# Patient Record
Sex: Female | Born: 1945
Health system: Southern US, Community
[De-identification: ages and names within clinical notes are randomized; demographics above are authoritative.]

## PROBLEM LIST (undated history)

## (undated) DIAGNOSIS — J449 Chronic obstructive pulmonary disease, unspecified: Secondary | ICD-10-CM

## (undated) DIAGNOSIS — M810 Age-related osteoporosis without current pathological fracture: Secondary | ICD-10-CM

## (undated) DIAGNOSIS — I10 Essential (primary) hypertension: Secondary | ICD-10-CM

## (undated) DIAGNOSIS — M26629 Arthralgia of temporomandibular joint, unspecified side: Secondary | ICD-10-CM

## (undated) HISTORY — DX: Age-related osteoporosis without current pathological fracture: M81.0

## (undated) HISTORY — DX: Essential (primary) hypertension: I10

## (undated) HISTORY — DX: Chronic obstructive pulmonary disease, unspecified: J44.9

## (undated) HISTORY — DX: Arthralgia of temporomandibular joint, unspecified side: M26.629

---

## 1998-08-21 ENCOUNTER — Other Ambulatory Visit: Admission: RE | Admit: 1998-08-21 | Discharge: 1998-08-21 | Payer: Self-pay

## 2005-02-18 ENCOUNTER — Other Ambulatory Visit: Admission: RE | Admit: 2005-02-18 | Discharge: 2005-02-18 | Payer: Self-pay | Admitting: Family Medicine

## 2006-03-30 ENCOUNTER — Ambulatory Visit: Payer: Self-pay | Admitting: Family Medicine

## 2006-04-01 ENCOUNTER — Ambulatory Visit: Payer: Self-pay | Admitting: Family Medicine

## 2006-04-21 ENCOUNTER — Ambulatory Visit: Payer: Self-pay | Admitting: Family Medicine

## 2006-04-23 ENCOUNTER — Ambulatory Visit: Payer: Self-pay | Admitting: Family Medicine

## 2006-05-18 ENCOUNTER — Ambulatory Visit: Payer: Self-pay | Admitting: Family Medicine

## 2006-06-25 ENCOUNTER — Ambulatory Visit: Payer: Self-pay | Admitting: Family Medicine

## 2006-07-23 ENCOUNTER — Ambulatory Visit: Payer: Self-pay | Admitting: Family Medicine

## 2006-08-31 ENCOUNTER — Ambulatory Visit: Payer: Self-pay | Admitting: Family Medicine

## 2006-10-01 ENCOUNTER — Ambulatory Visit: Payer: Self-pay | Admitting: Physician Assistant

## 2008-04-07 ENCOUNTER — Ambulatory Visit: Payer: Self-pay | Admitting: Cardiology

## 2008-04-18 ENCOUNTER — Ambulatory Visit: Payer: Self-pay | Admitting: Cardiology

## 2008-05-12 ENCOUNTER — Ambulatory Visit: Payer: Self-pay | Admitting: Cardiology

## 2011-04-08 NOTE — Assessment & Plan Note (Signed)
[facility]                          [facility]   NAME:[name]                     MRN:          [id number]  DATE:04/07/2008                            DOB:          [birth year]    REFERRING PHYSICIAN:  Delaney Meigs, M.D.   REQUESTING PHYSICIAN:  Dr. Delaney Meigs.   REASON FOR CONSULTATION:  Abnormal electrocardiogram and shortness of  breath.   HISTORY OF PRESENT ILLNESS:  Catherine Harrell is a pleasant 65 year old woman  with a reported history of chronic lung disease and longstanding history  of tobacco abuse.  She reports previous problems with allergies and also  recurrent pneumonia.  She apparently had lung function testing done a  few years ago, but nothing recently and reports being placed on  albuterol and Atrovent inhalers not that long ago, as well as receiving  a course of antibiotics.  She reports NYHA class II to, at times III,  dyspnea on exertion, but no chest tightness.  She has sporadic left  upper arm discomfort.  She does feel of a sense of relatively rapid  heart rate at rest, and this seems to be worse after she uses her  albuterol.  I do not have her actual electrocardiogram for review  prompting consultation, although a repeat tracing today shows sinus  tachycardia at 120 beats per minute with left atrial enlargement and  decreased R-wave progression, rule out anterior wall infarct.  Her heart  rate does actually settle down to around 100 after being seated for a  few minutes.  I see an old chest x-ray from 2007, revealing  hyperinflation, suggestive of obstructive pulmonary disease.  Non-  aneurysmal aortic calcification was also described on that study.  She  had some slight degree of apical pleural thickening as well.  She states  that she has not seen a pulmonologist.  She has had no dizziness, frank  orthopnea or PND.  She does have intermittent low extremity edema,  although states that  this is not particularly bothersome.  She has had  no other further cardiac evaluation.   ALLERGIES:  NO KNOWN DRUG ALLERGIES.   PRESENT MEDICATIONS:  Albuterol and Atrovent MDIs.   PAST MEDICAL HISTORY:  As outlined above.  The patient states that she  is disabled due to chronic lung disease.  She also has a history of  hearing loss and back problems.  She has been disabled since September  2006.   FAMILY HISTORY:  Was reviewed and is significant for cardiovascular  disease.  Also diabetes mellitus.   REVIEW OF SYSTEMS:  As outlined above.  She does have regular wheezing.  She is bothered by seasonal allergies.  She has occasional constipation  and arthric pain.  Otherwise systems are negative.   SOCIAL HISTORY:  The patient is married.  She has four children.  She  has a longstanding tobacco use history of 32 pack years, although quit  in September 2006.  Denies any alcohol use.  She states that she sleeps  on two pillows chronically.   PHYSICAL EXAMINATION:  VITAL SIGNS:  Blood  pressure 162/98, heart rate  is 112, down to 100.  Weight 131 pounds.  GENERAL:  This is a chronically ill appearing woman, looking older than  stated age.  HEENT:  Conjunctivae normal.  Oropharynx is clear.  NECK:  Supple.  No loud bruits or elevated jugulovenous pressure.  LUNGS:  Exhibit diminished breath sounds with end-expiratory wheezes and  prolonged expiratory phase.  Respiratory effort is not labored.  No use  of accessory muscles.  CARDIAC:  Reveals somewhat distant regular heart sounds.  Soft systolic  murmur.  There is a probable S4, although cannot exclude a summation  gallop with increased heart rate.  ABDOMEN:  Soft, no bruits.  No tenderness noted.  EXTREMITIES:  Exhibit trace edema.  Distal pulses are 1+.  SKIN:  Warm and dry.  MUSCULOSKELETAL:  No kyphosis is noted.  NEUROPSYCHIATRIC:  The patient is alert and oriented x3.  She has  significantly decreased hearing.  Affect is  appropriate.   IMPRESSION/RECOMMENDATIONS:  Longstanding dyspnea on exertion with  apparent chronic lung disease, presumably emphysema with a previous  longstanding tobacco use history.  This may well explain the patient's  poor R-wave progression on electrocardiogram, although the possibility  of coronary artery disease is also to be considered.  At this point, I  have recommended blood work, including a CBC and TSH, proceeding with  full pulmonary function tests to get a better sense of how significant  her lung disease is at baseline, obtain a PA and lateral chest x-ray,  and obtain a 2-D echocardiogram to assess left ventricular function and  exclude focal wall motion abnormalities.  We will then have her return  to the office to review these studies and discuss if any additional  evaluation is required.  Further plans to follow.     Jonelle Sidle, MD  Electronically Signed    SGM/MedQ  DD: 04/07/2008  DT: 04/07/2008  Job #: [id number]   cc:   Delaney Meigs, M.D.

## 2011-04-08 NOTE — Assessment & Plan Note (Signed)
[facility]                          [facility]   NAME:Catherine Harrell, Catherine Harrell                     MRN:          [id number]  DATE:05/12/2008                            DOB:          12-02-1945    PRIMARY CARE PHYSICIAN:  Delaney Meigs, M.D.   REASON FOR VISIT:  Followup testing.   HISTORY OF PRESENT ILLNESS:  I saw Ms. Noda back in May.  She has a  history of longstanding dyspnea on exertion of what appeared to be  fairly significant chronic lung disease based on her history and  previous substantial tobacco use.  She had an electrocardiogram showing  poor R-wave progression and we referred her for followup studies.  She  had an echocardiogram, which demonstrated normal left ventricular  systolic function with an ejection fraction of 60-65% and no wall motion  abnormalities.  I suspect that her decreased R-wave progression is  perhaps reflective of her underlying lung disease.  She does have  evidence of significant lung disease with pulmonary function test  demonstrating a severe obstructive lung defect.  She had a chest x-ray,  which was abnormal demonstrating an opacity in the lingula or superior  segment of left lower lobe and this was followed up with a CT scan of  the chest which revealed conglomerate scarring or bronchiectasis within  the lingula, which accounted for this opacity noted on the plain film.  There was bronchiectasis in the inferior right upper lobe and to a  lesser degree in the right middle lobe, minimally so in the left lower  lobe.  Findings were very consistent with chronic obstructive pulmonary  disease and emphysematous changes, but no acute process was noted.  Labs  were overall reassuring with normal BUN and creatinine of 8 and 0.8  respectively, normal TSH of 2.3, and a normal hemoglobin of 15.0.  I  discussed these with the patient today.  She continues to report  problems with congestion, although her  breathing has been better since I  last saw her.  We talked about referral for a formal followup with a  pulmonologist and she is in agreement.  I did not feel strongly about  pursuing any additional ischemic evaluation at this point.   ALLERGIES:  No known drug allergies.   PRESENT MEDICATIONS:  The patient uses ipratropium and albuterol p.r.n.  and also oxygen at night.   REVIEW OF SYSTEMS:  As per the history of present illness.  No dizziness  or syncope.   PHYSICAL EXAMINATION:  VITAL SIGNS:  Blood pressure is 146/98, heart  rate is 99, weight is 129 pounds, and oxygen saturation is 92% on room  air.  GENERAL:  The patient is in no acute distress.  Breathing is nonlabored.  NECK:  Reveals no elevated jugular venous pressure.  No loud bruits.  LUNGS:  With diminished breath sounds, slight expiratory wheezes, with  prolonged expiratory phase.  No use of accessory muscles.  CARDIAC:  Distant regular heart sounds, soft systolic murmur.  EXTREMITIES:  Exhibits trace edema with distal 1+ pulses.   IMPRESSION AND RECOMMENDATIONS:  Evidence  of chronic obstructive  pulmonary disease with a severe obstructive defect by pulmonary function  test.  She also has apparent scarring and bronchiectasis based on CT  scan findings as detailed above.  She has a longstanding history of  tobacco use and also previously worked in a [employer].  Fortunately,  her left ventricular ejection fraction is normal without wall motion  abnormalities and her blood work was largely reassuring.  I reviewed  these issues with the patient today and we will make a referral to Dr.  Orson Aloe to establish regular care of her chronic lung disease, which I  expect accounts for her dyspnea on exertion long-term.  At this  particular time, we did not plan any additional ischemic evaluation and  can see her back as needed.  It was mentioned in the CT scan report of  the chest that a follow up chest x-ray be considered in  3 months.  I  will leave this follow up to Dr. Donnamarie Poag discretion given his  expertise in this area.     Jonelle Sidle, MD  Electronically Signed    SGM/MedQ  DD: 05/12/2008  DT: 05/13/2008  Job #: [id number]   cc:   Delaney Meigs, M.D.

## 2014-01-09 ENCOUNTER — Institutional Professional Consult (permissible substitution): Payer: Self-pay | Admitting: Pulmonary Disease

## 2014-01-16 ENCOUNTER — Telehealth: Payer: Self-pay

## 2014-01-16 ENCOUNTER — Ambulatory Visit (INDEPENDENT_AMBULATORY_CARE_PROVIDER_SITE_OTHER): Payer: Commercial Managed Care - HMO | Admitting: Pulmonary Disease

## 2014-01-16 ENCOUNTER — Ambulatory Visit (INDEPENDENT_AMBULATORY_CARE_PROVIDER_SITE_OTHER)
Admission: RE | Admit: 2014-01-16 | Discharge: 2014-01-16 | Disposition: A | Discharge status: Home / Self Care | Payer: Commercial Managed Care - HMO | Source: Ambulatory Visit | Attending: Pulmonary Disease | Admitting: Pulmonary Disease

## 2014-01-16 ENCOUNTER — Encounter: Payer: Self-pay | Admitting: Pulmonary Disease

## 2014-01-16 VITALS — BP 142/84 | HR 112 | Ht 63.5 in | Wt 123.0 lb

## 2014-01-16 DIAGNOSIS — R0602 Shortness of breath: Secondary | ICD-10-CM

## 2014-01-16 DIAGNOSIS — R918 Other nonspecific abnormal finding of lung field: Secondary | ICD-10-CM

## 2014-01-16 DIAGNOSIS — J189 Pneumonia, unspecified organism: Secondary | ICD-10-CM

## 2014-01-16 DIAGNOSIS — J449 Chronic obstructive pulmonary disease, unspecified: Secondary | ICD-10-CM | POA: Diagnosis not present

## 2014-01-16 DIAGNOSIS — J4489 Other specified chronic obstructive pulmonary disease: Secondary | ICD-10-CM | POA: Diagnosis not present

## 2014-01-16 DIAGNOSIS — R9389 Abnormal findings on diagnostic imaging of other specified body structures: Secondary | ICD-10-CM

## 2014-01-16 NOTE — Telephone Encounter (Signed)
Spoke with patient-she is aware of results and aware of CT Chest ordered.

## 2014-01-16 NOTE — Telephone Encounter (Signed)
LMTCB X1, ct chest has been ordered.

## 2014-01-16 NOTE — Telephone Encounter (Signed)
Pt returned call.  Pt is hard of hearing.  You will need to speak up when talking w/ her.  Catherine FairyHolly D Pryor

## 2014-01-16 NOTE — Assessment & Plan Note (Signed)
I am concerned about this chest x-ray.  It's not clear to me if this finding is due to a recent pneumonia, pulmonary fibrosis, or a confluence of pulmonary nodules.  Given her smoking history, we need to better image these with a CT chest.  Plan: -CT Chest

## 2014-01-16 NOTE — Telephone Encounter (Signed)
Message copied by Velvet BatheAULFIELD, Lleyton Byers L on Mon Jan 16, 2014  4:45 PM ------      Message from: Max FickleMCQUAID, DOUGLAS B      Created: Mon Jan 16, 2014  4:40 PM       A,            Please let her know that the abnormalities on her chest X-ray have not improved and she now needs to have a CT chest.            Thanks      B ------

## 2014-01-16 NOTE — Progress Notes (Signed)
Subjective:    Patient ID: Catherine Harrell, female    DOB: [birth year], 68 y.o.   MRN: [id number]  HPI  01/16/2014> [name] was hospitalized the first week of January and was sent home with prednisone and the Levaquin.  Unfortunately she had swelling in her feet and ankles which she attributed to her medicine.  She continues to have some pain.    She was hospitalized due to anorexia, cough, and dyspnea. She received breathing treatments which didn't help too much.  She was hospitalized for about 4 days in [place] for a COPD flare.    She was diagnosed in 2006 which was the same year she quit smoking.  She has been on oxygen ever since leaving the hospital in January 2015.    For several years she has been having more trouble with dyspnea.  She occasionally chokes on food which makes her cough.    Since starting atravent she feels better.  She can walk about 200 yards before getting dyspnic.  She coughs fairly regularly, usually dark to yellow sputum.  She coughed up a little blood after her hospitalization in January.  None since.  No past medical history on file.   Family History  Problem Relation Age of Onset  . Heart disease Father   . Heart disease Sister   . Heart disease Brother   . Cancer Daughter     breast     History   Social History  . Marital Status: Unknown    Spouse Name: N/A    Number of Children: N/A  . Years of Education: N/A   Occupational History  . Not on file.   Social History Main Topics  . Smoking status: Former Smoker -- 1.00 packs/day for 30 years    Types: Cigarettes    Quit date: 01/16/2005  . Smokeless tobacco: Never Used  . Alcohol Use: No  . Drug Use: No  . Sexual Activity: Not on file   Other Topics Concern  . Not on file   Social History Narrative  . No narrative on file     Not on File   No outpatient prescriptions prior to visit.   No facility-administered medications prior to visit.      Review of Systems   Constitutional: Positive for unexpected weight change. Negative for fever.  HENT: Positive for sinus pressure. Negative for congestion, dental problem, ear pain, nosebleeds, postnasal drip, rhinorrhea, sneezing, sore throat and trouble swallowing.   Eyes: Negative for redness and itching.  Respiratory: Positive for cough and shortness of breath. Negative for chest tightness and wheezing.   Cardiovascular: Negative for palpitations and leg swelling.  Gastrointestinal: Negative for nausea and vomiting.  Genitourinary: Negative for dysuria.  Musculoskeletal: Positive for joint swelling.  Skin: Negative for rash.  Neurological: Negative for headaches.  Hematological: Does not bruise/bleed easily.  Psychiatric/Behavioral: Negative for dysphoric mood. The patient is not nervous/anxious.        Objective:   Physical Exam  Filed Vitals:   01/16/14 1419  BP: 142/84  Pulse: 112  Height: 5' 3.5" (1.613 m)  Weight: 123 lb (55.792 kg)  SpO2: 94%  2L [place]  Gen: chronically ill appearing, no acute distress HEENT: NCAT, PERRL, EOMi, OP clear, neck supple without masses PULM: few wheezes in bases CV: RRR, slight systolic murmur, no JVD AB: BS+, soft, nontender, no hsm Ext: warm, no edema, no clubbing, no cyanosis Derm: no rash or skin breakdown Neuro: A&Ox4, CN II-XII intact, strength 5/5 in  all 4 extremities  01/16/2014 CXR > multiple pulmonary nodules vs pulmonary fibrosis in upper lobes bilaterally      Assessment & Plan:   COPD (chronic obstructive pulmonary disease) COPD: GOLD Stage 3, GOLD Grade D Combined recommendations from the [employer], [employer] College of Chest Physicians, Designer, television/film setAmerican Thoracic Society, European Respiratory Society (Qaseem A et al, Ann Intern Med. 2011;155(3):179) recommends tobacco cessation, pulmonary rehab (for symptomatic patients with an FEV1 < 50% predicted), supplemental oxygen (for patients with SaO2 <88% or paO2 <55), and appropriate  bronchodilator therapy.  In regards to long acting bronchodilators, they recommend monotherapy (FEV1 60-80% with symptoms weak evidence, FEV1 with symptoms <60% strong evidence), or combination therapy (FEV1 <60% with symptoms, strong recommendation, moderate evidence).  One should also provide patients with annual immunizations and consider therapy for prevention of COPD exacerbations (ie. roflumilast or azithromycin) when appopriate.  -O2 therapy: Continue 2 L continuously -Immunizations: flu, pneumonia given 2014 -Tobacco use: quit 2006 -Exercise: encouraged regular exercise -Bronchodilator therapy: Add Breo, continue prn albuterol/atrovent -Exacerbation prevention: exercise   Abnormal CXR I am concerned about this chest x-ray.  It's not clear to me if this finding is due to a recent pneumonia, pulmonary fibrosis, or a confluence of pulmonary nodules.  Given her smoking history, we need to better image these with a CT chest.  Plan: -CT Chest    Updated Medication List Outpatient Encounter Prescriptions as of 01/16/2014  Medication Sig  . ipratropium (ATROVENT HFA) 17 MCG/ACT inhaler Inhale 2 puffs into the lungs every 6 (six) hours.  Marland Kitchen. ipratropium (ATROVENT) 0.02 % nebulizer solution Take 0.5 mg by nebulization 2 (two) times daily.  Docia Barrier. OXYGEN-HELIUM IN Inhale into the lungs. 2LPM 24/7

## 2014-01-16 NOTE — Assessment & Plan Note (Addendum)
COPD: GOLD Stage 3, GOLD Grade D Combined recommendations from the [employer] College of Physicians, [employer] College of Chest Physicians, Designer, television/film setAmerican Thoracic Society, European Respiratory Society (Qaseem A et al, Ann Intern Med. 2011;155(3):179) recommends tobacco cessation, pulmonary rehab (for symptomatic patients with an FEV1 < 50% predicted), supplemental oxygen (for patients with SaO2 <88% or paO2 <55), and appropriate bronchodilator therapy.  In regards to long acting bronchodilators, they recommend monotherapy (FEV1 60-80% with symptoms weak evidence, FEV1 with symptoms <60% strong evidence), or combination therapy (FEV1 <60% with symptoms, strong recommendation, moderate evidence).  One should also provide patients with annual immunizations and consider therapy for prevention of COPD exacerbations (ie. roflumilast or azithromycin) when appopriate.  -O2 therapy: Continue 2 L continuously -Immunizations: flu, pneumonia given 2014 -Tobacco use: quit 2006 -Exercise: encouraged regular exercise -Bronchodilator therapy: Add Breo, continue prn albuterol/atrovent -Exacerbation prevention: exercise

## 2014-01-16 NOTE — Patient Instructions (Addendum)
Take the Breo once a day no matter how you feel Keep using your other inhalers and oxygen as you are doing We will get a chest x-ray today to follow up the scarring in your lung We will see you back in 4 weeks or sooner if needed

## 2014-01-23 ENCOUNTER — Telehealth: Payer: Self-pay | Admitting: Pulmonary Disease

## 2014-01-23 MED ORDER — FLUTICASONE FUROATE-VILANTEROL 100-25 MCG/INH IN AEPB: 1.0000 | INHALATION_SPRAY | Freq: Every day | RESPIRATORY_TRACT | Status: DC

## 2014-01-23 NOTE — Telephone Encounter (Signed)
Patient needs her breo called in.  A sample was given at her last visit.  I have called this in.  Nothing further needed.

## 2014-01-26 ENCOUNTER — Ambulatory Visit (INDEPENDENT_AMBULATORY_CARE_PROVIDER_SITE_OTHER)
Admission: RE | Admit: 2014-01-26 | Discharge: 2014-01-26 | Disposition: A | Discharge status: Home / Self Care | Payer: Commercial Managed Care - HMO | Source: Ambulatory Visit | Attending: Pulmonary Disease | Admitting: Pulmonary Disease

## 2014-01-26 DIAGNOSIS — J189 Pneumonia, unspecified organism: Secondary | ICD-10-CM

## 2014-01-28 ENCOUNTER — Encounter: Payer: Self-pay | Admitting: Pulmonary Disease

## 2014-01-30 ENCOUNTER — Telehealth: Payer: Self-pay

## 2014-01-30 DIAGNOSIS — R9389 Abnormal findings on diagnostic imaging of other specified body structures: Secondary | ICD-10-CM

## 2014-01-30 NOTE — Telephone Encounter (Signed)
Message copied by Velvet BatheAULFIELD, Nachelle Negrette L on Mon Jan 30, 2014 12:46 PM ------      Message from: Lupita LeashMCQUAID, DOUGLAS B      Created: Sat Jan 28, 2014  9:19 AM       A,            Please let her know that I have reviewed her CT results which showed emphysema and possibly a chronic infection.  I would like for her to give us three sputum samples for AFB culture prior to the next visit.            Thanks      B ------

## 2014-01-30 NOTE — Telephone Encounter (Signed)
Spoke with pt, she is aware of results and recs.  She will be by sometime this week to pick up her sputum cups.  Orders for labs have been placed.  Nothing further needed.

## 2014-01-31 ENCOUNTER — Other Ambulatory Visit: Payer: Medicare HMO

## 2014-01-31 DIAGNOSIS — R9389 Abnormal findings on diagnostic imaging of other specified body structures: Secondary | ICD-10-CM

## 2014-02-16 ENCOUNTER — Ambulatory Visit (INDEPENDENT_AMBULATORY_CARE_PROVIDER_SITE_OTHER): Payer: Commercial Managed Care - HMO | Admitting: Pulmonary Disease

## 2014-02-16 ENCOUNTER — Encounter: Payer: Self-pay | Admitting: Pulmonary Disease

## 2014-02-16 VITALS — BP 132/76 | HR 99 | Ht 63.5 in | Wt 121.0 lb

## 2014-02-16 DIAGNOSIS — J449 Chronic obstructive pulmonary disease, unspecified: Secondary | ICD-10-CM

## 2014-02-16 NOTE — Assessment & Plan Note (Signed)
[facility]  Will continue this  Plan: -Breo daily -f/u 6 months

## 2014-02-16 NOTE — Assessment & Plan Note (Signed)
Her CT scan showed mild epmhysema but was suggestive of a chronic MAI infection.  This could explain her chronic bronchitis symptoms (in addition to COPD).  Plan: -AFB sputum culture x3

## 2014-02-16 NOTE — Addendum Note (Signed)
Addended by: Velvet BatheAULFIELD, Ralph Benavidez L on: 02/16/2014 02:08 PM   Modules accepted: Orders

## 2014-02-16 NOTE — Progress Notes (Signed)
   Subjective:    Patient ID: Catherine Harrell, female    DOB: 05/28/1946, 68 y.o.   MRN: [id number]  Synopsis: Very pleasant lady with COPD, severe (FEV1 35% pred), 2015 CT chest suggestive of MAI 01/16/2014 CXR > multiple pulmonary nodules vs pulmonary fibrosis in upper lobes bilaterally 01/2014 CT chest> mild emphysema, multiple pulmonary nodules worrisome for chronic MAI infection, 2 vessel coronary disease  HPI  02/16/2014 ROV >  Catherine Harrell says that she feel OK, and that the Catherine Harrell is helping.  She coughs up phlegm but this has improved on the Breo.  She coughs up mucus every day.  She thinks this is sinus related.  No fevers or chills.  Her weight has stayed the same. She feels like she has been eating a lot and is surprised that she hasn't gained weight.  She recently had gastroenteritis.  Past Medical History  Diagnosis Date  . COPD (chronic obstructive pulmonary disease)      Review of Systems     Objective:   Physical Exam Filed Vitals:   02/16/14 1328  BP: 132/76  Pulse: 99  Height: 5' 3.5" (1.613 m)  Weight: 121 lb (54.885 kg)  SpO2: 97%  2L [place]   Gen: chronically ill appearing, no acute distress HEENT: NCAT, OP clear, EOMi PULM: Wheezing bilaterally CV: RRR, no mgr, no JVD AB: BS+, soft, nontender, no hsm Ext: warm, no edema, no clubbing, no cyanosis      Assessment & Plan:   COPD (chronic obstructive pulmonary disease) She is doing well with the Breo inhaler daily.  Will continue this  Plan: -Breo daily -f/u 6 months  Abnormal CT scan, chest Her CT scan showed mild epmhysema but was suggestive of a chronic MAI infection.  This could explain her chronic bronchitis symptoms (in addition to COPD).  Plan: -AFB sputum culture x3    Updated Medication List Outpatient Encounter Prescriptions as of 02/16/2014  Medication Sig  . Fluticasone Furoate-Vilanterol (BREO ELLIPTA) 100-25 MCG/INH AEPB Inhale 1 puff into the lungs daily.  Marland Kitchen. ipratropium (ATROVENT)  0.02 % nebulizer solution Take 0.5 mg by nebulization 2 (two) times daily.  Docia Barrier. OXYGEN-HELIUM IN Inhale into the lungs. 2LPM 24/7  . ipratropium (ATROVENT HFA) 17 MCG/ACT inhaler Inhale 2 puffs into the lungs every 6 (six) hours.

## 2014-03-01 ENCOUNTER — Other Ambulatory Visit: Payer: Self-pay | Admitting: Pulmonary Disease

## 2014-03-01 ENCOUNTER — Other Ambulatory Visit: Payer: Medicare HMO

## 2014-03-01 DIAGNOSIS — J449 Chronic obstructive pulmonary disease, unspecified: Secondary | ICD-10-CM

## 2014-03-02 ENCOUNTER — Encounter: Payer: Self-pay | Admitting: Pulmonary Disease

## 2014-03-07 ENCOUNTER — Encounter: Payer: Self-pay | Admitting: Pulmonary Disease

## 2014-03-07 ENCOUNTER — Telehealth: Payer: Self-pay

## 2014-03-07 NOTE — Telephone Encounter (Signed)
Message copied by Velvet BatheAULFIELD, Nyasiah Moffet L on Tue Mar 07, 2014  5:22 PM ------      Message from: Max FickleMCQUAID, DOUGLAS B      Created: Tue Mar 07, 2014  2:45 PM       A,            Please let her know that her ONO was normal            Thanks      B ------

## 2014-03-07 NOTE — Telephone Encounter (Signed)
lmtcb X1 

## 2014-03-08 NOTE — Telephone Encounter (Signed)
Spoke to pt and husband to relay results.  Nothing further needed at this time.

## 2014-03-15 LAB — AFB CULTURE WITH SMEAR (NOT AT ARMC): Acid Fast Smear: NONE SEEN

## 2014-03-27 ENCOUNTER — Encounter: Payer: Self-pay | Admitting: Pulmonary Disease

## 2014-04-13 LAB — AFB CULTURE WITH SMEAR (NOT AT ARMC)
ACID FAST SMEAR: NONE SEEN
Acid Fast Smear: NONE SEEN

## 2014-04-14 ENCOUNTER — Telehealth: Payer: Self-pay | Admitting: Pulmonary Disease

## 2014-04-14 DIAGNOSIS — J449 Chronic obstructive pulmonary disease, unspecified: Secondary | ICD-10-CM

## 2014-04-14 LAB — AFB CULTURE WITH SMEAR (NOT AT ARMC): ACID FAST SMEAR: NONE SEEN

## 2014-04-14 NOTE — Telephone Encounter (Signed)
Notes Recorded by Lupita Leash, MD on 04/14/2014 at 3:18 PM A,  Please let her know that this was normal  Thanks B      I spoke with patient about results and she verbalized understanding and had no questions. Spouse aware as well. He also wants [place] to send an order to d/c O2 equipment since ONO showed she did not need it. Please advise if okay to do so or does pt need to hang on to it for now? Thanks

## 2014-04-14 NOTE — Progress Notes (Signed)
Quick Note:  lmtcb X1 to relay results. ______ 

## 2014-04-18 NOTE — Telephone Encounter (Signed)
Order placed to D/C O2 and to have all O2 picked up by [employer].  Nothing further needed.

## 2014-04-18 NOTE — Telephone Encounter (Signed)
LMOM x 1 

## 2014-04-18 NOTE — Telephone Encounter (Signed)
OK by me 

## 2014-04-21 NOTE — Progress Notes (Signed)
Quick Note:  lmtcb X1 ______ 

## 2014-04-24 NOTE — Progress Notes (Signed)
Quick Note:  lmtcb X2 ______ 

## 2014-07-25 ENCOUNTER — Other Ambulatory Visit: Payer: Self-pay

## 2014-07-25 MED ORDER — FLUTICASONE FUROATE-VILANTEROL 100-25 MCG/INH IN AEPB: 1.0000 | INHALATION_SPRAY | Freq: Every day | RESPIRATORY_TRACT | Status: DC

## 2014-08-07 DIAGNOSIS — I10 Essential (primary) hypertension: Secondary | ICD-10-CM | POA: Insufficient documentation

## 2014-08-21 ENCOUNTER — Ambulatory Visit (INDEPENDENT_AMBULATORY_CARE_PROVIDER_SITE_OTHER): Payer: Medicare HMO | Admitting: Pulmonary Disease

## 2014-08-21 ENCOUNTER — Encounter: Payer: Self-pay | Admitting: Pulmonary Disease

## 2014-08-21 VITALS — BP 120/60 | HR 89 | Ht 63.0 in | Wt 122.0 lb

## 2014-08-21 DIAGNOSIS — J438 Other emphysema: Secondary | ICD-10-CM

## 2014-08-21 DIAGNOSIS — R9389 Abnormal findings on diagnostic imaging of other specified body structures: Secondary | ICD-10-CM

## 2014-08-21 NOTE — Patient Instructions (Signed)
We will see you back in 6 months Get a flu shot next week Ask your PCP about Prevnar vaccine

## 2014-08-21 NOTE — Assessment & Plan Note (Signed)
She has changes on CT which appear to be consistent with MAI, but her sputum serology is negative and she does not have weight loss, night sweats, or chills to suggest active disease.  At this point there is no indication for treatment.

## 2014-08-21 NOTE — Progress Notes (Signed)
   Subjective:    Patient ID: Catherine Harrell, female    DOB: 1946-07-16, 68 y.o.   MRN: [id number]  Synopsis: Very pleasant lady with COPD, severe (FEV1 35% pred), 2015 CT chest suggestive of MAI 01/16/2014 CXR > multiple pulmonary nodules vs pulmonary fibrosis in upper lobes bilaterally 01/2014 CT chest> mild emphysema, multiple pulmonary nodules worrisome for chronic MAI infection, 2 vessel coronary disease  HPI  08/21/2014 ROV > Catherine Harrell has been doing well.  She says that her breathing is better.  She continues to take [facility] which helps a lot.  She continuus to cough up grey "smoky" mucus.  No hemoptysis, no fever or weight change.  She has not had a flu shot yet this year but she plans to have one next week with her PCP.   Past Medical History  Diagnosis Date  . COPD (chronic obstructive pulmonary disease)      Review of Systems      Objective:   Physical Exam  Filed Vitals:   08/21/14 1412  BP: 120/60  Pulse: 89  Height:  (1.6 m)  Weight: 122 lb (55.339 kg)  SpO2: 94%  2L [place]   Gen: chronically ill appearing, no acute distress HEENT: NCAT, OP clear, EOMi PULM: Wheezing bilaterally CV: RRR, no mgr, no JVD AB: BS+, soft, nontender, no hsm Ext: warm, no edema, no clubbing, no cyanosis      Assessment & Plan:   COPD (chronic obstructive pulmonary disease) This has been a stable interval for Catherine Harrell. She has some mild wheezing on exam but reports minimal symptoms.  Plan: -continue Breo daily -flu shot next week  Abnormal CT scan, chest She has changes on CT which appear to be consistent with MAI, but her sputum serology is negative and she does not have weight loss, night sweats, or chills to suggest active disease.  At this point there is no indication for treatment.    Updated Medication List Outpatient Encounter Prescriptions as of 08/21/2014  Medication Sig  . Fluticasone Furoate-Vilanterol (BREO ELLIPTA) 100-25 MCG/INH AEPB Inhale 1 puff into the  lungs daily.  Marland Kitchen gabapentin (NEURONTIN) 300 MG capsule Take 300 mg by mouth 3 (three) times daily.  Marland Kitchen ipratropium (ATROVENT) 0.02 % nebulizer solution Take 0.5 mg by nebulization 2 (two) times daily.  Marland Kitchen losartan (COZAAR) 25 MG tablet Take 25 mg by mouth daily.  Docia Barrier IN Inhale into the lungs. 2LPM 24/7  . [DISCONTINUED] ipratropium (ATROVENT HFA) 17 MCG/ACT inhaler Inhale 2 puffs into the lungs every 6 (six) hours.

## 2014-08-21 NOTE — Assessment & Plan Note (Signed)
This has been a stable interval for Catherine Harrell. She has some mild wheezing on exam but reports minimal symptoms.  Plan: -continue Breo daily -flu shot next week

## 2015-01-23 ENCOUNTER — Other Ambulatory Visit: Payer: Self-pay

## 2015-01-23 MED ORDER — FLUTICASONE FUROATE-VILANTEROL 100-25 MCG/INH IN AEPB: 1.0000 | INHALATION_SPRAY | Freq: Every day | RESPIRATORY_TRACT | Status: DC

## 2015-07-10 ENCOUNTER — Other Ambulatory Visit: Payer: Self-pay

## 2015-07-10 MED ORDER — FLUTICASONE FUROATE-VILANTEROL 100-25 MCG/INH IN AEPB: 1.0000 | INHALATION_SPRAY | Freq: Every day | RESPIRATORY_TRACT | Status: DC

## 2015-07-24 ENCOUNTER — Other Ambulatory Visit: Payer: Self-pay

## 2015-07-24 MED ORDER — FLUTICASONE FUROATE-VILANTEROL 100-25 MCG/INH IN AEPB: 1.0000 | INHALATION_SPRAY | Freq: Every day | RESPIRATORY_TRACT | Status: DC

## 2015-10-23 ENCOUNTER — Other Ambulatory Visit: Payer: Self-pay | Admitting: Pulmonary Disease

## 2015-12-19 ENCOUNTER — Ambulatory Visit (INDEPENDENT_AMBULATORY_CARE_PROVIDER_SITE_OTHER): Payer: Commercial Managed Care - HMO

## 2015-12-19 ENCOUNTER — Ambulatory Visit (INDEPENDENT_AMBULATORY_CARE_PROVIDER_SITE_OTHER): Payer: Commercial Managed Care - HMO | Admitting: Family Medicine

## 2015-12-19 ENCOUNTER — Encounter: Payer: Self-pay | Admitting: Family Medicine

## 2015-12-19 VITALS — BP 156/85 | HR 102 | Temp 97.0°F | Ht 62.5 in | Wt 131.2 lb

## 2015-12-19 DIAGNOSIS — J438 Other emphysema: Secondary | ICD-10-CM

## 2015-12-19 LAB — POCT URINALYSIS DIPSTICK
Bilirubin, UA: NEGATIVE
Blood, UA: NEGATIVE
Glucose, UA: NEGATIVE
Ketones, UA: NEGATIVE
LEUKOCYTES UA: NEGATIVE
Nitrite, UA: NEGATIVE
PROTEIN UA: NEGATIVE
Spec Grav, UA: 1.015
Urobilinogen, UA: NEGATIVE
pH, UA: 6

## 2015-12-19 MED ORDER — FLUTICASONE FUROATE-VILANTEROL 200-25 MCG/INH IN AEPB: 1.0000 | INHALATION_SPRAY | Freq: Every day | RESPIRATORY_TRACT | Status: DC

## 2015-12-19 MED ORDER — LOSARTAN POTASSIUM 25 MG PO TABS: 25.0000 mg | ORAL_TABLET | Freq: Every day | ORAL | Status: DC

## 2015-12-19 MED ORDER — IPRATROPIUM BROMIDE 0.02 % IN SOLN: 2.5000 mL | Freq: Two times a day (BID) | RESPIRATORY_TRACT | Status: DC

## 2015-12-19 MED ORDER — PREDNISONE 5 MG PO TABS: 5.0000 mg | ORAL_TABLET | Freq: Every day | ORAL | Status: DC

## 2015-12-19 MED ORDER — FLUTICASONE FUROATE-VILANTEROL 100-25 MCG/INH IN AEPB: 1.0000 | INHALATION_SPRAY | Freq: Every day | RESPIRATORY_TRACT | Status: DC

## 2015-12-19 NOTE — Progress Notes (Signed)
Subjective:  Patient ID: Catherine Harrell, female    DOB: Sep 20, 1946  Age: 70 y.o. MRN: [id number]  CC: Establish Care   HPI Catherine Harrell presents for COPD. Oxygen dependant. Ongoing for several years. Worsening Harrell by Harrell. Currently stable. Needs O2 supplier. Chronic cough with morning sputum. She denies cardiac or stroke history. She has chronic dyspnea controlled by meds, plus O2.   Recently, over last week cough, congestion and dyspnea have increased. No fever. Sputum  Now purulent. More copious.   History Catherine Harrell has a past medical history of COPD (chronic obstructive pulmonary disease) ([place]).   She has no past surgical history on file.   Her family history includes Cancer in her daughter; Heart disease in her brother, father, and sister.She reports that she quit smoking about 10 years ago. Her smoking use included Cigarettes. She has a 30 pack-year smoking history. She has never used smokeless tobacco. She reports that she does not drink alcohol or use illicit drugs.  Current Outpatient Prescriptions on File Prior to Visit  Medication Sig Dispense Refill  . gabapentin (NEURONTIN) 300 MG capsule Take 300 mg by mouth 3 (three) times daily.     No current facility-administered medications on file prior to visit.    ROS Review of Systems  Constitutional: Negative for fever, activity change and appetite change.  HENT: Negative for congestion, rhinorrhea and sore throat.   Eyes: Negative for visual disturbance.  Respiratory: Positive for cough, chest tightness, shortness of breath and wheezing.   Cardiovascular: Negative for chest pain and palpitations.  Gastrointestinal: Negative for nausea, abdominal pain and diarrhea.  Genitourinary: Negative for dysuria.  Musculoskeletal: Negative for myalgias and arthralgias.    Objective:  BP 156/85 mmHg  Pulse 102  Temp(Src) 97 F (36.1 C) (Oral)  Ht 5' 2.5" (1.588 m)  Wt 131 lb 3.2 oz (59.512 kg)  BMI 23.60 kg/m2   SpO2 95%  Physical Exam  Constitutional: She is oriented to person, place, and time. She appears well-developed and well-nourished. No distress.  HENT:  Head: Normocephalic and atraumatic.  Right Ear: External ear normal.  Left Ear: External ear normal.  Nose: Nose normal.  Mouth/Throat: Oropharynx is clear and moist.  Eyes: Conjunctivae and EOM are normal. Pupils are equal, round, and reactive to light.  Neck: Normal range of motion. Neck supple. No thyromegaly present.  Cardiovascular: Normal rate, regular rhythm and normal heart sounds.   No murmur heard. Pulmonary/Chest: She is in respiratory distress (mild). She has wheezes. She has rales.  Abdominal: Soft. Bowel sounds are normal. She exhibits no distension. There is no tenderness.  Lymphadenopathy:    She has no cervical adenopathy.  Neurological: She is alert and oriented to person, place, and time. She has normal reflexes.  Skin: Skin is warm and dry.  Psychiatric: She has a normal mood and affect. Her behavior is normal. Judgment and thought content normal.    Assessment & Plan:   Catherine Harrell was seen today for establish care.  Diagnoses and all orders for this visit:  Other emphysema ([place]) -     Spirometry: Peak -     CBC with Differential/Platelet -     CMP14+EGFR -     POCT urinalysis dipstick -     DG Chest 2 View; Future -     PR BREATHING CAPACITY TEST  Other orders -     Discontinue: Fluticasone Furoate-Vilanterol (BREO ELLIPTA) 100-25 MCG/INH AEPB; Inhale 1 puff into the lungs daily. -  losartan (COZAAR) 25 MG tablet; Take 1 tablet (25 mg total) by mouth daily. -     Discontinue: ipratropium (ATROVENT) 0.02 % nebulizer solution; Take 2.5 mLs (0.5 mg total) by nebulization 2 (two) times daily. -     ipratropium (ATROVENT) 0.02 % nebulizer solution; Take 2.5 mLs (0.5 mg total) by nebulization 2 (two) times daily. -     predniSONE (DELTASONE) 5 MG tablet; Take 1 tablet (5 mg total) by mouth daily with  breakfast. -     Fluticasone Furoate-Vilanterol (BREO ELLIPTA) 200-25 MCG/INH AEPB; Inhale 1 Dose into the lungs daily.  PFTR Confirms severe obstruction I have discontinued Catherine Harrell's OXYGEN-HELIUM IN, Fluticasone Furoate-Vilanterol, and Fluticasone Furoate-Vilanterol. I have also changed her losartan. Additionally, I am having her start on predniSONE and Fluticasone Furoate-Vilanterol. Lastly, I am having her maintain her gabapentin and ipratropium.  Meds ordered this encounter  Medications  . DISCONTD: Fluticasone Furoate-Vilanterol (BREO ELLIPTA) 100-25 MCG/INH AEPB    Sig: Inhale 1 puff into the lungs daily.    Dispense:  60 each    Refill:  5    Pt needs office visit for future refills.  Thanks!  Marland Kitchen losartan (COZAAR) 25 MG tablet    Sig: Take 1 tablet (25 mg total) by mouth daily.    Dispense:  90 tablet    Refill:  1  . DISCONTD: ipratropium (ATROVENT) 0.02 % nebulizer solution    Sig: Take 2.5 mLs (0.5 mg total) by nebulization 2 (two) times daily.    Dispense:  150 mL    Refill:  5  . ipratropium (ATROVENT) 0.02 % nebulizer solution    Sig: Take 2.5 mLs (0.5 mg total) by nebulization 2 (two) times daily.    Dispense:  150 mL    Refill:  5  . predniSONE (DELTASONE) 5 MG tablet    Sig: Take 1 tablet (5 mg total) by mouth daily with breakfast.    Dispense:  30 tablet    Refill:  1  . Fluticasone Furoate-Vilanterol (BREO ELLIPTA) 200-25 MCG/INH AEPB    Sig: Inhale 1 Dose into the lungs daily.    Dispense:  60 each    Refill:  5    Replaces previos scrip for lower strength. Thanks, WS     Follow-up: Return in about 6 weeks (around 01/30/2016) for COPD.  Claretta Fraise, M.D.

## 2015-12-20 LAB — CMP14+EGFR
A/G RATIO: 1.5 (ref 1.1–2.5)
ALBUMIN: 4.7 g/dL (ref 3.5–4.8)
ALT: 8 IU/L (ref 0–32)
AST: 13 IU/L (ref 0–40)
Alkaline Phosphatase: 100 IU/L (ref 39–117)
BILIRUBIN TOTAL: 0.3 mg/dL (ref 0.0–1.2)
BUN/Creatinine Ratio: 12 (ref 11–26)
BUN: 9 mg/dL (ref 8–27)
CALCIUM: 10.4 mg/dL — AB (ref 8.7–10.3)
CHLORIDE: 97 mmol/L (ref 96–106)
CO2: 25 mmol/L (ref 18–29)
Creatinine, Ser: 0.74 mg/dL (ref 0.57–1.00)
GFR calc non Af Amer: 82 mL/min/{1.73_m2} (ref >59)
GFR, EST AFRICAN AMERICAN: 94 mL/min/{1.73_m2} (ref >59)
Globulin, Total: 3.2 g/dL (ref 1.5–4.5)
Glucose: 100 mg/dL — ABNORMAL HIGH (ref 65–99)
Potassium: 4 mmol/L (ref 3.5–5.2)
SODIUM: 140 mmol/L (ref 134–144)
Total Protein: 7.9 g/dL (ref 6.0–8.5)

## 2015-12-20 LAB — CBC WITH DIFFERENTIAL/PLATELET
BASOS: 1 %
Basophils Absolute: 0.1 10*3/uL (ref 0.0–0.2)
EOS (ABSOLUTE): 0.5 10*3/uL — AB (ref 0.0–0.4)
EOS: 5 %
Hematocrit: 42.6 % (ref 34.0–46.6)
Hemoglobin: 14.1 g/dL (ref 11.1–15.9)
Immature Grans (Abs): 0 10*3/uL (ref 0.0–0.1)
Immature Granulocytes: 0 %
LYMPHS: 23 %
Lymphocytes Absolute: 2.2 10*3/uL (ref 0.7–3.1)
MCH: 29.1 pg (ref 26.6–33.0)
MCHC: 33.1 g/dL (ref 31.5–35.7)
MCV: 88 fL (ref 79–97)
Monocytes Absolute: 0.7 10*3/uL (ref 0.1–0.9)
Monocytes: 7 %
NEUTROS ABS: 6.1 10*3/uL (ref 1.4–7.0)
Neutrophils: 64 %
Platelets: 386 10*3/uL — ABNORMAL HIGH (ref 150–379)
RBC: 4.85 x10E6/uL (ref 3.77–5.28)
RDW: 13.2 % (ref 12.3–15.4)
WBC: 9.5 10*3/uL (ref 3.4–10.8)

## 2015-12-25 ENCOUNTER — Encounter: Payer: Self-pay | Admitting: *Deleted

## 2016-01-03 ENCOUNTER — Ambulatory Visit: Payer: Self-pay | Admitting: Pediatrics

## 2016-01-04 ENCOUNTER — Ambulatory Visit: Payer: Self-pay | Admitting: Family Medicine

## 2016-01-28 ENCOUNTER — Ambulatory Visit (INDEPENDENT_AMBULATORY_CARE_PROVIDER_SITE_OTHER): Payer: Commercial Managed Care - HMO | Admitting: Family Medicine

## 2016-01-28 ENCOUNTER — Encounter: Payer: Self-pay | Admitting: Family Medicine

## 2016-01-28 VITALS — BP 159/86 | HR 101 | Temp 96.8°F | Ht 62.5 in | Wt 132.6 lb

## 2016-01-28 DIAGNOSIS — J411 Mucopurulent chronic bronchitis: Secondary | ICD-10-CM

## 2016-01-28 MED ORDER — PREDNISONE 5 MG PO TABS: ORAL_TABLET | ORAL | Status: DC

## 2016-01-28 NOTE — Progress Notes (Signed)
Subjective:  Patient ID: Catherine Harrell, female    DOB: 03/31/1946  Age: 70 y.o. MRN: [id number]  CC: COPD   HPI Catherine Harrell presents for Using breo, neb TID & Prednisone. Still dyspneic with exertion. Gets palpitations when dyspneic.Daily productive cough. CT of 09/07/2014 shows chronic sinusitis. Not sure if productivity is from drainage or from the lungs. However, is dyspneic daily for any moderate or more strenuous activity.   Also left facial pain. Dx by CT as TMJ. Occurs daily.May happen once or several times lasting several minutes to  1/2 hour. Pain up to 8-9/10. Involves left face. Radiates from preauricular area to the central face, forehead and chin.  History Catherine Harrell has a past medical history of COPD (chronic obstructive pulmonary disease) ([facility]).   She has no past surgical history on file.   Her family history includes Cancer in her daughter; Heart disease in her brother, father, and sister.She reports that she quit smoking about 11 years ago. Her smoking use included Cigarettes. She has a 30 pack-year smoking history. She has never used smokeless tobacco. She reports that she does not drink alcohol or use illicit drugs.    ROS Review of Systems  Constitutional: Negative for fever, activity change and appetite change.  HENT: Negative for congestion, rhinorrhea and sore throat.   Eyes: Negative for visual disturbance.  Respiratory: Positive for cough and shortness of breath.   Cardiovascular: Negative for chest pain and palpitations.  Gastrointestinal: Negative for nausea, abdominal pain and diarrhea.  Genitourinary: Negative for dysuria.  Musculoskeletal: Positive for arthralgias. Negative for myalgias.    Objective:  BP 159/86 mmHg  Pulse 101  Temp(Src) 96.8 F (36 C) (Oral)  Ht 5' 2.5" (1.588 m)  Wt 132 lb 9.6 oz (60.147 kg)  BMI 23.85 kg/m2  SpO2 98%  BP Readings from Last 3 Encounters:  01/28/16 159/86  12/19/15 156/85  08/21/14 120/60    Wt  Readings from Last 3 Encounters:  01/28/16 132 lb 9.6 oz (60.147 kg)  12/19/15 131 lb 3.2 oz (59.512 kg)  08/21/14 122 lb (55.339 kg)     Physical Exam  Constitutional: She is oriented to person, place, and time. She appears well-developed and well-nourished. No distress.  HENT:  Head: Normocephalic and atraumatic.  Right Ear: External ear normal.  Left Ear: External ear normal.  Nose: Nose normal.  Mouth/Throat: Oropharynx is clear and moist.  Eyes: Conjunctivae and EOM are normal. Pupils are equal, round, and reactive to light.  Neck: Normal range of motion. Neck supple. No thyromegaly present.  Cardiovascular: Normal rate, regular rhythm and normal heart sounds.   No murmur heard. Pulmonary/Chest: Effort normal and breath sounds normal. No respiratory distress. She has no wheezes. She has no rales.  Abdominal: Soft. Bowel sounds are normal. She exhibits no distension. There is no tenderness.  Lymphadenopathy:    She has no cervical adenopathy.  Neurological: She is alert and oriented to person, place, and time. She has normal reflexes.  Skin: Skin is warm and dry.  Psychiatric: She has a normal mood and affect. Her behavior is normal. Judgment and thought content normal.     Lab Results  Component Value Date   WBC 9.5 12/19/2015   HCT 42.6 12/19/2015   PLT 386* 12/19/2015   GLUCOSE 100* 12/19/2015   ALT 8 12/19/2015   AST 13 12/19/2015   NA 140 12/19/2015   K 4.0 12/19/2015   CL 97 12/19/2015   CREATININE 0.74 12/19/2015   BUN 9  12/19/2015   CO2 25 12/19/2015    Ct Chest Wo Contrast  01/26/2014  CLINICAL DATA:  Followup evaluation for pneumonia. EXAM: CT CHEST WITHOUT CONTRAST TECHNIQUE: Multidetector CT imaging of the chest was performed following the standard protocol without IV contrast. COMPARISON:  Chest x-ray 01/16/2014. FINDINGS: Mediastinum: Heart size is normal. There is no significant pericardial fluid, thickening or pericardial calcification. There is  atherosclerosis of the thoracic aorta, the great vessels of the mediastinum and the coronary arteries, including calcified atherosclerotic plaque in the left main, left anterior descending and right coronary arteries. Numerous borderline enlarged and mildly enlarged mediastinal and hilar lymph nodes, largest of which measures 1.3 cm in short axis in the lower right paratracheal station. Esophagus is unremarkable in appearance. Lungs/Pleura: Diffuse bronchial wall thickening. Scattered areas of mild cylindrical bronchiectasis. Extensive peribronchovascular micro and macronodularity noted throughout all aspects of the lungs bilaterally, with a slight upper lung predominance, as well as extensive scarring, architectural distortion and chronic volume loss in both the inferior segment of the lingula and in the right middle lobe (the right middle lobe there is essentially complete chronic atelectasis). No pleural effusions. Mild centrilobular emphysema. Upper Abdomen: Numerous calcified granulomas throughout the spleen. Calcified granuloma in the left lobe of the liver. Musculoskeletal: There are no aggressive appearing lytic or blastic lesions noted in the visualized portions of the skeleton. IMPRESSION: 1. The appearance of the lungs is most compatible with a chronic indolent atypical infectious process such as MAI (mycobacterium avium intracellulare), as detailed above. Mediastinal and hilar lymph nodes are presumably reactive. 2. Atherosclerosis, including left main and 2 vessel coronary artery disease. Assessment for potential risk factor modification, dietary therapy or pharmacologic therapy may be warranted, if clinically indicated. 3. Mild centrilobular emphysema. 4. Sequela of old granulomatous disease in the liver and spleen. Electronically Signed   By: Trudie Reedaniel  Entrikin M.D.   On: 01/26/2014 16:00    Assessment & Plan:   Catherine Harrell was seen today for copd.  Diagnoses and all orders for this  visit:  Mucopurulent chronic bronchitis (HCC)  Other orders -     predniSONE (DELTASONE) 5 MG tablet; Take 2 daily for two weeks then resume 1 daily    I have discontinued Ms. Mukherjee's gabapentin and gabapentin. I have also changed her predniSONE. Additionally, I am having her maintain her losartan, ipratropium, and fluticasone furoate-vilanterol.  Meds ordered this encounter  Medications  . DISCONTD: gabapentin (NEURONTIN) 600 MG tablet    Sig: Take 600 mg by mouth 3 (three) times daily. But is taking at bedtime to help sleep d/t TMJ  . predniSONE (DELTASONE) 5 MG tablet    Sig: Take 2 daily for two weeks then resume 1 daily    Dispense:  45 tablet    Refill:  2     Follow-up: Return in about 3 months (around 04/29/2016), or if symptoms worsen or fail to improve.  Mechele ClaudeWarren Cordai Rodrigue, M.D.

## 2016-01-28 NOTE — Patient Instructions (Signed)
Thank you for allowing us to care for you today. We strive to provide exceptional quality and compassionate care. Please let us know how we are doing and how we can help serve you better by filling out the survey that you receive from [employer].     

## 2016-01-30 ENCOUNTER — Ambulatory Visit: Payer: Commercial Managed Care - HMO | Admitting: Family Medicine

## 2016-02-27 DIAGNOSIS — R0602 Shortness of breath: Secondary | ICD-10-CM | POA: Diagnosis not present

## 2016-02-27 DIAGNOSIS — J440 Chronic obstructive pulmonary disease with acute lower respiratory infection: Secondary | ICD-10-CM | POA: Diagnosis not present

## 2016-02-27 DIAGNOSIS — Z87891 Personal history of nicotine dependence: Secondary | ICD-10-CM | POA: Diagnosis not present

## 2016-02-27 DIAGNOSIS — J441 Chronic obstructive pulmonary disease with (acute) exacerbation: Secondary | ICD-10-CM | POA: Diagnosis not present

## 2016-02-27 DIAGNOSIS — Z7952 Long term (current) use of systemic steroids: Secondary | ICD-10-CM | POA: Diagnosis not present

## 2016-02-27 DIAGNOSIS — J189 Pneumonia, unspecified organism: Secondary | ICD-10-CM | POA: Diagnosis not present

## 2016-02-27 DIAGNOSIS — R05 Cough: Secondary | ICD-10-CM | POA: Diagnosis not present

## 2016-02-27 DIAGNOSIS — J439 Emphysema, unspecified: Secondary | ICD-10-CM | POA: Diagnosis not present

## 2016-02-27 DIAGNOSIS — R0902 Hypoxemia: Secondary | ICD-10-CM | POA: Diagnosis not present

## 2016-02-27 DIAGNOSIS — R0682 Tachypnea, not elsewhere classified: Secondary | ICD-10-CM | POA: Diagnosis not present

## 2016-02-27 DIAGNOSIS — R000 Tachycardia, unspecified: Secondary | ICD-10-CM | POA: Diagnosis not present

## 2016-02-27 DIAGNOSIS — E871 Hypo-osmolality and hyponatremia: Secondary | ICD-10-CM | POA: Diagnosis not present

## 2016-02-27 DIAGNOSIS — Z79899 Other long term (current) drug therapy: Secondary | ICD-10-CM | POA: Diagnosis not present

## 2016-02-27 DIAGNOSIS — E876 Hypokalemia: Secondary | ICD-10-CM | POA: Diagnosis not present

## 2016-03-04 ENCOUNTER — Ambulatory Visit (INDEPENDENT_AMBULATORY_CARE_PROVIDER_SITE_OTHER): Payer: Commercial Managed Care - HMO | Admitting: Family Medicine

## 2016-03-04 ENCOUNTER — Ambulatory Visit: Payer: Commercial Managed Care - HMO | Admitting: Family Medicine

## 2016-03-04 ENCOUNTER — Encounter: Payer: Self-pay | Admitting: Family Medicine

## 2016-03-04 VITALS — BP 146/73 | HR 95 | Temp 97.0°F | Ht 62.5 in | Wt 130.0 lb

## 2016-03-04 DIAGNOSIS — J441 Chronic obstructive pulmonary disease with (acute) exacerbation: Secondary | ICD-10-CM

## 2016-03-04 DIAGNOSIS — R300 Dysuria: Secondary | ICD-10-CM | POA: Diagnosis not present

## 2016-03-04 DIAGNOSIS — R197 Diarrhea, unspecified: Secondary | ICD-10-CM | POA: Diagnosis not present

## 2016-03-04 DIAGNOSIS — J9601 Acute respiratory failure with hypoxia: Secondary | ICD-10-CM

## 2016-03-04 DIAGNOSIS — J189 Pneumonia, unspecified organism: Secondary | ICD-10-CM | POA: Diagnosis not present

## 2016-03-04 LAB — URINALYSIS, COMPLETE
Bilirubin, UA: NEGATIVE
Glucose, UA: NEGATIVE
Ketones, UA: NEGATIVE
LEUKOCYTES UA: NEGATIVE
Nitrite, UA: NEGATIVE
PH UA: 7 (ref 5.0–7.5)
Protein, UA: NEGATIVE
RBC UA: NEGATIVE
Specific Gravity, UA: 1.01 (ref 1.005–1.030)
Urobilinogen, Ur: 0.2 mg/dL (ref 0.2–1.0)

## 2016-03-04 LAB — MICROSCOPIC EXAMINATION
BACTERIA UA: NONE SEEN
Epithelial Cells (non renal): >10 /hpf — AB (ref 0–10)

## 2016-03-04 NOTE — Progress Notes (Signed)
Subjective:    Patient ID: Catherine Harrell, female    DOB: [birth year], 70 y.o.   MRN: [id number]  HPI Patient here today for medication problem. She was discharged from [facility] on 02/29/16 with primary diagnosis of influenza. Today she comes in with dysuria and diarrhea which she thinks is caused from the 2 antibiotics that was given at discharge. She has a Hospital follow up scheduled with her primary provider, Dr Livia Snellen on Monday 03/10/16. The patient says she is actually feeling better today than she was when she went into the hospital. She was having loose bowel movements at that time also. She developed more trouble breathing and was found to have an acute exacerbation of her chronic obstructive pulmonary disease. She also had pneumonia hyponatremia as well as hypoxia. In the hospital she received a CT scan and there was some concern about hilar adenopathy. She also had a chest x-ray which showed pneumonia. She is almost completed her round of antibiotics which include azithromycin and Augmentin and also a course of prednisone. She's been taking Tessalon Perles. She has been drinking a lot of caffeine during the day including coffee in the morning. The patient's hospital discharge summary chest x-rays and CT scans were reviewed during this visit. The patient has had about 2 loose bowel movements a day. This is in the morning and at night. She says the congestion is clearing up that used to be colored.     Patient Active Problem List   Diagnosis Date Noted  . COPD (chronic obstructive pulmonary disease) ([place]) 01/16/2014  . Abnormal CT scan, chest 01/16/2014   Outpatient Encounter Prescriptions as of 03/04/2016  Medication Sig  . amoxicillin (AMOXIL) 875 MG tablet   . azithromycin (ZITHROMAX) 250 MG tablet TAKE 2 TABLETS BY MOUTH TODAY, THEN TAKE 1 TABLET DAILY FOR 4 DAYS  . benzonatate (TESSALON) 100 MG capsule TAKE ONE CAPSULE BY MOUTH 3 TIMES A DAY AS NEEDED  . Fluticasone  Furoate-Vilanterol (BREO ELLIPTA) 200-25 MCG/INH AEPB Inhale 1 Dose into the lungs daily.  Marland Kitchen ipratropium (ATROVENT) 0.02 % nebulizer solution Take 2.5 mLs (0.5 mg total) by nebulization 2 (two) times daily.  Marland Kitchen losartan (COZAAR) 25 MG tablet Take 1 tablet (25 mg total) by mouth daily.  . methylPREDNISolone (MEDROL DOSEPAK) 4 MG TBPK tablet See admin instructions.  . predniSONE (DELTASONE) 5 MG tablet Take 2 daily for two weeks then resume 1 daily (Patient not taking: Reported on 03/04/2016)   No facility-administered encounter medications on file as of 03/04/2016.     Review of Systems  Constitutional: Negative.   HENT: Negative.   Eyes: Negative.   Respiratory: Negative.   Cardiovascular: Negative.   Gastrointestinal: Positive for diarrhea.  Endocrine: Negative.   Genitourinary: Positive for dysuria.  Musculoskeletal: Negative.   Skin: Negative.   Allergic/Immunologic: Negative.   Neurological: Negative.   Hematological: Negative.   Psychiatric/Behavioral: Negative.        Objective:   Physical Exam  Constitutional: She is oriented to person, place, and time.  The patient is alert but very hard of hearing and using portable oxygen. Her pulse ox is 95% when she came to the visit using the oxygen. She appears somewhat dyspneic.  HENT:  Head: Normocephalic and atraumatic.  Right Ear: External ear normal.  Left Ear: External ear normal.  Nose: Nose normal.  Mouth/Throat: Oropharynx is clear and moist.  Eyes: Conjunctivae and EOM are normal. Pupils are equal, round, and reactive to light. Right eye exhibits no  discharge. Left eye exhibits no discharge. No scleral icterus.  Neck: Normal range of motion. Neck supple. No thyromegaly present.  Cardiovascular: Normal rate, regular rhythm and normal heart sounds.   No murmur heard. The heart rate is about 96/m but regular.  Pulmonary/Chest: She is in respiratory distress. She has wheezes. She has rales. She exhibits no tenderness.  The  patient has expiratory wheezes and rhonchi.  Abdominal: Soft. Bowel sounds are normal. There is no tenderness. There is no rebound and no guarding.  Musculoskeletal: Normal range of motion. She exhibits edema. She exhibits no tenderness.  There is some slight pedal edema.  Lymphadenopathy:    She has no cervical adenopathy.  Neurological: She is alert and oriented to person, place, and time.  She has a lot of trouble hearing  Skin: Skin is warm and dry. No rash noted.  Psychiatric: She has a normal mood and affect. Her behavior is normal. Judgment and thought content normal.  Nursing note and vitals reviewed.   BP 146/73 mmHg  Pulse 95  Temp(Src) 97 F (36.1 C) (Oral)  Ht 5' 2.5" (1.588 m)  Wt 130 lb (58.968 kg)  BMI 23.38 kg/m2  SpO2 95%       Assessment & Plan:  1. Dysuria -The urinalysis result is pending - Urinalysis, Complete - Urine culture  2. Diarrhea, unspecified type -The patient was encouraged to continue and complete the antibiotics that she has been started on and she has about 3 more days worth of Augmentin and one more day worth of azithromycin. She asked he says the loose stools are improved from what they were before she went into the hospital. -Decrease caffeine intake, but continue to drink plenty of liquids - CBC with Differential/Platelet - BMP8+EGFR  3. Acute exacerbation of chronic obstructive pulmonary disease (COPD) ([place]) -She remains fairly congested and is using a Brio inhaler at home along with nebulizer treatment twice daily.  4. CAP (community acquired pneumonia) -A chest x-ray will need to be repeated at some point in the future -She may also need a follow-up CT scan because of the hilar adenopathy that showed up from the CT scan in the hospital.  5. Acute respiratory failure with hypoxia ([place]) -The patient is improving though still very congested. -Continue with oxygen -Continue and complete antibiotics -Decrease caffeine intake  Meds  ordered this encounter  Medications  . amoxicillin (AMOXIL) 875 MG tablet    Sig:   . azithromycin (ZITHROMAX) 250 MG tablet    Sig: TAKE 2 TABLETS BY MOUTH TODAY, THEN TAKE 1 TABLET DAILY FOR 4 DAYS    Refill:  0  . benzonatate (TESSALON) 100 MG capsule    Sig: TAKE ONE CAPSULE BY MOUTH 3 TIMES A DAY AS NEEDED    Refill:  0  . methylPREDNISolone (MEDROL DOSEPAK) 4 MG TBPK tablet    Sig: See admin instructions.    Refill:  0   Patient Instructions  Drink Clear fluids - water, sprite, 7 up Decrease caffeine Finish all prescribed medications and follow up on 03/10/16 with Dr Livia Snellen Continue with cough medicine as doing Take Tylenol as needed for aches pains and fever Continue with inhalers and with home breathing and nebulizer treatments Be sure and take your medication with food as it may reduce the nausea   Arrie Senate MD

## 2016-03-04 NOTE — Patient Instructions (Addendum)
Drink Clear fluids - water, sprite, 7 up Decrease caffeine Finish all prescribed medications and follow up on 03/10/16 with Dr Darlyn ReadStacks Continue with cough medicine as doing Take Tylenol as needed for aches pains and fever Continue with inhalers and with home breathing and nebulizer treatments Be sure and take your medication with food as it may reduce the nausea

## 2016-03-05 LAB — CBC WITH DIFFERENTIAL/PLATELET
BASOS ABS: 0 10*3/uL (ref 0.0–0.2)
Basos: 0 %
EOS (ABSOLUTE): 0 10*3/uL (ref 0.0–0.4)
Eos: 0 %
HEMOGLOBIN: 12.7 g/dL (ref 11.1–15.9)
Hematocrit: 39.3 % (ref 34.0–46.6)
Immature Grans (Abs): 0.3 10*3/uL — ABNORMAL HIGH (ref 0.0–0.1)
Immature Granulocytes: 2 %
LYMPHS ABS: 1 10*3/uL (ref 0.7–3.1)
LYMPHS: 7 %
MCH: 29.1 pg (ref 26.6–33.0)
MCHC: 32.3 g/dL (ref 31.5–35.7)
MCV: 90 fL (ref 79–97)
MONOCYTES: 6 %
Monocytes Absolute: 0.9 10*3/uL (ref 0.1–0.9)
Neutrophils Absolute: 12.2 10*3/uL — ABNORMAL HIGH (ref 1.4–7.0)
Neutrophils: 85 %
Platelets: 737 10*3/uL — ABNORMAL HIGH (ref 150–379)
RBC: 4.36 x10E6/uL (ref 3.77–5.28)
RDW: 13.9 % (ref 12.3–15.4)
WBC: 14.4 10*3/uL — AB (ref 3.4–10.8)

## 2016-03-05 LAB — URINE CULTURE

## 2016-03-05 LAB — BMP8+EGFR
BUN / CREAT RATIO: 14 (ref 12–28)
BUN: 8 mg/dL (ref 8–27)
CHLORIDE: 94 mmol/L — AB (ref 96–106)
CO2: 31 mmol/L — AB (ref 18–29)
CREATININE: 0.57 mg/dL (ref 0.57–1.00)
Calcium: 9.4 mg/dL (ref 8.7–10.3)
GFR calc Af Amer: 108 mL/min/{1.73_m2} (ref >59)
GFR calc non Af Amer: 94 mL/min/{1.73_m2} (ref >59)
GLUCOSE: 106 mg/dL — AB (ref 65–99)
POTASSIUM: 4.2 mmol/L (ref 3.5–5.2)
Sodium: 140 mmol/L (ref 134–144)

## 2016-03-10 ENCOUNTER — Ambulatory Visit (INDEPENDENT_AMBULATORY_CARE_PROVIDER_SITE_OTHER): Payer: Commercial Managed Care - HMO | Admitting: Family Medicine

## 2016-03-10 ENCOUNTER — Encounter: Payer: Self-pay | Admitting: Family Medicine

## 2016-03-10 ENCOUNTER — Ambulatory Visit (INDEPENDENT_AMBULATORY_CARE_PROVIDER_SITE_OTHER): Payer: Commercial Managed Care - HMO

## 2016-03-10 VITALS — BP 133/75 | HR 130 | Temp 97.1°F | Ht 63.0 in | Wt 126.4 lb

## 2016-03-10 DIAGNOSIS — J189 Pneumonia, unspecified organism: Secondary | ICD-10-CM

## 2016-03-10 DIAGNOSIS — J441 Chronic obstructive pulmonary disease with (acute) exacerbation: Secondary | ICD-10-CM | POA: Diagnosis not present

## 2016-03-10 MED ORDER — AMOXICILLIN-POT CLAVULANATE 875-125 MG PO TABS: 1.0000 | ORAL_TABLET | Freq: Two times a day (BID) | ORAL | Status: DC

## 2016-03-10 NOTE — Progress Notes (Signed)
Subjective:  Patient ID: Catherine Harrell, female    DOB: 08-Aug-1946  Age: 70 y.o. MRN: [id number]  CC: Hospitalization Follow-up   HPI Catherine Harrell presents for follow upOf recent hospitalization for pneumonia. She also had a CT scan showing extensive patchy infiltrates bilaterally with consolidation atelectasis of the entire right middle lobe and a portion of the lingula. Extensive bronchiectasis mediastinal adenopathy emphysema and findings suggestive of atypical infection such as mycobacterial infection. Comparison to CT of 2 years ago, noted below shows there was evidence for MAI She is using oxygen and would like to discontinue that. She has seen pulmonology in the past but has been 3 years. Of note is that she had a pulse climb to 130 today and walked in the hall on room air. Her sat was 94.    History Catherine Harrell has a past medical history of COPD (chronic obstructive pulmonary disease) ([place]).   She has no past surgical history on file.   Her family history includes Cancer in her daughter; Heart disease in her brother, father, and sister.She reports that she quit smoking about 11 years ago. Her smoking use included Cigarettes. She has a 30 pack-year smoking history. She has never used smokeless tobacco. She reports that she does not drink alcohol or use illicit drugs.    ROS Review of Systems  Constitutional: Negative for fever, activity change and appetite change.  HENT: Positive for ear pain and hearing loss. Negative for congestion, rhinorrhea and sore throat.   Eyes: Negative for visual disturbance.  Respiratory: Positive for cough and shortness of breath.   Cardiovascular: Negative for chest pain and palpitations.  Gastrointestinal: Negative for nausea, abdominal pain and diarrhea.  Genitourinary: Negative for dysuria.  Musculoskeletal: Negative for myalgias and arthralgias.    Objective:  BP 133/75 mmHg  Pulse 130  Temp(Src) 97.1 F (36.2 C) (Oral)  Ht 5' 3"  (1.6  m)  Wt 126 lb 6.4 oz (57.335 kg)  BMI 22.40 kg/m2  SpO2 94%  BP Readings from Last 3 Encounters:  03/10/16 133/75  03/04/16 146/73  01/28/16 159/86    Wt Readings from Last 3 Encounters:  03/10/16 126 lb 6.4 oz (57.335 kg)  03/04/16 130 lb (58.968 kg)  01/28/16 132 lb 9.6 oz (60.147 kg)     Physical Exam  Constitutional: She is oriented to person, place, and time. She appears well-developed and well-nourished. No distress.  HENT:  Head: Normocephalic and atraumatic.  Right Ear: External ear normal.  Left Ear: External ear normal.  Nose: Nose normal.  Mouth/Throat: Oropharynx is clear and moist.  Eyes: Conjunctivae and EOM are normal. Pupils are equal, round, and reactive to light.  Neck: Normal range of motion. Neck supple. No thyromegaly present.  Cardiovascular: Normal rate, regular rhythm and normal heart sounds.   No murmur heard. Pulmonary/Chest: No respiratory distress. She has wheezes. She exhibits no tenderness.  Sounds are distant with decreased expiratory phase  Abdominal: Soft. Bowel sounds are normal. She exhibits no distension. There is no tenderness.  Lymphadenopathy:    She has no cervical adenopathy.  Neurological: She is alert and oriented to person, place, and time. She has normal reflexes.  Skin: Skin is warm and dry.  Psychiatric: She has a normal mood and affect. Her behavior is normal. Judgment and thought content normal.     Lab Results  Component Value Date   WBC 14.4* 03/04/2016   HCT 39.3 03/04/2016   PLT 737* 03/04/2016   GLUCOSE 106* 03/04/2016   ALT  8 12/19/2015   AST 13 12/19/2015   NA 140 03/04/2016   K 4.2 03/04/2016   CL 94* 03/04/2016   CREATININE 0.57 03/04/2016   BUN 8 03/04/2016   CO2 31* 03/04/2016    Ct Chest Wo Contrast  01/26/2014  CLINICAL DATA:  Followup evaluation for pneumonia. EXAM: CT CHEST WITHOUT CONTRAST TECHNIQUE: Multidetector CT imaging of the chest was performed following the standard protocol without IV  contrast. COMPARISON:  Chest x-ray 01/16/2014. FINDINGS: Mediastinum: Heart size is normal. There is no significant pericardial fluid, thickening or pericardial calcification. There is atherosclerosis of the thoracic aorta, the great vessels of the mediastinum and the coronary arteries, including calcified atherosclerotic plaque in the left main, left anterior descending and right coronary arteries. Numerous borderline enlarged and mildly enlarged mediastinal and hilar lymph nodes, largest of which measures 1.3 cm in short axis in the lower right paratracheal station. Esophagus is unremarkable in appearance. Lungs/Pleura: Diffuse bronchial wall thickening. Scattered areas of mild cylindrical bronchiectasis. Extensive peribronchovascular micro and macronodularity noted throughout all aspects of the lungs bilaterally, with a slight upper lung predominance, as well as extensive scarring, architectural distortion and chronic volume loss in both the inferior segment of the lingula and in the right middle lobe (the right middle lobe there is essentially complete chronic atelectasis). No pleural effusions. Mild centrilobular emphysema. Upper Abdomen: Numerous calcified granulomas throughout the spleen. Calcified granuloma in the left lobe of the liver. Musculoskeletal: There are no aggressive appearing lytic or blastic lesions noted in the visualized portions of the skeleton. IMPRESSION: 1. The appearance of the lungs is most compatible with a chronic indolent atypical infectious process such as MAI (mycobacterium avium intracellulare), as detailed above. Mediastinal and hilar lymph nodes are presumably reactive. 2. Atherosclerosis, including left main and 2 vessel coronary artery disease. Assessment for potential risk factor modification, dietary therapy or pharmacologic therapy may be warranted, if clinically indicated. 3. Mild centrilobular emphysema. 4. Sequela of old granulomatous disease in the liver and spleen.  Electronically Signed   By: Vinnie Langton M.D.   On: 01/26/2014 16:00    Assessment & Plan:   Catherine Harrell was seen today for hospitalization follow-up.  Diagnoses and all orders for this visit:  CAP (community acquired pneumonia) -     DG Chest 2 View; Future -     CBC with Differential/Platelet -     CMP14+EGFR -     Urinalysis, Complete  Acute exacerbation of chronic obstructive pulmonary disease (COPD) ([place]) -     DG Chest 2 View; Future -     CBC with Differential/Platelet -     CMP14+EGFR -     Urinalysis, Complete  Other orders -     amoxicillin-clavulanate (AUGMENTIN) 875-125 MG tablet; Take 1 tablet by mouth 2 (two) times daily. Take all of this medication    X-ray today shows multiple abnormalities, that report is available and reviewed.  I have discontinued Ms. Mikita's amoxicillin, azithromycin, benzonatate, and methylPREDNISolone. I am also having her start on amoxicillin-clavulanate. Additionally, I am having her maintain her losartan, ipratropium, fluticasone furoate-vilanterol, and predniSONE.  Meds ordered this encounter  Medications  . predniSONE (DELTASONE) 5 MG tablet    Sig: Reported on 03/10/2016    Refill:  2  . amoxicillin-clavulanate (AUGMENTIN) 875-125 MG tablet    Sig: Take 1 tablet by mouth 2 (two) times daily. Take all of this medication    Dispense:  20 tablet    Refill:  0  Follow-up: Return in about 1 week (around 03/17/2016).  Claretta Fraise, M.D.

## 2016-03-24 ENCOUNTER — Encounter: Payer: Self-pay | Admitting: Family Medicine

## 2016-03-30 DIAGNOSIS — J441 Chronic obstructive pulmonary disease with (acute) exacerbation: Secondary | ICD-10-CM | POA: Diagnosis not present

## 2016-04-30 ENCOUNTER — Ambulatory Visit (INDEPENDENT_AMBULATORY_CARE_PROVIDER_SITE_OTHER): Payer: Commercial Managed Care - HMO

## 2016-04-30 ENCOUNTER — Ambulatory Visit (INDEPENDENT_AMBULATORY_CARE_PROVIDER_SITE_OTHER): Payer: Commercial Managed Care - HMO | Admitting: Family Medicine

## 2016-04-30 VITALS — BP 155/84 | HR 121 | Temp 97.2°F | Ht 63.0 in | Wt 131.8 lb

## 2016-04-30 DIAGNOSIS — M79671 Pain in right foot: Secondary | ICD-10-CM

## 2016-04-30 DIAGNOSIS — J411 Mucopurulent chronic bronchitis: Secondary | ICD-10-CM

## 2016-04-30 DIAGNOSIS — M545 Low back pain, unspecified: Secondary | ICD-10-CM

## 2016-04-30 DIAGNOSIS — J441 Chronic obstructive pulmonary disease with (acute) exacerbation: Secondary | ICD-10-CM | POA: Diagnosis not present

## 2016-04-30 NOTE — Progress Notes (Signed)
Subjective:  Patient ID: Catherine Harrell, female    DOB: [birth year]  Age: 70 y.o. MRN: [id number]  CC: CAP   HPI [name] presents for Recheck of pneumonia. She says that her symptoms have cleared completely. Her oxygen has been checked at home regularly she has home pulse ox. She says her readings are in the 92-93 range on room air. She says that actually go up higher when she is active. She says that she has been pulling some weeds and may have hurt her back somewhat. She's having some low back pain without radiation. That is moderate in intensity it does not require any medication at this time. However it is slowing her down and causing her to stoop. She also dropped something on her right foot at couple days ago and is having pain at the right second toe. She is having some swelling and discoloration as well. She would like to have that checked out. History Catherine Harrell has a past medical history of COPD (chronic obstructive pulmonary disease) ([facility]).   She has no past surgical history on file.   Her family history includes Cancer in her daughter; Heart disease in her brother, father, and sister.She reports that she quit smoking about 11 years ago. Her smoking use included Cigarettes. She has a 30 pack-year smoking history. She has never used smokeless tobacco. She reports that she does not drink alcohol or use illicit drugs.    ROS Review of Systems  Constitutional: Negative for fever, activity change and appetite change.  HENT: Negative for congestion, rhinorrhea and sore throat.   Eyes: Negative for visual disturbance.  Respiratory: Negative for cough and shortness of breath.   Cardiovascular: Negative for chest pain and palpitations.  Gastrointestinal: Negative for nausea, abdominal pain and diarrhea.  Genitourinary: Negative for dysuria.  Musculoskeletal: Negative for myalgias and arthralgias.    Objective:  BP 155/84 mmHg  Pulse 121  Temp(Src) 97.2 F (36.2 C) (Oral)   Ht  (1.6 m)  Wt 131 lb 12.8 oz (59.784 kg)  BMI 23.35 kg/m2  SpO2 93%  BP Readings from Last 3 Encounters:  04/30/16 155/84  03/10/16 133/75  03/04/16 146/73    Wt Readings from Last 3 Encounters:  04/30/16 131 lb 12.8 oz (59.784 kg)  03/10/16 126 lb 6.4 oz (57.335 kg)  03/04/16 130 lb (58.968 kg)     Physical Exam   Lab Results  Component Value Date   WBC 14.4* 03/04/2016   HCT 39.3 03/04/2016   PLT 737* 03/04/2016   GLUCOSE 106* 03/04/2016   ALT 8 12/19/2015   AST 13 12/19/2015   NA 140 03/04/2016   K 4.2 03/04/2016   CL 94* 03/04/2016   CREATININE 0.57 03/04/2016   BUN 8 03/04/2016   CO2 31* 03/04/2016    Ct Chest Wo Contrast  01/26/2014  CLINICAL DATA:  Followup evaluation for pneumonia. EXAM: CT CHEST WITHOUT CONTRAST TECHNIQUE: Multidetector CT imaging of the chest was performed following the standard protocol without IV contrast. COMPARISON:  Chest x-ray 01/16/2014. FINDINGS: Mediastinum: Heart size is normal. There is no significant pericardial fluid, thickening or pericardial calcification. There is atherosclerosis of the thoracic aorta, the great vessels of the mediastinum and the coronary arteries, including calcified atherosclerotic plaque in the left main, left anterior descending and right coronary arteries. Numerous borderline enlarged and mildly enlarged mediastinal and hilar lymph nodes, largest of which measures 1.3 cm in short axis in the lower right paratracheal station. Esophagus is unremarkable in appearance. Lungs/Pleura:  Diffuse bronchial wall thickening. Scattered areas of mild cylindrical bronchiectasis. Extensive peribronchovascular micro and macronodularity noted throughout all aspects of the lungs bilaterally, with a slight upper lung predominance, as well as extensive scarring, architectural distortion and chronic volume loss in both the inferior segment of the lingula and in the right middle lobe (the right middle lobe there is essentially  complete chronic atelectasis). No pleural effusions. Mild centrilobular emphysema. Upper Abdomen: Numerous calcified granulomas throughout the spleen. Calcified granuloma in the left lobe of the liver. Musculoskeletal: There are no aggressive appearing lytic or blastic lesions noted in the visualized portions of the skeleton. IMPRESSION: 1. The appearance of the lungs is most compatible with a chronic indolent atypical infectious process such as MAI (mycobacterium avium intracellulare), as detailed above. Mediastinal and hilar lymph nodes are presumably reactive. 2. Atherosclerosis, including left main and 2 vessel coronary artery disease. Assessment for potential risk factor modification, dietary therapy or pharmacologic therapy may be warranted, if clinically indicated. 3. Mild centrilobular emphysema. 4. Sequela of old granulomatous disease in the liver and spleen. Electronically Signed   By: Trudie Reedaniel  Entrikin M.D.   On: 01/26/2014 16:00    Assessment & Plan:   Catherine CanesChristine was seen today for cap.  Diagnoses and all orders for this visit:  Mucopurulent chronic bronchitis ([facility]) -     DG Chest 2 View; Future  Midline low back pain without sciatica -     DG Lumbar Spine 2-3 Views; Future  Right foot pain -     DG Foot Complete Right; Future -     DME Other see comment -     Post-op shoe  X-ray shows some evidence for fracture of the middle phalanx of the right second toe. There is apparent compression at L5. The chest x-ray shows no acute infiltrate. Final readings are pending.   I have discontinued Ms. Pellicano's amoxicillin-clavulanate. I am also having her maintain her losartan, ipratropium, fluticasone furoate-vilanterol, and predniSONE.  No orders of the defined types were placed in this encounter.     Follow-up: No Follow-up on file.  Mechele ClaudeWarren Aerilyn Slee, M.D.

## 2016-05-01 NOTE — Progress Notes (Signed)
Quick Note:  Please contact the patient regarding:The area on the foot that I thought might represent a fracture was cleared by radiology. There is no fracture. WS ______

## 2016-05-01 NOTE — Progress Notes (Signed)
Quick Note:  Please contact the patient regarding: The spine has arthritis changes and a curvature called scoliosis. These are the likely causes of her back pain ______

## 2016-05-15 ENCOUNTER — Telehealth: Payer: Self-pay | Admitting: Family Medicine

## 2016-05-15 NOTE — Telephone Encounter (Signed)
addressed

## 2016-05-21 ENCOUNTER — Other Ambulatory Visit: Payer: Self-pay | Admitting: Family Medicine

## 2016-05-29 ENCOUNTER — Encounter: Payer: Self-pay | Admitting: *Deleted

## 2016-05-30 ENCOUNTER — Ambulatory Visit: Payer: Commercial Managed Care - HMO | Admitting: Family Medicine

## 2016-06-05 ENCOUNTER — Telehealth: Payer: Self-pay | Admitting: Family Medicine

## 2016-06-05 NOTE — Telephone Encounter (Signed)
Pt notified of CXR results Verbalizes understanding 

## 2016-06-16 ENCOUNTER — Telehealth: Payer: Self-pay | Admitting: Family Medicine

## 2016-06-19 ENCOUNTER — Encounter: Payer: Self-pay | Admitting: Family Medicine

## 2016-06-19 NOTE — Telephone Encounter (Signed)
Scheduled

## 2016-06-21 ENCOUNTER — Other Ambulatory Visit: Payer: Self-pay | Admitting: Family Medicine

## 2016-08-18 ENCOUNTER — Ambulatory Visit (INDEPENDENT_AMBULATORY_CARE_PROVIDER_SITE_OTHER): Payer: Commercial Managed Care - HMO

## 2016-08-18 ENCOUNTER — Encounter: Payer: Commercial Managed Care - HMO | Admitting: *Deleted

## 2016-08-18 DIAGNOSIS — Z1231 Encounter for screening mammogram for malignant neoplasm of breast: Secondary | ICD-10-CM | POA: Diagnosis not present

## 2016-08-18 DIAGNOSIS — Z23 Encounter for immunization: Secondary | ICD-10-CM | POA: Diagnosis not present

## 2016-09-18 ENCOUNTER — Other Ambulatory Visit: Payer: Self-pay | Admitting: Family Medicine

## 2016-10-28 ENCOUNTER — Encounter: Payer: Self-pay | Admitting: Family Medicine

## 2016-10-28 ENCOUNTER — Ambulatory Visit (INDEPENDENT_AMBULATORY_CARE_PROVIDER_SITE_OTHER): Payer: Commercial Managed Care - HMO | Admitting: Family Medicine

## 2016-10-28 VITALS — BP 147/80 | HR 90 | Temp 98.0°F | Resp 22 | Ht 63.0 in | Wt 134.0 lb

## 2016-10-28 DIAGNOSIS — I10 Essential (primary) hypertension: Secondary | ICD-10-CM | POA: Diagnosis not present

## 2016-10-28 DIAGNOSIS — J441 Chronic obstructive pulmonary disease with (acute) exacerbation: Secondary | ICD-10-CM

## 2016-10-28 DIAGNOSIS — J411 Mucopurulent chronic bronchitis: Secondary | ICD-10-CM | POA: Diagnosis not present

## 2016-10-28 MED ORDER — LOSARTAN POTASSIUM 25 MG PO TABS: 25.0000 mg | ORAL_TABLET | Freq: Every day | ORAL | 1 refills | Status: DC

## 2016-10-28 MED ORDER — IPRATROPIUM BROMIDE 0.02 % IN SOLN: 2.5000 mL | Freq: Two times a day (BID) | RESPIRATORY_TRACT | 5 refills | Status: DC

## 2016-10-28 MED ORDER — AZITHROMYCIN 250 MG PO TABS: ORAL_TABLET | ORAL | 0 refills | Status: DC

## 2016-10-28 NOTE — Progress Notes (Signed)
Subjective:  Patient ID: Catherine Harrell Harrell, female    DOB: 04/02/1946  Age: 70 y.o. MRN: [id number]  CC: Medication Refill and Chest congestion   HPI Catherine Harrell Arcilla presents for Patient presents with upper respiratory congestion. Rhinorrhea that is frequently purulent. There is moderate sore throat. Patient reports coughing frequently as well.-colored/purulent sputum noted. There is no fever no chills no sweats. The patient denies being short of breath. Onset was 3-5 days ago. Gradually worsening in spite of home remedies.  . follow-up of hypertension. Patient has no history of headache chest pain or shortness of breath or recent cough. Patient also denies symptoms of TIA such as numbness weakness lateralizing.  Patient denies side effects from his medication. States taking it regularly. However ran out a few days ago.    History Catherine Harrell has a past medical history of COPD (chronic obstructive pulmonary disease) ([facility]).   She has no past surgical history on file.   Her family history includes Cancer in her daughter; Heart disease in her brother, father, and sister.She reports that she quit smoking about 11 years ago. Her smoking use included Cigarettes. She has a 30.00 pack-year smoking history. She has never used smokeless tobacco. She reports that she does not drink alcohol or use drugs.    ROS Review of Systems  Constitutional: Negative for activity change, appetite change and fever.  HENT: Positive for congestion. Negative for rhinorrhea and sore throat.   Eyes: Negative for visual disturbance.  Respiratory: Positive for cough, shortness of breath and wheezing.   Cardiovascular: Negative for chest pain and palpitations.  Gastrointestinal: Negative for abdominal pain, diarrhea and nausea.  Genitourinary: Negative for dysuria.  Musculoskeletal: Negative for arthralgias and myalgias.    Objective:  BP (!) 149/83   Pulse 95   Temp 98 F (36.7 C) (Oral)   Resp (!) 22   Ht 5'  3" (1.6 m)   Wt 134 lb (60.8 kg)   SpO2 94%   BMI 23.74 kg/m   BP Readings from Last 3 Encounters:  10/28/16 (!) 149/83  04/30/16 (!) 155/84  03/10/16 133/75    Wt Readings from Last 3 Encounters:  10/28/16 134 lb (60.8 kg)  04/30/16 131 lb 12.8 oz (59.8 kg)  03/10/16 126 lb 6.4 oz (57.3 kg)     Physical Exam  Constitutional: She is oriented to person, place, and time. She appears well-developed and well-nourished. No distress.  HENT:  Head: Normocephalic and atraumatic.  Right Ear: External ear normal.  Left Ear: External ear normal.  Nose: Nose normal.  Mouth/Throat: Oropharynx is clear and moist.  Eyes: Conjunctivae and EOM are normal. Pupils are equal, round, and reactive to light.  Neck: Normal range of motion. Neck supple. No thyromegaly present.  Cardiovascular: Normal rate, regular rhythm and normal heart sounds.   No murmur heard. Pulmonary/Chest: Effort normal and breath sounds normal. No respiratory distress. She has no wheezes. She has no rales.  Abdominal: Soft. Bowel sounds are normal. She exhibits no distension. There is no tenderness.  Lymphadenopathy:    She has no cervical adenopathy.  Neurological: She is alert and oriented to person, place, and time. She has normal reflexes.  Skin: Skin is warm and dry.  Psychiatric: She has a normal mood and affect. Her behavior is normal. Judgment and thought content normal.     Lab Results  Component Value Date   WBC 14.4 (H) 03/04/2016   HCT 39.3 03/04/2016   PLT 737 (H) 03/04/2016   GLUCOSE 106 (  H) 03/04/2016   ALT 8 12/19/2015   AST 13 12/19/2015   NA 140 03/04/2016   K 4.2 03/04/2016   CL 94 (L) 03/04/2016   CREATININE 0.57 03/04/2016   BUN 8 03/04/2016   CO2 31 (H) 03/04/2016     Assessment & Plan:   Catherine Harrell was seen today for medication refill and chest congestion.  Diagnoses and all orders for this visit:  Mucopurulent chronic bronchitis ([facility])  Acute exacerbation of chronic obstructive  pulmonary disease (COPD) ([facility])  Benign essential HTN  Other orders -     azithromycin (ZITHROMAX Z-PAK) 250 MG tablet; Take two right away Then one a day for the next 4 days.     I have discontinued Ms. Munch's BREO ELLIPTA. I am also having her start on azithromycin. Additionally, I am having her maintain her losartan, ipratropium, predniSONE, and predniSONE.  Meds ordered this encounter  Medications  . azithromycin (ZITHROMAX Z-PAK) 250 MG tablet    Sig: Take two right away Then one a day for the next 4 days.    Dispense:  6 each    Refill:  0     Follow-up: No Follow-up on file.  Mechele ClaudeWarren Angus Amini, M.D.

## 2016-10-28 NOTE — Addendum Note (Signed)
Addended by: Caryl BisBOWMAN, Immanuel Fedak M on: 10/28/2016 02:50 PM   Modules accepted: Orders

## 2017-01-13 ENCOUNTER — Other Ambulatory Visit: Payer: Self-pay | Admitting: Family Medicine

## 2017-01-13 NOTE — Telephone Encounter (Signed)
Explained to patient she would need to be seen for insomnia and discuss medication options with Dr. Darlyn ReadStacks.  Patient agrees to appointment and will see Dr. Darlyn ReadStacks on 01/14/17 at 3:10 pm.

## 2017-01-13 NOTE — Telephone Encounter (Signed)
Patient has never been on this medication before, would need to be seen.  LMTCB jkp 2/20

## 2017-01-14 ENCOUNTER — Encounter: Payer: Self-pay | Admitting: Family Medicine

## 2017-01-14 ENCOUNTER — Ambulatory Visit (INDEPENDENT_AMBULATORY_CARE_PROVIDER_SITE_OTHER): Payer: Medicare HMO | Admitting: Family Medicine

## 2017-01-14 VITALS — BP 136/79 | HR 96 | Temp 97.4°F | Ht 63.0 in | Wt 131.0 lb

## 2017-01-14 DIAGNOSIS — G4701 Insomnia due to medical condition: Secondary | ICD-10-CM

## 2017-01-14 DIAGNOSIS — M26629 Arthralgia of temporomandibular joint, unspecified side: Secondary | ICD-10-CM

## 2017-01-14 MED ORDER — GABAPENTIN 300 MG PO CAPS: ORAL_CAPSULE | ORAL | 1 refills | Status: DC

## 2017-01-14 NOTE — Progress Notes (Signed)
Subjective:  Patient ID: Catherine Harrell, female    DOB: 1946/06/06  Age: 71 y.o. MRN: [id number]  CC: Insomnia (pt here today c/o tossing and turning all night and wants to discuss going back on Gabapentin)   HPI Catherine Harrell presents for Several weeks of increasing restlessness at night. She tosses and turns and relates her inability to sleep back to her TMJ syndrome. The pain just shoots up her left jaw line. She has al cough from her COPD. That seems to jolt her TMJ and increase the pain. Symptoms occur every night. She is getting quite exhausted and weak from lack of sleep. The sedative effect of the gabapentin helps with sleep. Also the pain relief affect helps her to sleep better as well. She last took the med almost a year ago. Did well at first, but sx have recurred over the last few weeks, gradually increasing.   History Catherine Harrell has a past medical history of COPD (chronic obstructive pulmonary disease) ([facility]).   She has no past surgical history on file.   Her family history includes Cancer in her daughter; Heart disease in her brother, father, and sister.She reports that she quit smoking about 12 years ago. Her smoking use included Cigarettes. She has a 30.00 pack-year smoking history. She has never used smokeless tobacco. She reports that she does not drink alcohol or use drugs.    ROS Review of Systems  Constitutional: Negative for activity change, appetite change and fever.  HENT: Negative for congestion, rhinorrhea and sore throat.   Eyes: Negative for visual disturbance.  Respiratory: Negative for cough and shortness of breath.   Cardiovascular: Negative for chest pain and palpitations.  Gastrointestinal: Negative for abdominal pain, diarrhea and nausea.  Genitourinary: Negative for dysuria.  Musculoskeletal: Negative for arthralgias and myalgias.    Objective:  BP 136/79   Pulse 96   Temp 97.4 F (36.3 C) (Oral)   Ht 5\' 3"  (1.6 m)   Wt 131 lb (59.4 kg)    BMI 23.21 kg/m   BP Readings from Last 3 Encounters:  01/14/17 136/79  10/28/16 (!) 147/80  04/30/16 (!) 155/84    Wt Readings from Last 3 Encounters:  01/14/17 131 lb (59.4 kg)  10/28/16 134 lb (60.8 kg)  04/30/16 131 lb 12.8 oz (59.8 kg)     Physical Exam  Constitutional: She is oriented to person, place, and time. She appears well-developed and well-nourished. No distress.  HENT:  Head: Normocephalic and atraumatic.  Eyes: Conjunctivae are normal. Pupils are equal, round, and reactive to light.  Neck: Normal range of motion. Neck supple. No thyromegaly present.  Cardiovascular: Normal rate, regular rhythm and normal heart sounds.   No murmur heard. Pulmonary/Chest: Effort normal and breath sounds normal. No respiratory distress. She has no wheezes. She has no rales.  Abdominal: Soft. Bowel sounds are normal. She exhibits no distension. There is no tenderness.  Musculoskeletal: Normal range of motion.  Lymphadenopathy:    She has no cervical adenopathy.  Neurological: She is alert and oriented to person, place, and time.  Skin: Skin is warm and dry.  Psychiatric: She has a normal mood and affect. Her behavior is normal. Judgment and thought content normal.      Assessment & Plan:   Catherine Harrell was seen today for insomnia.  Diagnoses and all orders for this visit:  Insomnia due to medical condition  TMJ syndrome  Other orders -     gabapentin (NEURONTIN) 300 MG capsule; 1 qhs for 1  wk. Then 2 qhs for 1 wk. Then 3 for 1 week. Then 4 daily.   I have discontinued Ms. Mentor's azithromycin. I am also having her start on gabapentin. Additionally, I am having her maintain her predniSONE, losartan, and ipratropium.  Allergies as of 01/14/2017   No Known Allergies     Medication List       Accurate as of 01/14/17  3:45 PM. Always use your most recent med list.          gabapentin 300 MG capsule Commonly known as:  NEURONTIN 1 qhs for 1 wk. Then 2 qhs for 1 wk.  Then 3 for 1 week. Then 4 daily.   ipratropium 0.02 % nebulizer solution Commonly known as:  ATROVENT Take 2.5 mLs (0.5 mg total) by nebulization 2 (two) times daily.   losartan 25 MG tablet Commonly known as:  COZAAR Take 1 tablet (25 mg total) by mouth daily.   predniSONE 5 MG tablet Commonly known as:  DELTASONE TAKE 2 DAILY FOR TWO WEEKS THEN RESUME 1 DAILY        Follow-up: Return in about 1 month (around 02/11/2017), or if symptoms worsen or fail to improve.  Mechele ClaudeWarren Kaisley Stiverson, M.D.

## 2017-02-25 ENCOUNTER — Other Ambulatory Visit: Payer: Self-pay | Admitting: Family Medicine

## 2017-03-11 ENCOUNTER — Other Ambulatory Visit: Payer: Self-pay | Admitting: Family Medicine

## 2017-04-12 ENCOUNTER — Other Ambulatory Visit: Payer: Self-pay | Admitting: Family Medicine

## 2017-04-24 ENCOUNTER — Other Ambulatory Visit: Payer: Self-pay | Admitting: Family Medicine

## 2017-04-29 ENCOUNTER — Telehealth: Payer: Self-pay | Admitting: Family Medicine

## 2017-05-13 ENCOUNTER — Ambulatory Visit (INDEPENDENT_AMBULATORY_CARE_PROVIDER_SITE_OTHER): Payer: Medicare HMO | Admitting: *Deleted

## 2017-05-13 VITALS — BP 135/82 | HR 85 | Ht 62.75 in | Wt 133.0 lb

## 2017-05-13 DIAGNOSIS — Z0000 Encounter for general adult medical examination without abnormal findings: Secondary | ICD-10-CM

## 2017-05-13 DIAGNOSIS — Z1211 Encounter for screening for malignant neoplasm of colon: Secondary | ICD-10-CM

## 2017-05-13 DIAGNOSIS — Z0100 Encounter for examination of eyes and vision without abnormal findings: Secondary | ICD-10-CM

## 2017-05-13 DIAGNOSIS — Z7952 Long term (current) use of systemic steroids: Secondary | ICD-10-CM

## 2017-05-13 DIAGNOSIS — Z780 Asymptomatic menopausal state: Secondary | ICD-10-CM

## 2017-05-13 MED ORDER — PREDNISONE 5 MG PO TABS: 5.0000 mg | ORAL_TABLET | Freq: Every day | ORAL | 0 refills | Status: DC

## 2017-05-13 NOTE — Patient Instructions (Addendum)
  Ms. Catherine Harrell , Thank you for taking time to come for your Medicare Wellness Visit. I appreciate your ongoing commitment to your health goals. Please review the following plan we discussed and let me know if I can assist you in the future.   Keep appointment with Dr Darlyn ReadStacks. Fill out Advance Directives and bring a signed/notarized copy to our office. Return stool card to check for blood. Have a bone density scan at your next appointment.  Your mammogram is due 07/2017.  These are the goals we discussed: Goals    . Exercise 3x per week (30 min per time)          Perform chair exercises daily walk for 5 to 10 minute 3 times a week as tolerated.        This is a list of the screening recommended for you and due dates:  Health Maintenance  Topic Date Due  .  Hepatitis C: One time screening is recommended by Center for Disease Control  (CDC) for  adults born from [birth year] through 1966.   12-06-1945  . Colon Cancer Screening  12/01/1994  . DEXA scan (bone density measurement)  12/01/2009  . Flu Shot  06/24/2017  . Mammogram  08/18/2018  . Tetanus Vaccine  10/07/2020  . Pneumonia vaccines  Completed

## 2017-05-13 NOTE — Progress Notes (Signed)
Subjective:   Catherine Harrell is a 71 y.o. female who presents for an Initial Medicare Annual Wellness Visit. Catherine Harrell lives at home with her husband. They do not have any pets She is retired from the [employer]. She enjoys gardening and working in her flowerbeds.  Review of Systems    HEENT- Hearing loss  Cardiac Risk Factors include: advanced age (>5men, >16 women);hypertension;sedentary lifestyle   Respiratory: COPD has a significant neg impact. Uses Atrovent nebulizer BID and prednisone 5mg  daily. She questions if a rescue inhaler would be helpful.   Reports that health is about the same as last year.  Other systems negative     Objective:    Today's Vitals   05/13/17 1151  BP: 135/82  Pulse: 85  Weight: 133 lb (60.3 kg)  Height: 5' 2.75" (1.594 m)   Body mass index is 23.75 kg/m.   Current Medications (verified) Outpatient Encounter Prescriptions as of 05/13/2017  Medication Sig  . gabapentin (NEURONTIN) 300 MG capsule TAKE 1 CAP AT BEDTIME FOR 1 WK, 2 AT BEDTIME X 1 WK, 3 AT BEDTIME FOR 1 WK, THEN 4 CAPSULES DAILY (Patient taking differently: Taking 1 tablet in the morning and 1 tablet at bedtime)  . ipratropium (ATROVENT) 0.02 % nebulizer solution Take 2.5 mLs (0.5 mg total) by nebulization 2 (two) times daily.  Marland Kitchen losartan (COZAAR) 25 MG tablet TAKE 1 TABLET (25 MG TOTAL) BY MOUTH DAILY.  Marland Kitchen predniSONE (DELTASONE) 5 MG tablet Take 1 tablet (5 mg total) by mouth daily with breakfast.  . [DISCONTINUED] predniSONE (DELTASONE) 5 MG tablet TAKE 2 DAILY FOR TWO WEEKS THEN RESUME 1 DAILY   No facility-administered encounter medications on file as of 05/13/2017.     Allergies (verified) Patient has no known allergies.   History: Past Medical History:  Diagnosis Date  . COPD (chronic obstructive pulmonary disease) (HCC)   . Hypertension   . TMJ arthralgia    left   History reviewed. No pertinent surgical history. Family History  Problem Relation Age of  Onset  . Heart disease Father   . COPD Father   . Heart disease Sister   . Heart attack Sister   . Heart disease Brother   . Cancer Daughter        breast  . Hypertension Mother   . Diabetes Mother   . Healthy Son   . Kidney disease Daughter   . Healthy Son    Social History   Occupational History  . Not on file.   Social History Main Topics  . Smoking status: Former Smoker    Packs/day: 1.00    Years: 30.00    Types: Cigarettes    Quit date: 01/16/2005  . Smokeless tobacco: Never Used  . Alcohol use No  . Drug use: No  . Sexual activity: No    Tobacco Counseling No tobacco use  Activities of Daily Living In your present state of health, do you have any difficulty performing the following activities: 05/13/2017  Hearing? Y  Vision? Y  Difficulty concentrating or making decisions? N  Walking or climbing stairs? N  Dressing or bathing? N  Doing errands, shopping? N  Preparing Food and eating ? N  Using the Toilet? N  In the past six months, have you accidently leaked urine? N  Do you have problems with loss of bowel control? N  Managing your Medications? N  Managing your Finances? N  Housekeeping or managing your Housekeeping? N  Some recent data  might be hidden  has been evaluated for hearing loss but isn't interested in hearing aids at this time. 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 Wears reading glasses but needs eval for bifocals  Immunizations and Health Maintenance Immunization History  Administered Date(s) Administered  . Influenza Split 09/15/2013  . Influenza, High Dose Seasonal PF 08/18/2016  . Pneumococcal Conjugate-13 08/30/2014  . Pneumococcal-Unspecified 09/15/2013   Health Maintenance Due  Topic Date Due  . Hepatitis C Screening  May 05, 1946  . COLON CANCER SCREENING ANNUAL FOBT  12/01/1994  . DEXA SCAN  12/01/2009    Patient Care Team: [name], MD as PCP - General (Family  Medicine)  No hospitalizations, ER visits, or surgeries this past year    Assessment:   This is a routine wellness examination for Catherine Harrell.   Hearing/Vision screen Significant difficulty with hearing.  No obvious problems with vision. No current exam.  Dietary issues and exercise activities discussed: Current Exercise Habits: The patient does not participate in regular exercise at present, Exercise limited by: respiratory conditions(s)   Diet: Eats 3 meals a day   Goals    . Exercise 3x per week (30 min per time)          Perform chair exercises daily walk for 5 to 10 minute 3 times a week as tolerated.       Depression Screen PHQ 2/9 Scores 05/13/2017 01/14/2017 10/28/2016 04/30/2016 03/10/2016 03/04/2016 12/19/2015  PHQ - 2 Score 0 0 0 0 0 0 0    Fall Risk Fall Risk  05/13/2017 01/14/2017 10/28/2016 04/30/2016 03/10/2016  Falls in the past year? No No No No No  Number falls in past yr: - - - - -    Cognitive Function: MMSE - Mini Mental State Exam 05/13/2017  Orientation to time 5  Orientation to Place 5  Registration 3  Attention/ Calculation 3  Recall 2  Language- name 2 objects 2  Language- repeat 1  Language- follow 3 step command 3  Language- read & follow direction 1  Write a sentence 1  Copy design 1  Total score 27  normal exam impacted by hearing loss      Screening Tests Health Maintenance  Topic Date Due  . Hepatitis C Screening  10/31/1946  . COLON CANCER SCREENING ANNUAL FOBT  12/01/1994  . DEXA SCAN  12/01/2009  . INFLUENZA VACCINE  06/24/2017  . MAMMOGRAM  08/18/2018  . TETANUS/TDAP  10/07/2020  . PNA vac Low Risk Adult  Completed  colonoscopy declined    Plan:  Dexa due. Order placed. Patient wanted to wait until visit with PCP Appt with Dr Darlyn Read scheduled for 05/22/17 Bring a signed/notarized copy of advance directives to our office Referral for exam made Chair exercise handout given. Encouraged patient to do them daily.  Mammogram due in  Sept. Patient declined to schedule today. Return FOBT Discuss request for rescue inhaler with PCP at next visit   I have personally reviewed and noted the following in the patient's chart:   . Medical and social history . Use of alcohol, tobacco or illicit drugs  . Current medications and supplements . Functional ability and status . Nutritional status . Physical activity . Advanced directives . List of other physicians . Hospitalizations, surgeries, and ER visits in previous 12 months . Vitals . Screenings to include cognitive, depression, and falls . Referrals and appointments  In addition, I have reviewed and discussed with patient  certain preventive protocols, quality metrics, and best practice recommendations. A written personalized care plan for preventive services as well as general preventive health recommendations were provided to patient.     Demetrios LollKristen Rehan Holness, RN  05/13/2017    I have reviewed and agree with the above AWV documentation.  Mechele ClaudeWarren Stacks, M.D.

## 2017-05-15 ENCOUNTER — Other Ambulatory Visit: Payer: Self-pay | Admitting: Family Medicine

## 2017-05-18 NOTE — Addendum Note (Signed)
Addended by: Gwenith DailyHUDY, KRISTEN N on: 05/18/2017 03:29 PM   Modules accepted: Level of Service

## 2017-05-22 ENCOUNTER — Ambulatory Visit: Payer: Medicare HMO | Admitting: Family Medicine

## 2017-05-22 ENCOUNTER — Ambulatory Visit (INDEPENDENT_AMBULATORY_CARE_PROVIDER_SITE_OTHER): Payer: Medicare HMO | Admitting: Family Medicine

## 2017-05-22 ENCOUNTER — Ambulatory Visit (INDEPENDENT_AMBULATORY_CARE_PROVIDER_SITE_OTHER): Payer: Medicare HMO

## 2017-05-22 ENCOUNTER — Encounter: Payer: Self-pay | Admitting: Family Medicine

## 2017-05-22 VITALS — BP 131/70 | HR 84 | Temp 97.4°F | Ht 62.75 in | Wt 134.0 lb

## 2017-05-22 DIAGNOSIS — Z780 Asymptomatic menopausal state: Secondary | ICD-10-CM | POA: Diagnosis not present

## 2017-05-22 DIAGNOSIS — I10 Essential (primary) hypertension: Secondary | ICD-10-CM

## 2017-05-22 DIAGNOSIS — J411 Mucopurulent chronic bronchitis: Secondary | ICD-10-CM | POA: Diagnosis not present

## 2017-05-22 DIAGNOSIS — Z7952 Long term (current) use of systemic steroids: Secondary | ICD-10-CM

## 2017-05-22 MED ORDER — PREDNISONE 10 MG PO TABS: 10.0000 mg | ORAL_TABLET | Freq: Every day | ORAL | 2 refills | Status: DC

## 2017-05-22 MED ORDER — IPRATROPIUM-ALBUTEROL 0.5-2.5 (3) MG/3ML IN SOLN: 3.0000 mL | RESPIRATORY_TRACT | 5 refills | Status: DC | PRN

## 2017-05-22 NOTE — Progress Notes (Signed)
Subjective:  Patient ID: Catherine KirschnerChristine Harrell, female    DOB: 02/28/1946  Age: 71 y.o. MRN: [id number]  CC: Follow-up (pt here today for routine follow up of her chronic medical conditions and also needs dexa scan )   HPI Catherine KirschnerChristine Harrell presents for Getting short of breath a lot. Would like to have a rapid inhaler(rescue inhaler). Couldn't afford it a couple of years ago when she priced it. Has a lot of mucoid cough in the mornings. That gets better through the morning and she feels much less short of breath through the day but she always has some shortness of breath. This limits her activities significantly through the day.   follow-up of hypertension. Patient has no history of headache chest pain or shortness of breath or recent cough. Patient also denies symptoms of TIA such as numbness weakness lateralizing.  Patient denies side effects from his medication. States taking it regularly.   Depression screen [facility] 2/9 05/22/2017 05/13/2017 01/14/2017  Decreased Interest 0 0 0  Down, Depressed, Hopeless 0 0 0  PHQ - 2 Score 0 0 0    History Catherine CanesChristine has a past medical history of COPD (chronic obstructive pulmonary disease) (HCC); Hypertension; and TMJ arthralgia.   She has no past surgical history on file.   Her family history includes COPD in her father; Cancer in her daughter; Diabetes in her mother; Healthy in her son and son; Heart attack in her sister; Heart disease in her brother, father, and sister; Hypertension in her mother; Kidney disease in her daughter.She reports that she quit smoking about 12 years ago. Her smoking use included Cigarettes. She has a 30.00 pack-year smoking history. She has never used smokeless tobacco. She reports that she does not drink alcohol or use drugs.    ROS Review of Systems  Constitutional: Negative for activity change, appetite change and fever.  HENT: Negative for congestion, rhinorrhea and sore throat.   Eyes: Negative for visual disturbance.    Respiratory: Negative for cough and shortness of breath.   Cardiovascular: Negative for chest pain and palpitations.  Gastrointestinal: Negative for abdominal pain, diarrhea and nausea.  Genitourinary: Negative for dysuria.  Musculoskeletal: Negative for arthralgias and myalgias.    Objective:  BP 131/70   Pulse 84   Temp 97.4 F (36.3 C) (Oral)   Ht 5' 2.75" (1.594 m)   Wt 134 lb (60.8 kg)   BMI 23.93 kg/m   BP Readings from Last 3 Encounters:  05/22/17 131/70  05/13/17 135/82  01/14/17 136/79    Wt Readings from Last 3 Encounters:  05/22/17 134 lb (60.8 kg)  05/13/17 133 lb (60.3 kg)  01/14/17 131 lb (59.4 kg)     Physical Exam  Constitutional: She is oriented to person, place, and time. She appears well-developed and well-nourished. No distress.  HENT:  Head: Normocephalic and atraumatic.  Right Ear: External ear normal.  Left Ear: External ear normal.  Nose: Nose normal.  Mouth/Throat: Oropharynx is clear and moist.  Eyes: Conjunctivae and EOM are normal. Pupils are equal, round, and reactive to light.  Neck: Normal range of motion. Neck supple. No thyromegaly present.  Cardiovascular: Normal rate, regular rhythm and normal heart sounds.   No murmur heard. Pulmonary/Chest: Effort normal and breath sounds normal. No respiratory distress. She has no wheezes. She has no rales.  Abdominal: Soft. Bowel sounds are normal. She exhibits no distension. There is no tenderness.  Lymphadenopathy:    She has no cervical adenopathy.  Neurological: She is  alert and oriented to person, place, and time. She has normal reflexes.  Skin: Skin is warm and dry.  Psychiatric: She has a normal mood and affect. Her behavior is normal. Judgment and thought content normal.      Assessment & Plan:   Catherine Harrell was seen today for follow-up.  Diagnoses and all orders for this visit:  Mucopurulent chronic bronchitis (HCC) -     PR BREATHING CAPACITY TEST  Benign essential  HTN  Other orders -     ipratropium-albuterol (DUONEB) 0.5-2.5 (3) MG/3ML SOLN; Take 3 mLs by nebulization every 4 (four) hours as needed. -     predniSONE (DELTASONE) 10 MG tablet; Take 1 tablet (10 mg total) by mouth daily with breakfast.       I have changed Catherine Harrell's predniSONE. I am also having her start on ipratropium-albuterol. Additionally, I am having her maintain her ipratropium, losartan, and gabapentin.  Allergies as of 05/22/2017   No Known Allergies     Medication List       Accurate as of 05/22/17  2:00 PM. Always use your most recent med list.          gabapentin 300 MG capsule Commonly known as:  NEURONTIN TAKE 1 CAP AT BEDTIME FOR 1 WK, 2 AT BEDTIME X 1 WK, 3 AT BEDTIME FOR 1 WK, THEN 4 CAPSULES DAILY   ipratropium 0.02 % nebulizer solution Commonly known as:  ATROVENT Take 2.5 mLs (0.5 mg total) by nebulization 2 (two) times daily.   ipratropium-albuterol 0.5-2.5 (3) MG/3ML Soln Commonly known as:  DUONEB Take 3 mLs by nebulization every 4 (four) hours as needed.   losartan 25 MG tablet Commonly known as:  COZAAR TAKE 1 TABLET (25 MG TOTAL) BY MOUTH DAILY.   predniSONE 10 MG tablet Commonly known as:  DELTASONE Take 1 tablet (10 mg total) by mouth daily with breakfast.        Follow-up: Return in about 3 months (around 08/22/2017).  [name], M.D.

## 2017-05-25 ENCOUNTER — Other Ambulatory Visit: Payer: Self-pay | Admitting: Family Medicine

## 2017-05-25 MED ORDER — RISEDRONATE SODIUM 150 MG PO TABS: 150.0000 mg | ORAL_TABLET | ORAL | 3 refills | Status: DC

## 2017-05-25 NOTE — Telephone Encounter (Signed)
Patient aware of dexa results 

## 2017-06-04 ENCOUNTER — Other Ambulatory Visit: Payer: Self-pay | Admitting: Family Medicine

## 2017-06-09 ENCOUNTER — Encounter: Payer: Self-pay | Admitting: Pharmacist

## 2017-06-09 ENCOUNTER — Ambulatory Visit (INDEPENDENT_AMBULATORY_CARE_PROVIDER_SITE_OTHER): Payer: Medicare HMO | Admitting: Pharmacist

## 2017-06-09 DIAGNOSIS — M810 Age-related osteoporosis without current pathological fracture: Secondary | ICD-10-CM | POA: Diagnosis not present

## 2017-06-09 MED ORDER — RISEDRONATE SODIUM 150 MG PO TABS: ORAL_TABLET | ORAL | 11 refills | Status: DC

## 2017-06-09 NOTE — Progress Notes (Signed)
Patient ID: Catherine KirschnerChristine Harrell, female   DOB: 10/21/1946, 71 y.o.   MRN: [id number]     HPI: [name] is referred by her PCP Dr Darlyn ReadStacks to discuss DEXA results and treatment.  She is taking chronic prednisone 10mg  qd (recent dose increase) to treat COPD.  Back Pain?  Yes       Kyphosis?  No Prior fracture?  No Med(s) for Osteoporosis/Osteopenia:  Has been prescribed rsenodronate 150mg  weekly but has not started yet.  Med(s) previously tried for Osteoporosis/Osteopenia:  Took Miacalcin nasal spray for 18 months several years ago.                                                               PMH: Age at menopause:  [age] or 71 yo Hysterectomy?  No Oophorectomy?  No HRT? No Steroid Use?  Yes - Current.  Type/duration:  prednisone 10mg  qd Thyroid med?  No History of cancer?  No History of digestive disorders (ie Crohn's)?  No Current or previous eating disorders?  No Last Vitamin D Result:  Not checked with in last 3 years Last GFR Result:  94 (04/11/217)   FH/SH: Family history of osteoporosis?  No Parent with history of hip fracture?  No Family history of breast cancer?  Yes - daughter Exercise?  No - except just started some chair exercises Smoking?  No Alcohol?  Yes - 1 drink per day    Calcium Assessment Calcium Intake  # of servings/day  Calcium mg  Milk (8 oz) 1  x  300  = 300mg   Yogurt (4 oz) 0.5 x  200 = 100mg   Cheese (1 oz) 1 x  200 = 200mg   Other Calcium sources   250mg   Ca supplement 0 = 0   Estimated calcium intake per day 850mg     DEXA Results Date of Test T-Score for AP Spine L1-L2 T-Score for Total Left Hip  05/22/2017 -3.3 -3.4                Assessment: Osteoporosis   Recommendations: 1.  Start  risedronate (ACTONEL) 150mg  monthly.  Discussed administration and side effects to monitor for.  2.  recommend calcium 1200mg  daily through supplementation or diet.  3.  recommend weight bearing exercise as able 4.  Counseled and educated about fall risk and  prevention.  Recheck DEXA:  2 years  Time spent counseling patient:  30 minutes

## 2017-06-10 ENCOUNTER — Telehealth: Payer: Self-pay | Admitting: Family Medicine

## 2017-06-11 MED ORDER — IBANDRONATE SODIUM 150 MG PO TABS: 150.0000 mg | ORAL_TABLET | ORAL | 3 refills | Status: DC

## 2017-06-11 NOTE — Telephone Encounter (Signed)
Researched patient's formulary.  It appears that cost of ibandronate would be $5 as apposed to risendronate is $97.  Left message for patient that I would be switching to cheaper alternative.

## 2017-07-23 ENCOUNTER — Other Ambulatory Visit: Payer: Self-pay | Admitting: Family Medicine

## 2017-08-23 ENCOUNTER — Other Ambulatory Visit: Payer: Self-pay | Admitting: Family Medicine

## 2017-08-24 NOTE — Telephone Encounter (Signed)
Last seen 05/22/17  Dr Darlyn Read

## 2017-08-27 ENCOUNTER — Ambulatory Visit (INDEPENDENT_AMBULATORY_CARE_PROVIDER_SITE_OTHER): Payer: Medicare HMO | Admitting: Family Medicine

## 2017-08-27 ENCOUNTER — Ambulatory Visit (INDEPENDENT_AMBULATORY_CARE_PROVIDER_SITE_OTHER): Payer: Medicare HMO

## 2017-08-27 ENCOUNTER — Encounter (INDEPENDENT_AMBULATORY_CARE_PROVIDER_SITE_OTHER): Payer: Self-pay

## 2017-08-27 ENCOUNTER — Encounter: Payer: Self-pay | Admitting: Family Medicine

## 2017-08-27 VITALS — BP 152/79 | HR 108 | Temp 97.0°F | Ht 62.75 in | Wt 136.0 lb

## 2017-08-27 DIAGNOSIS — J441 Chronic obstructive pulmonary disease with (acute) exacerbation: Secondary | ICD-10-CM

## 2017-08-27 MED ORDER — AMOXICILLIN-POT CLAVULANATE 875-125 MG PO TABS: 1.0000 | ORAL_TABLET | Freq: Two times a day (BID) | ORAL | 0 refills | Status: DC

## 2017-08-27 MED ORDER — BETAMETHASONE SOD PHOS & ACET 6 (3-3) MG/ML IJ SUSP
6.0000 mg | Freq: Once | INTRAMUSCULAR | Status: AC
  Administered 2017-08-27: 6 mg via INTRAMUSCULAR

## 2017-08-27 MED ORDER — AZITHROMYCIN 250 MG PO TABS: ORAL_TABLET | ORAL | 0 refills | Status: DC

## 2017-08-27 MED ORDER — CEFTRIAXONE SODIUM 1 G IJ SOLR
1.0000 g | Freq: Once | INTRAMUSCULAR | Status: AC
  Administered 2017-08-27: 1 g via INTRAMUSCULAR

## 2017-08-27 NOTE — Addendum Note (Signed)
Addended by: Margurite Auerbach on: 08/27/2017 01:51 PM   Modules accepted: Orders

## 2017-08-27 NOTE — Progress Notes (Signed)
Chief Complaint  Patient presents with  . Cough    pt here today c/o cough and having trouble with her breathing    HPI  Patient presents today for 3 days of increasing wheezing and cough. It started 3 days ago with some upper respiratory congestion through her nasal passages. She's had some drainage and over the last couple nights she is awakening from sleep feeling short of breath.  PMH: Smoking status noted. Stopped 12 years ago after 30 years of smoking ROS: Review of Systems  Constitutional: Positive for activity change and appetite change. Negative for fever.  HENT: Positive for congestion, rhinorrhea and sore throat.   Eyes: Negative for visual disturbance.  Respiratory: Positive for cough and shortness of breath.   Cardiovascular: Negative for chest pain and palpitations.  Gastrointestinal: Negative for abdominal pain, diarrhea and nausea.  Musculoskeletal: Positive for myalgias.    Objective: BP (!) 152/79   Pulse (!) 108   Temp (!) 97 F (36.1 C) (Oral)   Ht 5' 2.75" (1.594 m)   Wt 136 lb (61.7 kg)   SpO2 96%   BMI 24.28 kg/m  Gen: NAD, alert, cooperative with exam HEENT: NCAT, EOMI, PERRL CV: RRR, good S1/S2, no murmur Resp: Coarse rhonchi and wheezes all fields no overt rales with fair exchange Ext: No edema, warm Neuro: Alert and oriented, No gross deficits Left lower lobe anterior segment infiltrate suspected on x-ray Assessment and plan:  1. Acute exacerbation of chronic obstructive pulmonary disease (COPD) ([place])     Meds ordered this encounter  Medications  . amoxicillin-clavulanate (AUGMENTIN) 875-125 MG tablet    Sig: Take 1 tablet by mouth 2 (two) times daily. Take all of this medication    Dispense:  20 tablet    Refill:  0  . azithromycin (ZITHROMAX Z-PAK) 250 MG tablet    Sig: Take two right away Then one a day for the next 4 days.    Dispense:  6 each    Refill:  0  . betamethasone acetate-betamethasone sodium phosphate (CELESTONE) injection  6 mg  . cefTRIAXone (ROCEPHIN) injection 1 g    Orders Placed This Encounter  Procedures  . DG Chest 2 View    Standing Status:   Future    Number of Occurrences:   1    Standing Expiration Date:   10/27/2018    Order Specific Question:   Reason for Exam (SYMPTOM  OR DIAGNOSIS REQUIRED)    Answer:   Wheezing all fields    Order Specific Question:   Preferred imaging location?    Answer:   Internal  . CBC with Differential/Platelet  . CMP14+EGFR    Order Specific Question:   Has the patient fasted?    Answer:   Yes    Follow up 4 days and as needed for increasing shortness of breath. Claretta Fraise, MD

## 2017-08-31 ENCOUNTER — Ambulatory Visit (INDEPENDENT_AMBULATORY_CARE_PROVIDER_SITE_OTHER): Payer: Medicare HMO | Admitting: Family Medicine

## 2017-08-31 ENCOUNTER — Encounter: Payer: Self-pay | Admitting: Family Medicine

## 2017-08-31 VITALS — BP 134/81 | HR 91 | Temp 97.0°F | Ht 62.75 in | Wt 136.0 lb

## 2017-08-31 DIAGNOSIS — J441 Chronic obstructive pulmonary disease with (acute) exacerbation: Secondary | ICD-10-CM

## 2017-08-31 NOTE — Progress Notes (Signed)
Subjective:  Patient ID: Catherine Harrell, female    DOB: [birth year]  Age: 71 y.o. MRN: [id number]  CC: Follow-up (pt here today for 4 day recheck which she says she is getting better)   HPI Catherine Harrell presents for Still has some cough and wheezing but is getting a lot better. She has some nausea with the antibiotics but that is mild and there is no diarrhea. She has started taking them with food and that has helped as well. The shortness of breath has lessened. She's had no fever chills or sweats.  Depression screen [facility] 2/9 08/31/2017 08/27/2017 05/22/2017  Decreased Interest 0 0 0  Down, Depressed, Hopeless 0 0 0  PHQ - 2 Score 0 0 0    History Catherine Harrell has a past medical history of COPD (chronic obstructive pulmonary disease) (HCC); Hypertension; Osteoporosis; and TMJ arthralgia.   She has no past surgical history on file.   Her family history includes COPD in her father and sister; Cancer in her daughter; Diabetes in her mother and sister; Healthy in her son and son; Heart attack in her sister; Heart disease in her brother, father, and sister; Hypertension in her mother; Kidney disease in her daughter.She reports that she quit smoking about 12 years ago. Her smoking use included Cigarettes. She has a 30.00 pack-year smoking history. She has never used smokeless tobacco. She reports that she does not drink alcohol or use drugs.    ROS Review of Systems  Constitutional: Negative for activity change, appetite change, chills and fever.  HENT: Negative for congestion, ear discharge, ear pain, hearing loss, nosebleeds, postnasal drip, rhinorrhea, sinus pressure, sneezing and trouble swallowing.   Respiratory: Positive for cough, shortness of breath and wheezing. Negative for chest tightness.   Cardiovascular: Negative for chest pain and palpitations.  Skin: Negative for rash.    Objective:  BP 134/81   Pulse 91   Temp (!) 97 F (36.1 C) (Oral)   Ht 5' 2.75" (1.594 m)   Wt  136 lb (61.7 kg)   SpO2 95%   BMI 24.28 kg/m   BP Readings from Last 3 Encounters:  08/31/17 134/81  08/27/17 (!) 152/79  05/22/17 131/70    Wt Readings from Last 3 Encounters:  08/31/17 136 lb (61.7 kg)  08/27/17 136 lb (61.7 kg)  05/22/17 134 lb (60.8 kg)     Physical Exam  Constitutional: She appears well-developed and well-nourished. No distress.  HENT:  Head: Normocephalic and atraumatic.  Right Ear: Tympanic membrane normal. No decreased hearing is noted.  Left Ear: Tympanic membrane normal. No decreased hearing is noted.  Nose: Mucosal edema present. Right sinus exhibits no frontal sinus tenderness. Left sinus exhibits no frontal sinus tenderness.  Mouth/Throat: No posterior oropharyngeal erythema.  Eyes: Conjunctivae and EOM are normal.  Neck: Normal range of motion. Neck supple. No Brudzinski's sign noted.  Cardiovascular: Normal rate, regular rhythm and normal heart sounds.   No murmur heard. Pulmonary/Chest: No respiratory distress. She has wheezes (There are a few scattered rhonchi. Exchange has improved significantly).  Lymphadenopathy:       Head (right side): No preauricular adenopathy present.       Head (left side): No preauricular adenopathy present.       Right cervical: No superficial cervical adenopathy present.      Left cervical: No superficial cervical adenopathy present.  Skin: Skin is warm and dry. No rash noted.  Psychiatric: She has a normal mood and affect. Her behavior is normal.  Assessment & Plan:   Catherine Harrell was seen today for follow-up.  Diagnoses and all orders for this visit:  Acute exacerbation of chronic obstructive pulmonary disease (COPD) (HCC)   Much improved. Continue antibiotics as is. Follow up for increasing shortness of breath or other symptoms and particularly if diarrhea occurs.    I am having Catherine Harrell maintain her gabapentin, ipratropium-albuterol, ibandronate, losartan, predniSONE, amoxicillin-clavulanate,  and azithromycin.  Allergies as of 08/31/2017   No Known Allergies     Medication List       Accurate as of 08/31/17  4:08 PM. Always use your most recent med list.          amoxicillin-clavulanate 875-125 MG tablet Commonly known as:  AUGMENTIN Take 1 tablet by mouth 2 (two) times daily. Take all of this medication   azithromycin 250 MG tablet Commonly known as:  ZITHROMAX Z-PAK Take two right away Then one a day for the next 4 days.   gabapentin 300 MG capsule Commonly known as:  NEURONTIN TAKE 1 CAP AT BEDTIME FOR 1 WK, 2 AT BEDTIME X 1 WK, 3 AT BEDTIME FOR 1 WK, THEN 4 CAPSULES DAILY   ibandronate 150 MG tablet Commonly known as:  BONIVA Take 1 tablet (150 mg total) by mouth every 30 (thirty) days. Take w/ a full glass of water, on an empty stomach.  Do not take anything else po or lie down for the next 30 min.   ipratropium-albuterol 0.5-2.5 (3) MG/3ML Soln Commonly known as:  DUONEB Take 3 mLs by nebulization every 4 (four) hours as needed.   losartan 25 MG tablet Commonly known as:  COZAAR TAKE 1 TABLET (25 MG TOTAL) BY MOUTH DAILY.   predniSONE 10 MG tablet Commonly known as:  DELTASONE TAKE 1 TABLET (10 MG TOTAL) BY MOUTH DAILY WITH BREAKFAST.        Follow-up: Return if symptoms worsen or fail to improve.  [name], M.D.

## 2017-09-16 ENCOUNTER — Ambulatory Visit (INDEPENDENT_AMBULATORY_CARE_PROVIDER_SITE_OTHER): Payer: Medicare HMO

## 2017-09-16 DIAGNOSIS — Z23 Encounter for immunization: Secondary | ICD-10-CM

## 2017-09-22 ENCOUNTER — Other Ambulatory Visit: Payer: Self-pay | Admitting: Family Medicine

## 2017-09-22 NOTE — Telephone Encounter (Signed)
Last seen 08/31/17  Dr Darlyn ReadStacks

## 2017-10-16 ENCOUNTER — Other Ambulatory Visit: Payer: Self-pay | Admitting: Family Medicine

## 2017-12-01 ENCOUNTER — Ambulatory Visit (INDEPENDENT_AMBULATORY_CARE_PROVIDER_SITE_OTHER): Payer: Medicare HMO | Admitting: Family Medicine

## 2017-12-01 ENCOUNTER — Ambulatory Visit (INDEPENDENT_AMBULATORY_CARE_PROVIDER_SITE_OTHER): Payer: Medicare HMO

## 2017-12-01 VITALS — BP 161/91 | HR 110 | Temp 97.8°F | Resp 34

## 2017-12-01 DIAGNOSIS — R0602 Shortness of breath: Secondary | ICD-10-CM

## 2017-12-01 DIAGNOSIS — J411 Mucopurulent chronic bronchitis: Secondary | ICD-10-CM | POA: Diagnosis not present

## 2017-12-01 NOTE — Progress Notes (Signed)
Subjective:  Patient ID: Catherine Harrell, female    DOB: [birth year]  Age: 72 y.o. MRN: [id number]  CC: Shortness of Breath (worsening today) and GI upset (nausea, vomting x loose stool)   HPI Catherine Harrell presents for 2 weeks of increasing shortness of breath.  For the last couple of days it has been associated with nausea.  She also has 1-2 loose bowel movements a day.  However the main concern is that the shortness of breath has crescendoed today.  He has dyspnea on exertion and at rest.  She has cough as well.  She denies fever chills and sweats.  The cough has been nonproductive.  Is able to go about a basic routine for her ADLs and was able to bring herself to the office without distress.  Depression screen [facility] 2/9 12/01/2017 08/31/2017 08/27/2017  Decreased Interest 0 0 0  Down, Depressed, Hopeless 0 0 0  PHQ - 2 Score 0 0 0    History Catherine Harrell has a past medical history of COPD (chronic obstructive pulmonary disease) ([place]), Hypertension, Osteoporosis, and TMJ arthralgia.   She has no past surgical history on file.   Her family history includes COPD in her father and sister; Cancer in her daughter; Diabetes in her mother and sister; Healthy in her son and son; Heart attack in her sister; Heart disease in her brother, father, and sister; Hypertension in her mother; Kidney disease in her daughter.She reports that she quit smoking about 12 years ago. Her smoking use included cigarettes. She has a 30.00 pack-year smoking history. she has never used smokeless tobacco. She reports that she does not drink alcohol or use drugs.    ROS Review of Systems  Constitutional: Negative for activity change, appetite change and fever.  HENT: Negative for congestion, rhinorrhea and sore throat.   Eyes: Negative for visual disturbance.  Respiratory: Positive for cough, shortness of breath and wheezing.   Cardiovascular: Negative for chest pain and palpitations.  Gastrointestinal: Positive for  diarrhea and nausea. Negative for abdominal pain.  Genitourinary: Negative for dysuria.  Musculoskeletal: Negative for arthralgias and myalgias.    Objective:  BP (!) 161/91 (BP Location: Left Arm, Patient Position: Sitting, Cuff Size: Normal)   Pulse (!) 110   Temp 97.8 F (36.6 C) (Oral)   Resp (!) 34   SpO2 96%   BP Readings from Last 3 Encounters:  12/01/17 (!) 161/91  08/31/17 134/81  08/27/17 (!) 152/79    Wt Readings from Last 3 Encounters:  08/31/17 136 lb (61.7 kg)  08/27/17 136 lb (61.7 kg)  05/22/17 134 lb (60.8 kg)     Physical Exam  Constitutional: She is oriented to person, place, and time. She appears well-developed and well-nourished. No distress.  HENT:  Head: Normocephalic and atraumatic.  Right Ear: External ear normal.  Left Ear: External ear normal.  Nose: Nose normal.  Mouth/Throat: Oropharynx is clear and moist.  Eyes: Conjunctivae and EOM are normal. Pupils are equal, round, and reactive to light.  Neck: Normal range of motion. Neck supple. No thyromegaly present.  Cardiovascular: Normal rate, regular rhythm and normal heart sounds.  No murmur heard. Pulmonary/Chest: She has wheezes. She has no rales.  Breath sounds are distant.  Rhonchi are present with inspiration but louder with expiration.  There is decreased expiratory phase  Abdominal: Soft. Bowel sounds are normal. She exhibits no distension. There is no tenderness.  Lymphadenopathy:    She has no cervical adenopathy.  Neurological: She is alert and  oriented to person, place, and time. She has normal reflexes.  Skin: Skin is warm and dry.  Psychiatric: She has a normal mood and affect. Her behavior is normal. Judgment and thought content normal.    Chest x-ray shows no acute infiltrate  Assessment & Plan:   Catherine Harrell was seen today for shortness of breath and gi upset.  Diagnoses and all orders for this visit:  SOB (shortness of breath) -     DG Chest 2 View -     CBC with  Differential/Platelet -     CMP14+EGFR  Mucopurulent chronic bronchitis (HCC) -     CBC with Differential/Platelet -     CMP14+EGFR       I have discontinued Catherine Harrell's gabapentin and azithromycin. I am also having her maintain her ipratropium-albuterol, ibandronate, predniSONE, and losartan.  Allergies as of 12/01/2017   No Known Allergies     Medication List        Accurate as of 12/01/17  8:19 PM. Always use your most recent med list.          ibandronate 150 MG tablet Commonly known as:  BONIVA Take 1 tablet (150 mg total) by mouth every 30 (thirty) days. Take w/ a full glass of water, on an empty stomach.  Do not take anything else po or lie down for the next 30 min.   ipratropium-albuterol 0.5-2.5 (3) MG/3ML Soln Commonly known as:  DUONEB Take 3 mLs by nebulization every 4 (four) hours as needed.   losartan 25 MG tablet Commonly known as:  COZAAR TAKE 1 TABLET (25 MG TOTAL) BY MOUTH DAILY.   predniSONE 10 MG tablet Commonly known as:  DELTASONE TAKE 1 TABLET (10 MG TOTAL) BY MOUTH DAILY WITH BREAKFAST.        Follow-up: Return in about 3 days (around 12/04/2017), or if symptoms worsen or fail to improve, for COPD.  Claretta Fraise, M.D.

## 2017-12-02 LAB — CBC WITH DIFFERENTIAL/PLATELET
Basophils Absolute: 0 10*3/uL (ref 0.0–0.2)
Basos: 0 %
EOS (ABSOLUTE): 0.7 10*3/uL — ABNORMAL HIGH (ref 0.0–0.4)
Eos: 7 %
Hematocrit: 41.9 % (ref 34.0–46.6)
Hemoglobin: 13.5 g/dL (ref 11.1–15.9)
Immature Grans (Abs): 0 10*3/uL (ref 0.0–0.1)
Immature Granulocytes: 0 %
Lymphocytes Absolute: 1.9 10*3/uL (ref 0.7–3.1)
Lymphs: 18 %
MCH: 29.3 pg (ref 26.6–33.0)
MCHC: 32.2 g/dL (ref 31.5–35.7)
MCV: 91 fL (ref 79–97)
Monocytes Absolute: 0.8 10*3/uL (ref 0.1–0.9)
Monocytes: 7 %
Neutrophils Absolute: 7.2 10*3/uL — ABNORMAL HIGH (ref 1.4–7.0)
Neutrophils: 68 %
Platelets: 357 10*3/uL (ref 150–379)
RBC: 4.6 x10E6/uL (ref 3.77–5.28)
RDW: 13 % (ref 12.3–15.4)
WBC: 10.7 10*3/uL (ref 3.4–10.8)

## 2017-12-02 LAB — CMP14+EGFR
ALT: 7 IU/L (ref 0–32)
AST: 13 IU/L (ref 0–40)
Albumin/Globulin Ratio: 1.4 (ref 1.2–2.2)
Albumin: 4.3 g/dL (ref 3.5–4.8)
Alkaline Phosphatase: 72 IU/L (ref 39–117)
BUN/Creatinine Ratio: 9 — ABNORMAL LOW (ref 12–28)
BUN: 7 mg/dL — ABNORMAL LOW (ref 8–27)
Bilirubin Total: 0.3 mg/dL (ref 0.0–1.2)
CO2: 23 mmol/L (ref 20–29)
Calcium: 9.9 mg/dL (ref 8.7–10.3)
Chloride: 100 mmol/L (ref 96–106)
Creatinine, Ser: 0.77 mg/dL (ref 0.57–1.00)
GFR calc Af Amer: 89 mL/min/{1.73_m2} (ref >59)
GFR calc non Af Amer: 77 mL/min/{1.73_m2} (ref >59)
Globulin, Total: 3 g/dL (ref 1.5–4.5)
Glucose: 86 mg/dL (ref 65–99)
Potassium: 4.9 mmol/L (ref 3.5–5.2)
Sodium: 140 mmol/L (ref 134–144)
Total Protein: 7.3 g/dL (ref 6.0–8.5)

## 2017-12-04 ENCOUNTER — Ambulatory Visit (INDEPENDENT_AMBULATORY_CARE_PROVIDER_SITE_OTHER): Payer: Medicare HMO | Admitting: Family Medicine

## 2017-12-04 ENCOUNTER — Encounter: Payer: Self-pay | Admitting: Family Medicine

## 2017-12-04 VITALS — BP 134/77 | HR 100 | Temp 96.9°F | Ht 62.75 in | Wt 138.0 lb

## 2017-12-04 DIAGNOSIS — J441 Chronic obstructive pulmonary disease with (acute) exacerbation: Secondary | ICD-10-CM | POA: Diagnosis not present

## 2017-12-04 MED ORDER — BUDESONIDE-FORMOTEROL FUMARATE 160-4.5 MCG/ACT IN AERO: 2.0000 | INHALATION_SPRAY | Freq: Two times a day (BID) | RESPIRATORY_TRACT | 12 refills | Status: DC

## 2017-12-04 MED ORDER — METHYLPREDNISOLONE ACETATE 80 MG/ML IJ SUSP
80.0000 mg | Freq: Once | INTRAMUSCULAR | Status: AC
  Administered 2017-12-04: 80 mg via INTRAMUSCULAR

## 2017-12-04 NOTE — Progress Notes (Signed)
Subjective:  Patient ID: Greenlee Ancheta, female    DOB: 1946/05/14  Age: 72 y.o. MRN: [id number]  CC: Follow-up (pt here today for a 3 day follow up of SOB and pt states she is feeling better)   HPI Sanja Elizardo presents for Recheck of her exacerbation of her COPD.  She has been very short of breath earlier in the week.  But shortness of breath has essentially resolved.  She has a baseline shortness of breath and is still working with that.  However she has no dyspnea at rest currently.  She has her baseline dyspnea with exertion or walking more than a block or 2.  It is difficult for her to climb steps.  She does continue to have some cough.  She has noticed a faint amount of wheezing only.  Depression screen [facility] 2/9 12/01/2017 08/31/2017 08/27/2017  Decreased Interest 0 0 0  Down, Depressed, Hopeless 0 0 0  PHQ - 2 Score 0 0 0    History [name] has a past medical history of COPD (chronic obstructive pulmonary disease) (HCC), Hypertension, Osteoporosis, and TMJ arthralgia.   She has no past surgical history on file.   Her family history includes COPD in her father and sister; Cancer in her daughter; Diabetes in her mother and sister; Healthy in her son and son; Heart attack in her sister; Heart disease in her brother, father, and sister; Hypertension in her mother; Kidney disease in her daughter.She reports that she quit smoking about 12 years ago. Her smoking use included cigarettes. She has a 30.00 pack-year smoking history. she has never used smokeless tobacco. She reports that she does not drink alcohol or use drugs.    ROS Review of Systems  Constitutional: Negative for activity change, appetite change and fever.  HENT: Negative for congestion, rhinorrhea and sore throat.   Eyes: Negative for visual disturbance.  Respiratory: Positive for shortness of breath (Much improved) and wheezing (Diminished from initial check 3 days ago). Negative for cough.   Cardiovascular:  Negative for chest pain and palpitations.  Gastrointestinal: Negative for abdominal pain, diarrhea and nausea.  Genitourinary: Negative for dysuria.  Musculoskeletal: Negative for arthralgias and myalgias.    Objective:  BP 134/77   Pulse 100   Temp (!) 96.9 F (36.1 C) (Oral)   Ht 5' 2.75" (1.594 m)   Wt 138 lb (62.6 kg)   SpO2 95%   BMI 24.64 kg/m    BP Readings from Last 3 Encounters:  12/04/17 134/77  12/01/17 (!) 161/91  08/31/17 134/81    Wt Readings from Last 3 Encounters:  12/04/17 138 lb (62.6 kg)  08/31/17 136 lb (61.7 kg)  08/27/17 136 lb (61.7 kg)     Physical Exam  Constitutional: She is oriented to person, place, and time. She appears well-developed and well-nourished. No distress.  Elderly, thin female in no acute distress  HENT:  Head: Normocephalic and atraumatic.  Eyes: Conjunctivae are normal. Pupils are equal, round, and reactive to light.  Neck: Normal range of motion. Neck supple. No thyromegaly present.  Cardiovascular: Normal rate, regular rhythm and normal heart sounds.  No murmur heard. Pulmonary/Chest: Effort normal. No respiratory distress. She has wheezes (Few faint wheezes and rhonchi with diminished exchange). She has no rales.  Abdominal: Soft. Bowel sounds are normal. She exhibits no distension. There is no tenderness.  Musculoskeletal: Normal range of motion.  Lymphadenopathy:    She has no cervical adenopathy.  Neurological: She is alert and oriented to person,  place, and time.  Skin: Skin is warm and dry. She is not diaphoretic.  Psychiatric: She has a normal mood and affect. Her behavior is normal. Judgment and thought content normal.      Assessment & Plan:   Wynona CanesChristine was seen today for follow-up.  Diagnoses and all orders for this visit:  Chronic obstructive pulmonary disease with acute exacerbation (HCC) -     methylPREDNISolone acetate (DEPO-MEDROL) injection 80 mg  Other orders -     budesonide-formoterol  (SYMBICORT) 160-4.5 MCG/ACT inhaler; Inhale 2 puffs into the lungs 2 (two) times daily.       I am having Mikey Kirschnerhristine Borak start on budesonide-formoterol. I am also having her maintain her ipratropium-albuterol, ibandronate, losartan, and predniSONE. We administered methylPREDNISolone acetate.  Allergies as of 12/04/2017   No Known Allergies     Medication List        Accurate as of 12/04/17 11:59 PM. Always use your most recent med list.          budesonide-formoterol 160-4.5 MCG/ACT inhaler Commonly known as:  SYMBICORT Inhale 2 puffs into the lungs 2 (two) times daily.   ibandronate 150 MG tablet Commonly known as:  BONIVA Take 1 tablet (150 mg total) by mouth every 30 (thirty) days. Take w/ a full glass of water, on an empty stomach.  Do not take anything else po or lie down for the next 30 min.   ipratropium-albuterol 0.5-2.5 (3) MG/3ML Soln Commonly known as:  DUONEB Take 3 mLs by nebulization every 4 (four) hours as needed.   losartan 25 MG tablet Commonly known as:  COZAAR TAKE 1 TABLET (25 MG TOTAL) BY MOUTH DAILY.   predniSONE 10 MG tablet Commonly known as:  DELTASONE        Follow-up: Return if symptoms worsen or fail to improve.  Mechele ClaudeWarren Jeff Frieden, M.D.

## 2017-12-10 ENCOUNTER — Encounter: Payer: Self-pay | Admitting: Family Medicine

## 2017-12-18 ENCOUNTER — Ambulatory Visit (INDEPENDENT_AMBULATORY_CARE_PROVIDER_SITE_OTHER): Payer: Medicare HMO | Admitting: Family Medicine

## 2017-12-18 ENCOUNTER — Encounter: Payer: Self-pay | Admitting: Family Medicine

## 2017-12-18 VITALS — BP 141/70 | HR 93 | Temp 97.2°F | Ht 62.75 in | Wt 137.0 lb

## 2017-12-18 DIAGNOSIS — J441 Chronic obstructive pulmonary disease with (acute) exacerbation: Secondary | ICD-10-CM | POA: Diagnosis not present

## 2017-12-18 DIAGNOSIS — H9113 Presbycusis, bilateral: Secondary | ICD-10-CM

## 2017-12-18 DIAGNOSIS — M26629 Arthralgia of temporomandibular joint, unspecified side: Secondary | ICD-10-CM

## 2017-12-18 MED ORDER — LOSARTAN POTASSIUM 25 MG PO TABS: 25.0000 mg | ORAL_TABLET | Freq: Every day | ORAL | 1 refills | Status: DC

## 2017-12-18 MED ORDER — IPRATROPIUM-ALBUTEROL 0.5-2.5 (3) MG/3ML IN SOLN: 3.0000 mL | RESPIRATORY_TRACT | 5 refills | Status: DC | PRN

## 2017-12-18 MED ORDER — FLUTICASONE-UMECLIDIN-VILANT 100-62.5-25 MCG/INH IN AEPB: 1.0000 | INHALATION_SPRAY | Freq: Every day | RESPIRATORY_TRACT | 11 refills | Status: DC

## 2017-12-18 NOTE — Progress Notes (Signed)
Subjective:  Patient ID: Catherine Harrell Harrell, female    DOB: 12/12/1945  Age: 72 y.o. MRN: [id number]  CC: Follow-up (pt here today for 2 week recheck of her COPD and says she is feeling better)   HPI Catherine Harrell Plouff presents for follow-up of her COPD exacerbation.  Her shortness of breath has improved.  She is at baseline for shortness of breath with a normal pulse ox on room air.  She states that she could not tolerate the Symbicort.  It hurts her jaw because she suffers from TMJ syndrome particularly when she has to take a deep breath and again when she has to gargle and spit out after rinsing her mouth out to avoid fungal infection.  She never spits under other circumstances and does not want to due to this circumstance because of the ongoing pain.  However she does need relief from the shortness of breath and wonders if there is an alternative treatment that might be useful. [facility] 2[facility] 12/18/2017 12/01/2017 08/31/2017  Decreased Interest 0 0 0  Down, Depressed, Hopeless 0 0 0  PHQ - 2 Score 0 0 0    History Catherine CanesChristine has a past medical history of COPD (chronic obstructive pulmonary disease) (HCC), Hypertension, Osteoporosis, and TMJ arthralgia.   She has no past surgical history on file.   Her family history includes COPD in her father and sister; Cancer in her daughter; Diabetes in her mother and sister; Healthy in her son and son; Heart attack in her sister; Heart disease in her brother, father, and sister; Hypertension in her mother; Kidney disease in her daughter.She reports that she quit smoking about 12 years ago. Her smoking use included cigarettes. She has a 30.00 pack-year smoking history. she has never used smokeless tobacco. She reports that she does not drink alcohol or use drugs.    ROS Review of Systems  Constitutional: Negative for activity change, appetite change and fever.  HENT: Positive for trouble swallowing (Exacerbated by TMJ). Negative for congestion,  rhinorrhea and sore throat.        Unable to spit.  She has painful TMJ exacerbated by spitting  Eyes: Negative for visual disturbance.  Respiratory: Negative for cough and shortness of breath.   Cardiovascular: Negative for chest pain and palpitations.  Gastrointestinal: Negative for abdominal pain, diarrhea and nausea.  Genitourinary: Negative for dysuria.  Musculoskeletal: Negative for arthralgias and myalgias.    Objective:  BP (!) 141/70   Pulse 93   Temp (!) 97.2 F (36.2 C) (Oral)   Ht 5' 2.75" (1.594 m)   Wt 137 lb (62.1 kg)   SpO2 95%   BMI 24.46 kg/m   BP Readings from Last 3 Encounters:  12/18/17 (!) 141/70  12/04/17 134/77  12/01/17 (!) 161/91    Wt Readings from Last 3 Encounters:  12/18/17 137 lb (62.1 kg)  12/04/17 138 lb (62.6 kg)  08/31/17 136 lb (61.7 kg)     Physical Exam  Constitutional: She is oriented to person, place, and time. She appears well-developed and well-nourished. No distress.  HENT:  Head: Normocephalic and atraumatic.  Right Ear: External ear normal. Decreased hearing is noted.  Left Ear: External ear normal. Decreased hearing is noted.  Nose: Nose normal.  Mouth/Throat: Oropharynx is clear and moist.  Eyes: Conjunctivae and EOM are normal. Pupils are equal, round, and reactive to light.  Neck: Normal range of motion. Neck supple. No thyromegaly present.  Cardiovascular: Normal rate, regular rhythm and normal heart sounds.  No murmur heard. Pulmonary/Chest: Effort normal. No respiratory distress.  Breath sounds are distant with an occasional rhonchus only.  There is decreased expiratory phase inspiration to expiration ratio is 1: 1  Abdominal: Soft. Bowel sounds are normal. She exhibits no distension. There is no tenderness.  Musculoskeletal: Normal range of motion. She exhibits tenderness (For percussion of TMJ bilaterally).  Lymphadenopathy:    She has no cervical adenopathy.  Neurological: She is alert and oriented to person,  place, and time. She has normal reflexes.  Skin: Skin is warm and dry.  Psychiatric: She has a normal mood and affect. Her behavior is normal. Thought content normal.      Assessment & Plan:   Catherine Harrell was seen today for follow-up.  Diagnoses and all orders for this visit:  Chronic obstructive pulmonary disease with acute exacerbation (HCC)  TMJ syndrome  Presbycusis of both ears  Other orders -     Fluticasone-Umeclidin-Vilant (TRELEGY ELLIPTA) 100-62.5-25 MCG/INH AEPB; Inhale 1 puff into the lungs daily. -     ipratropium-albuterol (DUONEB) 0.5-2.5 (3) MG/3ML SOLN; Take 3 mLs by nebulization every 4 (four) hours as needed. -     losartan (COZAAR) 25 MG tablet; Take 1 tablet (25 mg total) by mouth daily.    Patient was encouraged to see cardiology help for her presbycusis and need for hearing aids.  I have discontinued Catherine Prowell's budesonide-formoterol. I am also having her start on Fluticasone-Umeclidin-Vilant. Additionally, I am having her maintain her ibandronate, predniSONE, gabapentin, ipratropium-albuterol, and losartan.  Allergies as of 12/18/2017   No Known Allergies     Medication List        Accurate as of 12/18/17  6:30 PM. Always use your most recent med list.          Fluticasone-Umeclidin-Vilant 100-62.5-25 MCG/INH Aepb Commonly known as:  TRELEGY ELLIPTA Inhale 1 puff into the lungs daily.   gabapentin 300 MG capsule Commonly known as:  NEURONTIN Take 300 mg by mouth 3 (three) times daily.   ibandronate 150 MG tablet Commonly known as:  BONIVA Take 1 tablet (150 mg total) by mouth every 30 (thirty) days. Take w/ a full glass of water, on an empty stomach.  Do not take anything else po or lie down for the next 30 min.   ipratropium-albuterol 0.5-2.5 (3) MG/3ML Soln Commonly known as:  DUONEB Take 3 mLs by nebulization every 4 (four) hours as needed.   losartan 25 MG tablet Commonly known as:  COZAAR Take 1 tablet (25 mg total) by mouth  daily.   predniSONE 10 MG tablet Commonly known as:  DELTASONE        Follow-up: Return in about 3 months (around 03/18/2018), or if symptoms worsen or fail to improve.  [name], M.D.

## 2017-12-31 ENCOUNTER — Other Ambulatory Visit: Payer: Self-pay | Admitting: Family Medicine

## 2017-12-31 NOTE — Telephone Encounter (Signed)
Last seen 12/18/17  Dr Stacks 

## 2018-01-18 ENCOUNTER — Encounter: Payer: Self-pay | Admitting: Family Medicine

## 2018-01-18 ENCOUNTER — Ambulatory Visit (INDEPENDENT_AMBULATORY_CARE_PROVIDER_SITE_OTHER): Payer: Medicare HMO | Admitting: Family Medicine

## 2018-01-18 VITALS — BP 122/74 | HR 92 | Temp 97.6°F | Ht 62.75 in | Wt 136.2 lb

## 2018-01-18 DIAGNOSIS — J441 Chronic obstructive pulmonary disease with (acute) exacerbation: Secondary | ICD-10-CM | POA: Diagnosis not present

## 2018-01-18 MED ORDER — IBANDRONATE SODIUM 150 MG PO TABS: 150.0000 mg | ORAL_TABLET | ORAL | 3 refills | Status: DC

## 2018-01-18 NOTE — Addendum Note (Signed)
Addended by: Margurite AuerbachOMPTON, Anzal Bartnick G on: 01/18/2018 11:20 AM   Modules accepted: Orders

## 2018-01-18 NOTE — Progress Notes (Signed)
Subjective:  Patient ID: Catherine Harrell, female    DOB: [birth year]  Age: 72 y.o. MRN: [id number]  CC: Follow-up (pt here today for 1 month f/u on her COPD and she state she hasn't been nearly as SOB )   HPI Catherine Harrell presents for follow-up of her COPD.  She says she is no longer short of breath.  She can go about her daily routine easily.  She is using the trelegy 1 inhalation daily.  It is working quite well for her.  Her only concern is its expense.  She is says that she has not even needed the DuoNeb since she was here last month.  She has relegated it to as needed use only.  Fortunately no exacerbations occurred.  She is continuing on the prednisone 10 mg daily.  She denies any side effects of affecting her appetite swelling in the legs or lower extremity discomfort.  No nausea or lack of sleep.  Depression screen [facility] 2/9 01/18/2018 12/18/2017 12/01/2017  Decreased Interest 0 0 0  Down, Depressed, Hopeless 0 0 0  PHQ - 2 Score 0 0 0    History Catherine Canes has a past medical history of COPD (chronic obstructive pulmonary disease) (HCC), Hypertension, Osteoporosis, and TMJ arthralgia.   She has no past surgical history on file.   Her family history includes COPD in her father and sister; Cancer in her daughter; Diabetes in her mother and sister; Healthy in her son and son; Heart attack in her sister; Heart disease in her brother, father, and sister; Hypertension in her mother; Kidney disease in her daughter.She reports that she quit smoking about 13 years ago. Her smoking use included cigarettes. She has a 30.00 pack-year smoking history. she has never used smokeless tobacco. She reports that she does not drink alcohol or use drugs.    ROS Review of Systems  Constitutional: Negative for activity change, appetite change and fever.  HENT: Negative for congestion, rhinorrhea and sore throat.   Eyes: Negative for visual disturbance.  Respiratory: Negative for cough and shortness of  breath.   Cardiovascular: Negative for chest pain and palpitations.  Gastrointestinal: Negative for abdominal pain, diarrhea and nausea.  Genitourinary: Negative for dysuria.  Musculoskeletal: Negative for arthralgias and myalgias.    Objective:  BP 122/74   Pulse 92   Temp 97.6 F (36.4 C) (Oral)   Ht 5' 2.75" (1.594 m)   Wt 136 lb 4 oz (61.8 kg)   SpO2 97%   BMI 24.33 kg/m   BP Readings from Last 3 Encounters:  01/18/18 122/74  12/18/17 (!) 141/70  12/04/17 134/77    Wt Readings from Last 3 Encounters:  01/18/18 136 lb 4 oz (61.8 kg)  12/18/17 137 lb (62.1 kg)  12/04/17 138 lb (62.6 kg)     Physical Exam  Constitutional: She is oriented to person, place, and time. She appears well-developed and well-nourished. No distress.  HENT:  Head: Normocephalic and atraumatic.  Right Ear: External ear normal.  Left Ear: External ear normal.  Nose: Nose normal.  Mouth/Throat: Oropharynx is clear and moist.  Eyes: Conjunctivae and EOM are normal. Pupils are equal, round, and reactive to light.  Neck: Normal range of motion. Neck supple. No thyromegaly present.  Cardiovascular: Normal rate, regular rhythm and normal heart sounds.  No murmur heard. Pulmonary/Chest: Effort normal and breath sounds normal. No respiratory distress. She has no wheezes. She has no rales.  Abdominal: Soft. Bowel sounds are normal. She exhibits no distension. There  is no tenderness.  Lymphadenopathy:    She has no cervical adenopathy.  Neurological: She is alert and oriented to person, place, and time. She has normal reflexes. She exhibits abnormal muscle tone (She has a bit of tremor about the hands and the head).  Skin: Skin is warm and dry.  Psychiatric: She has a normal mood and affect. Her behavior is normal. Judgment and thought content normal.   Pulmonary  function test ordered, reviewed.  Report attached   Assessment & Plan:   Catherine Harrell was seen today for follow-up.  Diagnoses and all  orders for this visit:  Chronic obstructive pulmonary disease with acute exacerbation (HCC) -     PR BREATHING CAPACITY TEST    I am having Catherine Harrell maintain her ibandronate, gabapentin, Fluticasone-Umeclidin-Vilant, ipratropium-albuterol, losartan, and predniSONE.  Allergies as of 01/18/2018   No Known Allergies     Medication List        Accurate as of 01/18/18 11:03 AM. Always use your most recent med list.          Fluticasone-Umeclidin-Vilant 100-62.5-25 MCG/INH Aepb Commonly known as:  TRELEGY ELLIPTA Inhale 1 puff into the lungs daily.   gabapentin 300 MG capsule Commonly known as:  NEURONTIN Take 300 mg by mouth 3 (three) times daily.   ibandronate 150 MG tablet Commonly known as:  BONIVA Take 1 tablet (150 mg total) by mouth every 30 (thirty) days. Take w/ a full glass of water, on an empty stomach.  Do not take anything else po or lie down for the next 30 min.   ipratropium-albuterol 0.5-2.5 (3) MG/3ML Soln Commonly known as:  DUONEB Take 3 mLs by nebulization every 4 (four) hours as needed.   losartan 25 MG tablet Commonly known as:  COZAAR Take 1 tablet (25 mg total) by mouth daily.   predniSONE 10 MG tablet Commonly known as:  DELTASONE TAKE 1 TABLET (10 MG TOTAL) BY MOUTH DAILY WITH BREAKFAST.        Follow-up: Return in about 3 months (around 04/17/2018) for COPD, hypertension.  Mechele ClaudeWarren Shoichi Mielke, M.D.

## 2018-02-11 ENCOUNTER — Other Ambulatory Visit: Payer: Self-pay | Admitting: Family Medicine

## 2018-02-11 NOTE — Telephone Encounter (Signed)
Last seen 01/18/18  Dr Time Warnerstacks

## 2018-02-11 NOTE — Telephone Encounter (Signed)
Forward to pcp/Stacks 

## 2018-03-18 ENCOUNTER — Other Ambulatory Visit: Payer: Self-pay | Admitting: Family Medicine

## 2018-03-18 NOTE — Telephone Encounter (Signed)
Last seen 01/18/18  Dr Darlyn ReadStacks

## 2018-04-13 ENCOUNTER — Encounter: Payer: Self-pay | Admitting: Family Medicine

## 2018-04-13 ENCOUNTER — Ambulatory Visit (INDEPENDENT_AMBULATORY_CARE_PROVIDER_SITE_OTHER): Payer: Medicare HMO | Admitting: Family Medicine

## 2018-04-13 ENCOUNTER — Ambulatory Visit (INDEPENDENT_AMBULATORY_CARE_PROVIDER_SITE_OTHER): Payer: Medicare HMO

## 2018-04-13 VITALS — BP 138/82 | HR 95 | Temp 98.1°F | Ht 62.0 in | Wt 137.0 lb

## 2018-04-13 DIAGNOSIS — M25551 Pain in right hip: Secondary | ICD-10-CM

## 2018-04-13 DIAGNOSIS — W19XXXA Unspecified fall, initial encounter: Secondary | ICD-10-CM

## 2018-04-13 DIAGNOSIS — S79911A Unspecified injury of right hip, initial encounter: Secondary | ICD-10-CM | POA: Diagnosis not present

## 2018-04-13 NOTE — Patient Instructions (Signed)
Your x-ray did not demonstrate any acute fractures.  I am still waiting for the radiologist to give his formal review and I will contact you once he has read your x-ray.  In the meantime since her symptoms are getting better with ibuprofen, ice/heat and Biofreeze, you can keep doing these remedies.  Like we discussed, you can use the ibuprofen up to 3 times a day if needed but make sure that you are eating with it and drinking plenty of water.  If your symptoms are worsening or not getting better within the next several weeks, we can plan to send you to physical therapy.  Keep your appointment with your primary care doctor.   Fall Prevention in the Home Falls can cause injuries. They can happen to people of all ages. There are many things you can do to make your home safe and to help prevent falls. What can I do on the outside of my home?  Regularly fix the edges of walkways and driveways and fix any cracks.  Remove anything that might make you trip as you walk through a door, such as a raised step or threshold.  Trim any bushes or trees on the path to your home.  Use bright outdoor lighting.  Clear any walking paths of anything that might make someone trip, such as rocks or tools.  Regularly check to see if handrails are loose or broken. Make sure that both sides of any steps have handrails.  Any raised decks and porches should have guardrails on the edges.  Have any leaves, snow, or ice cleared regularly.  Use sand or salt on walking paths during winter.  Clean up any spills in your garage right away. This includes oil or grease spills. What can I do in the bathroom?  Use night lights.  Install grab bars by the toilet and in the tub and shower. Do not use towel bars as grab bars.  Use non-skid mats or decals in the tub or shower.  If you need to sit down in the shower, use a plastic, non-slip stool.  Keep the floor dry. Clean up any water that spills on the floor as soon as  it happens.  Remove soap buildup in the tub or shower regularly.  Attach bath mats securely with double-sided non-slip rug tape.  Do not have throw rugs and other things on the floor that can make you trip. What can I do in the bedroom?  Use night lights.  Make sure that you have a light by your bed that is easy to reach.  Do not use any sheets or blankets that are too big for your bed. They should not hang down onto the floor.  Have a firm chair that has side arms. You can use this for support while you get dressed.  Do not have throw rugs and other things on the floor that can make you trip. What can I do in the kitchen?  Clean up any spills right away.  Avoid walking on wet floors.  Keep items that you use a lot in easy-to-reach places.  If you need to reach something above you, use a strong step stool that has a grab bar.  Keep electrical cords out of the way.  Do not use floor polish or wax that makes floors slippery. If you must use wax, use non-skid floor wax.  Do not have throw rugs and other things on the floor that can make you trip. What can I do  with my stairs?  Do not leave any items on the stairs.  Make sure that there are handrails on both sides of the stairs and use them. Fix handrails that are broken or loose. Make sure that handrails are as long as the stairways.  Check any carpeting to make sure that it is firmly attached to the stairs. Fix any carpet that is loose or worn.  Avoid having throw rugs at the top or bottom of the stairs. If you do have throw rugs, attach them to the floor with carpet tape.  Make sure that you have a light switch at the top of the stairs and the bottom of the stairs. If you do not have them, ask someone to add them for you. What else can I do to help prevent falls?  Wear shoes that: ? Do not have high heels. ? Have rubber bottoms. ? Are comfortable and fit you well. ? Are closed at the toe. Do not wear sandals.  If  you use a stepladder: ? Make sure that it is fully opened. Do not climb a closed stepladder. ? Make sure that both sides of the stepladder are locked into place. ? Ask someone to hold it for you, if possible.  Clearly mark and make sure that you can see: ? Any grab bars or handrails. ? First and last steps. ? Where the edge of each step is.  Use tools that help you move around (mobility aids) if they are needed. These include: ? Canes. ? Walkers. ? Scooters. ? Crutches.  Turn on the lights when you go into a dark area. Replace any light bulbs as soon as they burn out.  Set up your furniture so you have a clear path. Avoid moving your furniture around.  If any of your floors are uneven, fix them.  If there are any pets around you, be aware of where they are.  Review your medicines with your doctor. Some medicines can make you feel dizzy. This can increase your chance of falling. Ask your doctor what other things that you can do to help prevent falls. This information is not intended to replace advice given to you by your health care provider. Make sure you discuss any questions you have with your health care provider. Document Released: 09/06/2009 Document Revised: 04/17/2016 Document Reviewed: 12/15/2014 Elsevier Interactive Patient Education  [employer].

## 2018-04-13 NOTE — Progress Notes (Signed)
Subjective: CC: Fall PCP: [name], MD [id number] Catherine Harrell is a 72 y.o. female presenting to clinic today for:  1. Fall Patient reports that she sustained a mechanical fall on Saturday.  She notes she actually fell on her left side but twisted her right lower extremity.  She denies hitting her head.  She notes some soreness along the right gluteus radiating to the right lateral aspect of the hip.  She notes that this is worse with certain movements.  She does report that overall it seems to be getting better.  She has been applying ice and heat and using 400 mg of Advil daily.  Denies any lower extremity weakness, numbness or tingling.  She reports compliance with her Boniva tablet for osteoporosis.   ROS: Per HPI  No Known Allergies Past Medical History:  Diagnosis Date  . COPD (chronic obstructive pulmonary disease) (HCC)   . Hypertension   . Osteoporosis   . TMJ arthralgia    left    Current Outpatient Medications:  .  Fluticasone-Umeclidin-Vilant (TRELEGY ELLIPTA) 100-62.5-25 MCG/INH AEPB, Inhale 1 puff into the lungs daily., Disp: 1 each, Rfl: 11 .  gabapentin (NEURONTIN) 300 MG capsule, Take 300 mg by mouth 3 (three) times daily., Disp: , Rfl:  .  ibandronate (BONIVA) 150 MG tablet, Take 1 tablet (150 mg total) by mouth every 30 (thirty) days. Take w/ a full glass of water, on an empty stomach.  Do not take anything else po or lie down for the next 30 min., Disp: 3 tablet, Rfl: 3 .  ipratropium-albuterol (DUONEB) 0.5-2.5 (3) MG/3ML SOLN, Take 3 mLs by nebulization every 4 (four) hours as needed., Disp: 360 mL, Rfl: 5 .  losartan (COZAAR) 25 MG tablet, Take 1 tablet (25 mg total) by mouth daily., Disp: 90 tablet, Rfl: 1 .  predniSONE (DELTASONE) 5 MG tablet, TAKE 2 TABLETS BY MOUTH DAILY WITH BREAKFAST., Disp: 60 tablet, Rfl: 0 Social History   Socioeconomic History  . Marital status: Married    Spouse name: Not on file  . Number of children: Not on file  . Years of  education: Not on file  . Highest education level: Not on file  Occupational History  . Not on file  Social Needs  . Financial resource strain: Not on file  . Food insecurity:    Worry: Not on file    Inability: Not on file  . Transportation needs:    Medical: Not on file    Non-medical: Not on file  Tobacco Use  . Smoking status: Former Smoker    Packs/day: 1.00    Years: 30.00    Pack years: 30.00    Types: Cigarettes    Last attempt to quit: 01/16/2005    Years since quitting: 13.2  . Smokeless tobacco: Never Used  Substance and Sexual Activity  . Alcohol use: No  . Drug use: No  . Sexual activity: Never  Lifestyle  . Physical activity:    Days per week: Not on file    Minutes per session: Not on file  . Stress: Not on file  Relationships  . Social connections:    Talks on phone: Not on file    Gets together: Not on file    Attends religious service: Not on file    Active member of club or organization: Not on file    Attends meetings of clubs or organizations: Not on file    Relationship status: Not on file  . Intimate partner violence:  Fear of current or ex partner: Not on file    Emotionally abused: Not on file    Physically abused: Not on file    Forced sexual activity: Not on file  Other Topics Concern  . Not on file  Social History Narrative  . Not on file   Family History  Problem Relation Age of Onset  . Heart disease Father   . COPD Father   . Heart disease Sister   . Heart attack Sister   . Diabetes Sister   . COPD Sister   . Heart disease Brother   . Cancer Daughter        breast  . Hypertension Mother   . Diabetes Mother   . Healthy Son   . Kidney disease Daughter   . Healthy Son     Objective: Office vital signs reviewed. BP 138/82   Pulse 95   Temp 98.1 F (36.7 C) (Oral)   Ht  (1.575 m)   Wt 137 lb (62.1 kg)   BMI 25.06 kg/m   Physical Examination:  General: Awake, alert, well nourished, No acute  distress Extremities: warm, well perfused, No edema, cyanosis or clubbing; +2 pulses bilaterally MSK: normal gait and normal station   Lumbar spine: No midline tenderness to palpation.  No paraspinal muscle tenderness to palpation.  Active range of motion preserved.  She does have mild tenderness to palpation over the sacroiliac joint and into the right gluteus.  Right hip: No tenderness to palpation to the greater trochanter.  No palpable bony abnormalities. Skin: dry; intact; no rashes or lesions Neuro: 4/5 LE Strength and light touch sensation grossly intact  No results found.  Assessment/ Plan: 72 y.o. female   1. Fall, initial encounter Personal review of x-ray did not demonstrate any acute fractures or dislocations.  Radiology review pending.  Her physical exam was remarkable for tenderness to palpation over the SI joint and along the right gluteus.  I suspect more of a strain.  Since symptoms are improving with over-the-counter therapies, I encouraged her to continue these.  May increase ibuprofen up to 3 times daily if needed.  Take with food and plenty of water.  Home care instructions were reviewed.  If symptoms plateau and do not improve, could consider referral to physical therapy.  Will contact patient with results of x-ray once formal read has been completed.  Follow-up as needed. - DG HIP UNILAT W OR W/O PELVIS 2-3 VIEWS RIGHT; Future  2. Right hip pain   Orders Placed This Encounter  Procedures  . DG HIP UNILAT W OR W/O PELVIS 2-3 VIEWS RIGHT    Standing Status:   Future    Number of Occurrences:   1    Standing Expiration Date:   06/12/2019    Order Specific Question:   Reason for Exam (SYMPTOM  OR DIAGNOSIS REQUIRED)    Answer:   fall    Order Specific Question:   Preferred imaging location?    Answer:   Internal      Raliegh Ip, DO [facility] [phone]

## 2018-04-16 ENCOUNTER — Other Ambulatory Visit: Payer: Self-pay | Admitting: Family Medicine

## 2018-04-16 ENCOUNTER — Other Ambulatory Visit: Payer: Medicare HMO

## 2018-04-16 DIAGNOSIS — I10 Essential (primary) hypertension: Secondary | ICD-10-CM | POA: Diagnosis not present

## 2018-04-16 DIAGNOSIS — J441 Chronic obstructive pulmonary disease with (acute) exacerbation: Secondary | ICD-10-CM | POA: Diagnosis not present

## 2018-04-16 NOTE — Telephone Encounter (Signed)
Refill sent x1 month. Needs OV w/ PCP.  I saw her last visit for an acute issue.  Last OV for COPD February 2019.

## 2018-04-16 NOTE — Telephone Encounter (Signed)
Dr Darlyn Read PCP   Last seen 04/13/18

## 2018-04-17 LAB — CBC WITH DIFFERENTIAL/PLATELET
BASOS ABS: 0.1 10*3/uL (ref 0.0–0.2)
BASOS: 1 %
EOS (ABSOLUTE): 0.8 10*3/uL — AB (ref 0.0–0.4)
Eos: 10 %
Hematocrit: 40.6 % (ref 34.0–46.6)
Hemoglobin: 13.4 g/dL (ref 11.1–15.9)
IMMATURE GRANS (ABS): 0 10*3/uL (ref 0.0–0.1)
IMMATURE GRANULOCYTES: 1 %
LYMPHS: 31 %
Lymphocytes Absolute: 2.6 10*3/uL (ref 0.7–3.1)
MCH: 30 pg (ref 26.6–33.0)
MCHC: 33 g/dL (ref 31.5–35.7)
MCV: 91 fL (ref 79–97)
MONOCYTES: 9 %
Monocytes Absolute: 0.7 10*3/uL (ref 0.1–0.9)
NEUTROS PCT: 48 %
Neutrophils Absolute: 4.1 10*3/uL (ref 1.4–7.0)
PLATELETS: 349 10*3/uL (ref 150–450)
RBC: 4.47 x10E6/uL (ref 3.77–5.28)
RDW: 13.1 % (ref 12.3–15.4)
WBC: 8.2 10*3/uL (ref 3.4–10.8)

## 2018-04-17 LAB — CMP14+EGFR
A/G RATIO: 1.4 (ref 1.2–2.2)
ALT: 10 IU/L (ref 0–32)
AST: 13 IU/L (ref 0–40)
Albumin: 4.2 g/dL (ref 3.5–4.8)
Alkaline Phosphatase: 55 IU/L (ref 39–117)
BUN/Creatinine Ratio: 13 (ref 12–28)
BUN: 12 mg/dL (ref 8–27)
Bilirubin Total: 0.3 mg/dL (ref 0.0–1.2)
CHLORIDE: 101 mmol/L (ref 96–106)
CO2: 24 mmol/L (ref 20–29)
Calcium: 10.1 mg/dL (ref 8.7–10.3)
Creatinine, Ser: 0.9 mg/dL (ref 0.57–1.00)
GFR calc non Af Amer: 64 mL/min/{1.73_m2} (ref >59)
GFR, EST AFRICAN AMERICAN: 73 mL/min/{1.73_m2} (ref >59)
GLUCOSE: 88 mg/dL (ref 65–99)
Globulin, Total: 2.9 g/dL (ref 1.5–4.5)
POTASSIUM: 5.5 mmol/L — AB (ref 3.5–5.2)
Sodium: 141 mmol/L (ref 134–144)
TOTAL PROTEIN: 7.1 g/dL (ref 6.0–8.5)

## 2018-04-20 ENCOUNTER — Ambulatory Visit (INDEPENDENT_AMBULATORY_CARE_PROVIDER_SITE_OTHER): Payer: Medicare HMO | Admitting: Family Medicine

## 2018-04-20 ENCOUNTER — Encounter: Payer: Self-pay | Admitting: Family Medicine

## 2018-04-20 VITALS — BP 138/72 | HR 92 | Temp 98.6°F | Ht 62.0 in | Wt 137.4 lb

## 2018-04-20 DIAGNOSIS — H9113 Presbycusis, bilateral: Secondary | ICD-10-CM

## 2018-04-20 DIAGNOSIS — J411 Mucopurulent chronic bronchitis: Secondary | ICD-10-CM

## 2018-04-20 DIAGNOSIS — I10 Essential (primary) hypertension: Secondary | ICD-10-CM

## 2018-04-20 DIAGNOSIS — Z87891 Personal history of nicotine dependence: Secondary | ICD-10-CM | POA: Diagnosis not present

## 2018-04-20 MED ORDER — AZITHROMYCIN 250 MG PO TABS: ORAL_TABLET | ORAL | 0 refills | Status: DC

## 2018-04-20 MED ORDER — LOSARTAN POTASSIUM 25 MG PO TABS: 25.0000 mg | ORAL_TABLET | Freq: Every day | ORAL | 1 refills | Status: DC

## 2018-04-20 MED ORDER — GABAPENTIN 300 MG PO CAPS: 300.0000 mg | ORAL_CAPSULE | Freq: Three times a day (TID) | ORAL | 0 refills | Status: DC

## 2018-04-20 MED ORDER — IPRATROPIUM-ALBUTEROL 0.5-2.5 (3) MG/3ML IN SOLN: 3.0000 mL | RESPIRATORY_TRACT | 5 refills | Status: DC | PRN

## 2018-04-20 NOTE — Progress Notes (Signed)
Subjective:  Patient ID: Catherine Harrell, female    DOB: 1945-12-11  Age: 72 y.o. MRN: [id number]  CC: Medical Management of Chronic Issues   HPI Catherine Harrell presents for the patient states that she is still a little sore from her fall last week for which she was seen by Dr. Nadine Counts.  But overall she is about back to normal.  She is having some trouble breathing in the heat of the day now.  However, she has not been using her DuoNeb except on days when she has a lot of trouble breathing and she has not used it in spite of statement to be otherwise regarding her breathing.  She does use the Trelegy every day.  She continues to use the losartan.  She denies any edema and excessive fatigue.  She reports cough productive of mucoid phlegm.  This is been going on for more than a week.  There is perhaps more shortness of breath than usual.   Follow-up of hypertension. Patient has no history of headache chest pain or shortness of breath or recent cough. Patient also denies symptoms of TIA such as numbness weakness lateralizing. Patient checks  blood pressure at home and has not had any elevated readings recently. Patient denies side effects from his medication. States taking it regularly.   Depression screen [facility] 2/9 04/20/2018 04/13/2018 01/18/2018  Decreased Interest 0 0 0  Down, Depressed, Hopeless 0 0 0  PHQ - 2 Score 0 0 0    History Catherine Harrell has a past medical history of COPD (chronic obstructive pulmonary disease) (HCC), Hypertension, Osteoporosis, and TMJ arthralgia.   She has no past surgical history on file.   Her family history includes COPD in her father and sister; Cancer in her daughter; Diabetes in her mother and sister; Healthy in her son and son; Heart attack in her sister; Heart disease in her brother, father, and sister; Hypertension in her mother; Kidney disease in her daughter.She reports that she quit smoking about 13 years ago. Her smoking use included cigarettes. She has  a 30.00 pack-year smoking history. She has never used smokeless tobacco. She reports that she does not drink alcohol or use drugs.    ROS Review of Systems  Constitutional: Negative.   HENT: Positive for hearing loss (Patient brings in a brochure from her insurance company with a hearing aid solution possible and an 800-number to call.). Negative for congestion.   Eyes: Negative for visual disturbance.  Respiratory: Positive for cough and shortness of breath.   Cardiovascular: Negative for chest pain.  Gastrointestinal: Negative for abdominal pain, constipation, diarrhea, nausea and vomiting.  Genitourinary: Negative for difficulty urinating.  Musculoskeletal: Negative for arthralgias and myalgias.  Neurological: Negative for headaches.  Psychiatric/Behavioral: Negative for sleep disturbance.    Objective:  BP 138/72   Pulse 92   Temp 98.6 F (37 C) (Oral)   Ht  (1.575 m)   Wt 137 lb 6 oz (62.3 kg)   SpO2 94%   BMI 25.13 kg/m   BP Readings from Last 3 Encounters:  04/20/18 138/72  04/13/18 138/82  01/18/18 122/74    Wt Readings from Last 3 Encounters:  04/20/18 137 lb 6 oz (62.3 kg)  04/13/18 137 lb (62.1 kg)  01/18/18 136 lb 4 oz (61.8 kg)     Physical Exam  Constitutional: She is oriented to person, place, and time. She appears well-developed and well-nourished. No distress.  HENT:  Head: Normocephalic and atraumatic.  Right Ear: External ear  normal.  Left Ear: External ear normal.  Patient is dramatically hard of hearing.  In order to be heard I have to use an elevated voice and speak directly into her ear.  Eyes: Pupils are equal, round, and reactive to light. Conjunctivae are normal.  Neck: Normal range of motion. Neck supple. No thyromegaly present.  Cardiovascular: Normal rate, regular rhythm and normal heart sounds.  No murmur heard. Pulmonary/Chest: Effort normal. No respiratory distress. She has wheezes (Noted at right upper lung field.). She has no  rales.  Abdominal: Soft. Bowel sounds are normal. She exhibits no distension. There is no tenderness.  Musculoskeletal: Normal range of motion.  Lymphadenopathy:    She has no cervical adenopathy.  Neurological: She is alert and oriented to person, place, and time.  Skin: Skin is warm and dry.  Psychiatric: She has a normal mood and affect. Her behavior is normal. Judgment and thought content normal.      Assessment & Plan:   Catherine Harrell was seen today for medical management of chronic issues.  Diagnoses and all orders for this visit:  Mucopurulent chronic bronchitis (HCC)  Former cigarette smoker -     CT CHEST LUNG CA SCREEN LOW DOSE W/O CM; Future  Presbycusis of both ears  Benign essential HTN  Other orders -     azithromycin (ZITHROMAX) 250 MG tablet; Take as directed -     losartan (COZAAR) 25 MG tablet; Take 1 tablet (25 mg total) by mouth daily. -     gabapentin (NEURONTIN) 300 MG capsule; Take 1 capsule (300 mg total) by mouth 3 (three) times daily. -     ipratropium-albuterol (DUONEB) 0.5-2.5 (3) MG/3ML SOLN; Take 3 mLs by nebulization every 4 (four) hours as needed.       I have changed Catherine Harrell's gabapentin. I am also having her start on azithromycin. Additionally, I am having her maintain her Fluticasone-Umeclidin-Vilant, ibandronate, predniSONE, losartan, and ipratropium-albuterol.  Allergies as of 04/20/2018   No Known Allergies     Medication List        Accurate as of 04/20/18 12:07 PM. Always use your most recent med list.          azithromycin 250 MG tablet Commonly known as:  ZITHROMAX Take as directed   Fluticasone-Umeclidin-Vilant 100-62.5-25 MCG/INH Aepb Commonly known as:  TRELEGY ELLIPTA Inhale 1 puff into the lungs daily.   gabapentin 300 MG capsule Commonly known as:  NEURONTIN Take 1 capsule (300 mg total) by mouth 3 (three) times daily.   ibandronate 150 MG tablet Commonly known as:  BONIVA Take 1 tablet (150 mg total)  by mouth every 30 (thirty) days. Take w/ a full glass of water, on an empty stomach.  Do not take anything else po or lie down for the next 30 min.   ipratropium-albuterol 0.5-2.5 (3) MG/3ML Soln Commonly known as:  DUONEB Take 3 mLs by nebulization every 4 (four) hours as needed.   losartan 25 MG tablet Commonly known as:  COZAAR Take 1 tablet (25 mg total) by mouth daily.   predniSONE 5 MG tablet Commonly known as:  DELTASONE TAKE 2 TABLETS BY MOUTH DAILY WITH BREAKFAST.      I also highly encouraged the patient to seek assistance with her hearing.  She should start by calling the 800-number on the insurance hearing aid program.  The should be able to set her up with a local provider that works with that insurance and hearing aid provider.  Follow-up: Return in  about 3 months (around 07/21/2018).  [name], M.D.

## 2018-04-20 NOTE — Patient Instructions (Signed)
Use Duoneb in the nebulizer 2-4 times everyday.

## 2018-05-17 ENCOUNTER — Other Ambulatory Visit: Payer: Self-pay | Admitting: Family Medicine

## 2018-05-21 ENCOUNTER — Other Ambulatory Visit: Payer: Self-pay | Admitting: Family Medicine

## 2018-06-14 ENCOUNTER — Other Ambulatory Visit: Payer: Self-pay | Admitting: Family Medicine

## 2018-06-17 ENCOUNTER — Other Ambulatory Visit: Payer: Self-pay | Admitting: Family Medicine

## 2018-06-17 NOTE — Telephone Encounter (Signed)
Last seen 04/20/18

## 2018-06-24 ENCOUNTER — Other Ambulatory Visit: Payer: Self-pay | Admitting: Family Medicine

## 2018-07-21 ENCOUNTER — Ambulatory Visit (INDEPENDENT_AMBULATORY_CARE_PROVIDER_SITE_OTHER): Payer: Medicare HMO | Admitting: Family Medicine

## 2018-07-21 ENCOUNTER — Encounter: Payer: Self-pay | Admitting: Family Medicine

## 2018-07-21 VITALS — BP 138/74 | HR 80 | Temp 97.8°F | Ht 62.0 in | Wt 138.2 lb

## 2018-07-21 DIAGNOSIS — R5383 Other fatigue: Secondary | ICD-10-CM

## 2018-07-21 DIAGNOSIS — Z1321 Encounter for screening for nutritional disorder: Secondary | ICD-10-CM | POA: Diagnosis not present

## 2018-07-21 DIAGNOSIS — I10 Essential (primary) hypertension: Secondary | ICD-10-CM | POA: Diagnosis not present

## 2018-07-21 DIAGNOSIS — Z13220 Encounter for screening for lipoid disorders: Secondary | ICD-10-CM

## 2018-07-21 DIAGNOSIS — J411 Mucopurulent chronic bronchitis: Secondary | ICD-10-CM

## 2018-07-21 MED ORDER — GABAPENTIN 300 MG PO CAPS: ORAL_CAPSULE | ORAL | 1 refills | Status: DC

## 2018-07-21 NOTE — Progress Notes (Signed)
Subjective:  Patient ID: Catherine Harrell, female    DOB: 06/15/1946  Age: 72 y.o. MRN: [id number]  CC: Medical Management of Chronic Issues   HPI Daritza Brees presents for follow-up of her COPD.  She coughs heavily and gets short of breath easily.  She has mucopurulent drainage chronically.  She also has a runny nose daily.  She is concerned that sinus may be causing some of her symptoms.  She is able to go about a mile daily but gets short of breath with more than usual light exertion.  She is using her Trelegy and her nebulizer as directed in addition to 10 mg daily of prednisone.    Follow-up of hypertension. Patient has no history of headache and chest pain . Patient also denies symptoms of TIA such as numbness weakness lateralizing. Patient checks  blood pressure at home and has not had any elevated readings recently. Patient denies side effects from her medication. States taking it regularly.  Depression screen [facility] 2/9 07/21/2018 04/20/2018 04/13/2018  Decreased Interest 0 0 0  Down, Depressed, Hopeless 0 0 0  PHQ - 2 Score 0 0 0    History Altha Harm has a past medical history of COPD (chronic obstructive pulmonary disease) ([place]), Hypertension, Osteoporosis, and TMJ arthralgia.   She has no past surgical history on file.   Her family history includes COPD in her father and sister; Cancer in her daughter; Diabetes in her mother and sister; Healthy in her son and son; Heart attack in her sister; Heart disease in her brother, father, and sister; Hypertension in her mother; Kidney disease in her daughter.She reports that she quit smoking about 13 years ago. Her smoking use included cigarettes. She has a 30.00 pack-year smoking history. She has never used smokeless tobacco. She reports that she does not drink alcohol or use drugs.    ROS Review of Systems  Constitutional: Positive for fatigue. Negative for activity change and appetite change.  HENT: Negative for congestion.   Eyes:  Negative for visual disturbance.  Respiratory: Positive for shortness of breath.   Cardiovascular: Negative for chest pain.  Gastrointestinal: Negative for abdominal pain, constipation, diarrhea, nausea and vomiting.  Genitourinary: Negative for difficulty urinating.  Musculoskeletal: Negative for arthralgias and myalgias.  Neurological: Negative for headaches.  Psychiatric/Behavioral: Negative for sleep disturbance.    Objective:  BP 138/74   Pulse 80   Temp 97.8 F (36.6 C) (Oral)   Ht _0  (1.575 m)   Wt 138 lb 4 oz (62.7 kg)   BMI 25.29 kg/m   BP Readings from Last 3 Encounters:  07/21/18 138/74  04/20/18 138/72  04/13/18 138/82    Wt Readings from Last 3 Encounters:  07/21/18 138 lb 4 oz (62.7 kg)  04/20/18 137 lb 6 oz (62.3 kg)  04/13/18 137 lb (62.1 kg)     Physical Exam  Constitutional: She is oriented to person, place, and time. She appears well-developed and well-nourished. No distress.  HENT:  Head: Normocephalic and atraumatic.  Right Ear: External ear normal.  Left Ear: External ear normal.  Nose: Nose normal.  Mouth/Throat: Oropharynx is clear and moist.  Eyes: Pupils are equal, round, and reactive to light. Conjunctivae and EOM are normal.  Neck: Normal range of motion. Neck supple. No thyromegaly present.  Cardiovascular: Normal rate, regular rhythm and normal heart sounds.  No murmur heard. Pulmonary/Chest: Effort normal. No respiratory distress. She has wheezes. She has no rales.  Breath sounds distant at bases   Abdominal:  Soft. Bowel sounds are normal. She exhibits no distension. There is no tenderness.  Lymphadenopathy:    She has no cervical adenopathy.  Neurological: She is alert and oriented to person, place, and time. She has normal reflexes.  Skin: Skin is warm and dry.  Psychiatric: She has a normal mood and affect. Her behavior is normal. Judgment and thought content normal.      Assessment & Plan:   Altha Harm was seen today for  medical management of chronic issues.  Diagnoses and all orders for this visit:  Mucopurulent chronic bronchitis ([place])  Benign essential HTN  Other fatigue -     CMP14+EGFR -     TSH  Encounter for vitamin deficiency screening -     VITAMIN D 25 Hydroxy (Vit-D Deficiency, Fractures)  Screening for cholesterol level -     Lipid panel  Other orders -     gabapentin (NEURONTIN) 300 MG capsule; TAKE 1 CAPSULE BY MOUTH THREE TIMES A DAY       I have discontinued [name] Eley's azithromycin. I am also having her maintain her Fluticasone-Umeclidin-Vilant, losartan, ipratropium-albuterol, ibandronate, predniSONE, and gabapentin.  Allergies as of 07/21/2018   No Known Allergies     Medication List        Accurate as of 07/21/18  2:35 PM. Always use your most recent med list.          Fluticasone-Umeclidin-Vilant 100-62.5-25 MCG/INH Aepb Inhale 1 puff into the lungs daily.   gabapentin 300 MG capsule Commonly known as:  NEURONTIN TAKE 1 CAPSULE BY MOUTH THREE TIMES A DAY   ibandronate 150 MG tablet Commonly known as:  BONIVA 1 TABLET EVERY 30 DAYS W/ FULL GLASS OF WATER ON AN EMPTY STOMACH.DO NOT EAT/DRINK/LIE DOWN 30 MINS   ipratropium-albuterol 0.5-2.5 (3) MG/3ML Soln Commonly known as:  DUONEB Take 3 mLs by nebulization every 4 (four) hours as needed.   losartan 25 MG tablet Commonly known as:  COZAAR Take 1 tablet (25 mg total) by mouth daily.   predniSONE 5 MG tablet Commonly known as:  DELTASONE TAKE 2 TABLETS BY MOUTH DAILY WITH BREAKFAST.        Follow-up: Return in about 3 months (around 10/21/2018).  Claretta Fraise, M.D.

## 2018-07-22 LAB — LIPID PANEL
CHOLESTEROL TOTAL: 196 mg/dL (ref 100–199)
Chol/HDL Ratio: 3.5 ratio (ref 0.0–4.4)
HDL: 56 mg/dL (ref >39)
LDL Calculated: 121 mg/dL — ABNORMAL HIGH (ref 0–99)
Triglycerides: 94 mg/dL (ref 0–149)
VLDL Cholesterol Cal: 19 mg/dL (ref 5–40)

## 2018-07-22 LAB — CMP14+EGFR
A/G RATIO: 1.4 (ref 1.2–2.2)
ALK PHOS: 61 IU/L (ref 39–117)
ALT: 11 IU/L (ref 0–32)
AST: 13 IU/L (ref 0–40)
Albumin: 4.1 g/dL (ref 3.5–4.8)
BILIRUBIN TOTAL: 0.3 mg/dL (ref 0.0–1.2)
BUN/Creatinine Ratio: 8 — ABNORMAL LOW (ref 12–28)
BUN: 8 mg/dL (ref 8–27)
CHLORIDE: 101 mmol/L (ref 96–106)
CO2: 27 mmol/L (ref 20–29)
Calcium: 10.8 mg/dL — ABNORMAL HIGH (ref 8.7–10.3)
Creatinine, Ser: 0.96 mg/dL (ref 0.57–1.00)
GFR calc Af Amer: 68 mL/min/{1.73_m2} (ref >59)
GFR calc non Af Amer: 59 mL/min/{1.73_m2} — ABNORMAL LOW (ref >59)
Globulin, Total: 3 g/dL (ref 1.5–4.5)
Glucose: 97 mg/dL (ref 65–99)
POTASSIUM: 5.3 mmol/L — AB (ref 3.5–5.2)
Sodium: 143 mmol/L (ref 134–144)
Total Protein: 7.1 g/dL (ref 6.0–8.5)

## 2018-07-22 LAB — TSH: TSH: 2.52 u[IU]/mL (ref 0.450–4.500)

## 2018-07-22 LAB — VITAMIN D 25 HYDROXY (VIT D DEFICIENCY, FRACTURES): Vit D, 25-Hydroxy: 35.3 ng/mL (ref 30.0–100.0)

## 2018-07-23 ENCOUNTER — Telehealth: Payer: Self-pay | Admitting: Family Medicine

## 2018-07-23 NOTE — Telephone Encounter (Signed)
Aware of results and recommendations  

## 2018-08-09 ENCOUNTER — Other Ambulatory Visit: Payer: Self-pay | Admitting: Family Medicine

## 2018-08-13 ENCOUNTER — Telehealth: Payer: Self-pay | Admitting: Family Medicine

## 2018-08-13 NOTE — Telephone Encounter (Signed)
Can you find out what is covered? Thanks, WS

## 2018-08-19 NOTE — Telephone Encounter (Signed)
Pt will have to get preferred from her insurance  I don't have those list

## 2018-08-20 ENCOUNTER — Other Ambulatory Visit: Payer: Self-pay | Admitting: Family Medicine

## 2018-08-20 ENCOUNTER — Ambulatory Visit (INDEPENDENT_AMBULATORY_CARE_PROVIDER_SITE_OTHER): Payer: Medicare HMO

## 2018-08-20 DIAGNOSIS — Z23 Encounter for immunization: Secondary | ICD-10-CM

## 2018-08-20 MED ORDER — BUDESONIDE-FORMOTEROL FUMARATE 160-4.5 MCG/ACT IN AERO: 2.0000 | INHALATION_SPRAY | Freq: Two times a day (BID) | RESPIRATORY_TRACT | 5 refills | Status: DC

## 2018-09-08 ENCOUNTER — Other Ambulatory Visit: Payer: Self-pay | Admitting: Family Medicine

## 2018-10-03 ENCOUNTER — Other Ambulatory Visit: Payer: Self-pay | Admitting: Family Medicine

## 2018-10-15 ENCOUNTER — Other Ambulatory Visit: Payer: Self-pay | Admitting: Family Medicine

## 2018-10-18 NOTE — Telephone Encounter (Signed)
Last seen 07/21/18

## 2018-10-26 ENCOUNTER — Other Ambulatory Visit: Payer: Self-pay | Admitting: *Deleted

## 2018-10-26 ENCOUNTER — Ambulatory Visit: Payer: Medicare HMO | Admitting: Family Medicine

## 2018-10-26 MED ORDER — PREDNISONE 5 MG PO TABS: ORAL_TABLET | ORAL | 0 refills | Status: DC

## 2018-11-03 ENCOUNTER — Ambulatory Visit (INDEPENDENT_AMBULATORY_CARE_PROVIDER_SITE_OTHER): Payer: Medicare HMO | Admitting: Family Medicine

## 2018-11-03 ENCOUNTER — Encounter: Payer: Self-pay | Admitting: Family Medicine

## 2018-11-03 VITALS — BP 149/75 | HR 106 | Temp 97.4°F | Ht 62.0 in | Wt 138.0 lb

## 2018-11-03 DIAGNOSIS — I10 Essential (primary) hypertension: Secondary | ICD-10-CM

## 2018-11-03 DIAGNOSIS — M810 Age-related osteoporosis without current pathological fracture: Secondary | ICD-10-CM | POA: Diagnosis not present

## 2018-11-03 DIAGNOSIS — M26629 Arthralgia of temporomandibular joint, unspecified side: Secondary | ICD-10-CM | POA: Diagnosis not present

## 2018-11-03 DIAGNOSIS — J411 Mucopurulent chronic bronchitis: Secondary | ICD-10-CM | POA: Diagnosis not present

## 2018-11-03 NOTE — Progress Notes (Signed)
Subjective:  Patient ID: Catherine Harrell, female    DOB: [birth year]  Age: 72 y.o. MRN: [id number]  CC: Medical Management of Chronic Issues   HPI [name] presents for follow-up of hypertension. Patient has no history of headache chest pain or shortness of breath or recent cough. Patient also denies symptoms of TIA such as numbness weakness lateralizing. Patient checks  blood pressure at home and has not had any elevated readings recently. Patient denies side effects from his medication. States taking it regularly. Follow-up of her COPD.  She says that she could not afford the Symbicort and it made her mouth blister anyway.  She says she is doing okay and declines to have a substitute at this time.  She continues to use her nebulizer and the prednisone.  Patient also suffers from chronic TMJ syndrome.  It affects the left side of her face.  It is stable and tolerable as long as she takes her gabapentin.  Last flare of the pain was remote.  She is able to chew without problems.  She denies any side effects of her gabapentin.  Patient also continues to take medication for osteoporosis.  She is tolerating that well without any concerns or complaints.  She denies any signs of fracture including back pain joint pain or other bone pain or deformity.  Depression screen [facility] 2/9 11/03/2018 07/21/2018 04/20/2018  Decreased Interest 0 0 0  Down, Depressed, Hopeless 0 0 0  PHQ - 2 Score 0 0 0    History Catherine Harrell has a past medical history of COPD (chronic obstructive pulmonary disease) ([place]), Hypertension, Osteoporosis, and TMJ arthralgia.   She has no past surgical history on file.   Her family history includes COPD in her father and sister; Cancer in her daughter; Diabetes in her mother and sister; Healthy in her son and son; Heart attack in her sister; Heart disease in her brother, father, and sister; Hypertension in her mother; Kidney disease in her daughter.She reports that  she quit smoking about 13 years ago. Her smoking use included cigarettes. She has a 30.00 pack-year smoking history. She has never used smokeless tobacco. She reports that she does not drink alcohol or use drugs.    ROS Review of Systems  Constitutional: Negative.   HENT: Negative for congestion.   Eyes: Negative for visual disturbance.  Respiratory: Positive for cough and shortness of breath (Mostly when she arises in the mornings.  Relief with her nebulizer).   Cardiovascular: Negative for chest pain.  Gastrointestinal: Negative for abdominal pain, constipation, diarrhea, nausea and vomiting.  Genitourinary: Negative for difficulty urinating.  Musculoskeletal: Negative for arthralgias and myalgias.  Neurological: Negative for headaches.  Psychiatric/Behavioral: Negative for sleep disturbance.    Objective:  BP (!) 149/75   Pulse (!) 106   Temp (!) 97.4 F (36.3 C) (Oral)   Ht _0  (1.575 m)   Wt 138 lb (62.6 kg)   SpO2 94%   BMI 25.24 kg/m   BP Readings from Last 3 Encounters:  11/03/18 (!) 149/75  07/21/18 138/74  04/20/18 138/72    Wt Readings from Last 3 Encounters:  11/03/18 138 lb (62.6 kg)  07/21/18 138 lb 4 oz (62.7 kg)  04/20/18 137 lb 6 oz (62.3 kg)     Physical Exam  Constitutional: She is oriented to person, place, and time. She appears well-developed and well-nourished. No distress.  HENT:  Head: Normocephalic and atraumatic.  Right Ear: External ear normal.  Left Ear: External  ear normal.  Nose: Nose normal.  Mouth/Throat: Oropharynx is clear and moist.  Eyes: Pupils are equal, round, and reactive to light. Conjunctivae and EOM are normal.  Neck: Normal range of motion. Neck supple. No thyromegaly present.  Cardiovascular: Normal rate, regular rhythm and normal heart sounds.  No murmur heard. Pulmonary/Chest: Effort normal and breath sounds normal. No respiratory distress. She has no wheezes. She has no rales.  Abdominal: Soft. Bowel sounds are  normal. She exhibits no distension. There is no tenderness.  Lymphadenopathy:    She has no cervical adenopathy.  Neurological: She is alert and oriented to person, place, and time. She has normal reflexes.  Skin: Skin is warm and dry.  Psychiatric: She has a normal mood and affect. Her behavior is normal. Judgment and thought content normal.      Assessment & Plan:   Blessing was seen today for medical management of chronic issues.  Diagnoses and all orders for this visit:  Mucopurulent chronic bronchitis ([place])  Essential hypertension -     CMP14+EGFR  Age-related osteoporosis without current pathological fracture  TMJ syndrome       I have discontinued Lataya C. Mittelstadt's budesonide-formoterol. I am also having her maintain her losartan, ipratropium-albuterol, ibandronate, gabapentin, and predniSONE.  Allergies as of 11/03/2018   No Known Allergies     Medication List        Accurate as of 11/03/18  2:40 PM. Always use your most recent med list.          gabapentin 300 MG capsule Commonly known as:  NEURONTIN TAKE 1 CAPSULE BY MOUTH THREE TIMES A DAY   ibandronate 150 MG tablet Commonly known as:  BONIVA 1 TABLET EVERY 30 DAYS W/ FULL GLASS OF WATER ON AN EMPTY STOMACH.DO NOT EAT/DRINK/LIE DOWN 30 MINS   ipratropium-albuterol 0.5-2.5 (3) MG/3ML Soln Commonly known as:  DUONEB Take 3 mLs by nebulization every 4 (four) hours as needed.   losartan 25 MG tablet Commonly known as:  COZAAR Take 1 tablet (25 mg total) by mouth daily.   predniSONE 5 MG tablet Commonly known as:  DELTASONE TAKE 2 TABLETS BY MOUTH DAILY WITH BREAKFAST.        Follow-up: Return in about 6 months (around 05/05/2019).  Claretta Fraise, M.D.

## 2018-11-04 LAB — CMP14+EGFR
A/G RATIO: 1.3 (ref 1.2–2.2)
ALK PHOS: 60 IU/L (ref 39–117)
ALT: 11 IU/L (ref 0–32)
AST: 12 IU/L (ref 0–40)
Albumin: 4 g/dL (ref 3.5–4.8)
BUN / CREAT RATIO: 12 (ref 12–28)
BUN: 11 mg/dL (ref 8–27)
CALCIUM: 10.1 mg/dL (ref 8.7–10.3)
CHLORIDE: 98 mmol/L (ref 96–106)
CO2: 25 mmol/L (ref 20–29)
CREATININE: 0.94 mg/dL (ref 0.57–1.00)
GFR calc Af Amer: 70 mL/min/{1.73_m2} (ref >59)
GFR calc non Af Amer: 60 mL/min/{1.73_m2} (ref >59)
Globulin, Total: 3 g/dL (ref 1.5–4.5)
Glucose: 111 mg/dL — ABNORMAL HIGH (ref 65–99)
Potassium: 5.2 mmol/L (ref 3.5–5.2)
Sodium: 139 mmol/L (ref 134–144)
Total Protein: 7 g/dL (ref 6.0–8.5)

## 2018-11-05 NOTE — Progress Notes (Signed)
Hello Emelin,  Your lab result is normal.Some minor variations that are not significant are commonly marked abnormal, but do not represent any medical problem for you.  Best regards, Mechele ClaudeWarren Eathan Groman, M.D.

## 2018-11-27 ENCOUNTER — Other Ambulatory Visit: Payer: Self-pay | Admitting: Family Medicine

## 2018-12-03 ENCOUNTER — Other Ambulatory Visit: Payer: Self-pay | Admitting: Family Medicine

## 2018-12-30 ENCOUNTER — Other Ambulatory Visit: Payer: Self-pay | Admitting: Family Medicine

## 2019-01-17 ENCOUNTER — Telehealth: Payer: Self-pay | Admitting: Family Medicine

## 2019-01-21 ENCOUNTER — Other Ambulatory Visit: Payer: Self-pay | Admitting: Family Medicine

## 2019-01-21 MED ORDER — PREDNISONE 5 MG PO TABS: 10.0000 mg | ORAL_TABLET | Freq: Every day | ORAL | 5 refills | Status: DC

## 2019-01-21 MED ORDER — GABAPENTIN 300 MG PO CAPS: ORAL_CAPSULE | ORAL | 1 refills | Status: DC

## 2019-01-21 NOTE — Telephone Encounter (Signed)
Please clarify gabapentin sig

## 2019-01-21 NOTE — Telephone Encounter (Signed)
What is the name of the medication? Gabapentin 300 Takes one a day instead of three a day. Prednisone 5 mg  Have you contacted your pharmacy to request a refill? Yes  Which pharmacy would you [facility] sent to? [facility] [place]   Patient notified that their request is being sent to the clinical staff for review and that they should receive a call once it is complete. If they do not receive a call within 24 hours they can check with their pharmacy or our office.

## 2019-01-24 ENCOUNTER — Other Ambulatory Visit: Payer: Self-pay | Admitting: Family Medicine

## 2019-01-24 ENCOUNTER — Telehealth: Payer: Self-pay | Admitting: *Deleted

## 2019-01-24 MED ORDER — GABAPENTIN 300 MG PO CAPS: 300.0000 mg | ORAL_CAPSULE | Freq: Every day | ORAL | 1 refills | Status: DC

## 2019-01-24 NOTE — Telephone Encounter (Signed)
I sent in the scrip with the requested change.Please let her know. Thanks, WS

## 2019-01-24 NOTE — Telephone Encounter (Signed)
Fax from [facility] [place] Medication Clarification Original Gabapentin Rx 300 mg 1 TID  Patient requests new Rx Gabapentin 300 one a day instead of three a day Please advise

## 2019-01-24 NOTE — Telephone Encounter (Signed)
done

## 2019-02-02 ENCOUNTER — Ambulatory Visit (INDEPENDENT_AMBULATORY_CARE_PROVIDER_SITE_OTHER): Payer: Medicare HMO

## 2019-02-02 ENCOUNTER — Other Ambulatory Visit: Payer: Self-pay

## 2019-02-02 VITALS — BP 150/81 | HR 87 | Temp 97.3°F | Ht 62.0 in | Wt 140.0 lb

## 2019-02-02 DIAGNOSIS — Z0000 Encounter for general adult medical examination without abnormal findings: Secondary | ICD-10-CM

## 2019-02-02 NOTE — Patient Instructions (Signed)
  Ms. Cooksey , Thank you for taking time to come for your Medicare Wellness Visit. I appreciate your ongoing commitment to your health goals. Please review the following plan we discussed and let me know if I can assist you in the future.   These are the goals we discussed: Goals    . Exercise 3x per week (30 min per time)     Perform chair exercises daily walk for 5 to 10 minute 3 times a week as tolerated.     . Have 3 meals a day       This is a list of the screening recommended for you and due dates:  Health Maintenance  Topic Date Due  .  Hepatitis C: One time screening is recommended by Center for Disease Control  (CDC) for  adults born from [birth year] through 1966.   1946/04/29  . Stool Blood Test  12/01/1994  . Colon Cancer Screening  12/01/1994  . Mammogram  08/18/2018  . Tetanus Vaccine  10/07/2020  . Flu Shot  Completed  . DEXA scan (bone density measurement)  Completed  . Pneumonia vaccines  Completed

## 2019-02-02 NOTE — Progress Notes (Signed)
Subjective:   Catherine Harrell is a 73 y.o. female who presents for Medicare Annual (Subsequent) preventive examination. She currently resides in [place] with her husband of 58 years. They have four children together that all live nearby. 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 She presents today with SOB but she states that this is a regular problem for her. Her sats are 94%. She states that she was using a Trilogy inhaler but due to cost she can't afford any more. She has been using her nebulizer at home prn SOB and that has helped some. She is also in real need of hearing aids. She cannot afford these as well. I am referring her to CCM to see if she can get help with these. She and her husband do not have an advanced directive but do have something in place for their home and land should something happen. She is due for an eye exam and states that she feels she may have cataracts but has not had them checked. I also think this may be due to cost.   Review of Systems:   Cardiac Risk Factors include: advanced age (>69men, >8 women);smoking/ tobacco exposure     Objective:     Vitals: BP (!) 150/81   Pulse 87   Temp (!) 97.3 F (36.3 C) (Oral)   Ht  (1.575 m)   Wt 140 lb (63.5 kg)   SpO2 94%   BMI 25.61 kg/m   Body mass index is 25.61 kg/m.  Advanced Directives 02/02/2019 05/13/2017  Does Patient Have a Medical Advance Directive? No No  Would patient like information on creating a medical advance directive? No - Patient declined Yes (MAU/Ambulatory/Procedural Areas - Information given)    Tobacco Social History   Tobacco Use  Smoking Status Former Smoker  . Packs/day: 1.00  . Years: 30.00  . Pack years: 30.00  . Types: Cigarettes  . Last attempt to quit: 01/16/2005  . Years since quitting: 14.0  Smokeless Tobacco Never Used     Counseling given: Not Answered Patient is a former smoker and has COPD  Clinical Intake:  Pre-visit  preparation completed: No  Pain : No/denies pain     BMI - recorded: 25.24 Nutritional Status: BMI 25 -29 Overweight Nutritional Risks: None Diabetes: No  How often do you need to have someone help you when you read instructions, pamphlets, or other written materials from your doctor or pharmacy?: 1 - Never What is the last grade level you completed in school?: 9th grade  Interpreter Needed?: No  Information entered by :: Mckinley Jewel LPN  Past Medical History:  Diagnosis Date  . COPD (chronic obstructive pulmonary disease) ([facility])   . Hypertension   . Osteoporosis   . TMJ arthralgia    left   History reviewed. No pertinent surgical history. Family History  Problem Relation Age of Onset  . Heart disease Father   . COPD Father   . Heart disease Sister   . Heart attack Sister   . Diabetes Sister   . COPD Sister   . Heart disease Brother   . Cancer Daughter        breast  . Hypertension Mother   . Diabetes Mother   . Healthy Son   . Cancer Son        Prostate   . Kidney disease Daughter   . Healthy Son    Social History   Socioeconomic History  .  Marital status: Married    Spouse name: Not on file  . Number of children: 4  . Years of education: Not on file  . Highest education level: 9th grade  Occupational History  . Occupation: Retired  Engineer, production  . Financial resource strain: Not hard at all  . Food insecurity:    Worry: Never true    Inability: Never true  . Transportation needs:    Medical: No    Non-medical: No  Tobacco Use  . Smoking status: Former Smoker    Packs/day: 1.00    Years: 30.00    Pack years: 30.00    Types: Cigarettes    Last attempt to quit: 01/16/2005    Years since quitting: 14.0  . Smokeless tobacco: Never Used  Substance and Sexual Activity  . Alcohol use: No  . Drug use: No  . Sexual activity: Not Currently  Lifestyle  . Physical activity:    Days per week: 0 days    Minutes per session: 0 min  . Stress: Not at all   Relationships  . Social connections:    Talks on phone: More than three times a week    Gets together: Twice a week    Attends religious service: Not on file    Active member of club or organization: No    Attends meetings of clubs or organizations: Never    Relationship status: Married  Other Topics Concern  . Not on file  Social History Narrative  . Not on file    Outpatient Encounter Medications as of 02/02/2019  Medication Sig  . gabapentin (NEURONTIN) 300 MG capsule Take 1 capsule (300 mg total) by mouth at bedtime.  . ibandronate (BONIVA) 150 MG tablet 1 TABLET EVERY 30 DAYS W/ FULL GLASS OF WATER ON AN EMPTY STOMACH.DO NOT EAT/DRINK/LIE DOWN 30 MINS  . ipratropium-albuterol (DUONEB) 0.5-2.5 (3) MG/3ML SOLN Take 3 mLs by nebulization every 4 (four) hours as needed.  Marland Kitchen losartan (COZAAR) 25 MG tablet TAKE 1 TABLET BY MOUTH EVERY DAY  . predniSONE (DELTASONE) 5 MG tablet Take 2 tablets (10 mg total) by mouth daily with breakfast.   No facility-administered encounter medications on file as of 02/02/2019.     Activities of Daily Living In your present state of health, do you have any difficulty performing the following activities: 02/02/2019  Hearing? Y  Comment Patient needs hearing aids badly but can't afford  Vision? Y  Comment Wears reading glasses. Needs to have an eye exam. Thinks she may have cataracts  Difficulty concentrating or making decisions? N  Walking or climbing stairs? N  Dressing or bathing? N  Doing errands, shopping? N  Preparing Food and eating ? N  Using the Toilet? N  In the past six months, have you accidently leaked urine? Y  Do you have problems with loss of bowel control? N  Managing your Medications? N  Managing your Finances? N  Housekeeping or managing your Housekeeping? N  Some recent data might be hidden    Patient Care Team: [name], MD as PCP - General (Family Medicine)    Assessment:   This is a routine wellness examination  for Catherine Harrell.  Exercise Activities and Dietary recommendations Current Exercise Habits: The patient does not participate in regular exercise at present, Exercise limited by: respiratory conditions(s)  Goals    . Exercise 3x per week (30 min per time)     Perform chair exercises daily walk for 5 to 10 minute 3 times  a week as tolerated.     . Have 3 meals a day      Due to patients SOB she is unable to do much exercise. She tries to get out of the house as much as possible and work in her yard  Fall Risk Fall Risk  02/02/2019 11/03/2018 07/21/2018 04/20/2018 04/13/2018  Falls in the past year? 0 0 No No No  Number falls in past yr: - - - - -   Is the patient's home free of loose throw rugs in walkways, pet beds, electrical cords, etc?   yes      Grab bars in the bathroom? no      Handrails on the stairs?   yes      Adequate lighting?   yes    Depression Screen PHQ 2/9 Scores 02/02/2019 11/03/2018 07/21/2018 04/20/2018  PHQ - 2 Score 0 0 0 0     Cognitive Function MMSE - Mini Mental State Exam 02/02/2019 05/13/2017  Orientation to time 5 5  Orientation to Place 5 5  Registration 3 3  Attention/ Calculation 5 3  Recall 3 2  Language- name 2 objects 2 2  Language- repeat 1 1  Language- follow 3 step command 3 3  Language- read & follow direction 1 1  Write a sentence 1 1  Copy design 1 1  Total score 30 27    Patient had no problem completing MMSE other than difficulty hearing questions    Immunization History  Administered Date(s) Administered  . Influenza Split 09/15/2013  . Influenza, High Dose Seasonal PF 08/30/2015, 08/18/2016, 09/16/2017, 08/20/2018  . Pneumococcal Conjugate-13 08/30/2014  . Pneumococcal Polysaccharide-23 08/07/2013  . Pneumococcal-Unspecified 09/15/2013  . Tdap 10/07/2010    Qualifies for Shingles Vaccine? Patient declines injection at this time  Screening Tests Health Maintenance  Topic Date Due  . Hepatitis C Screening  09-04-1946  . COLON  CANCER SCREENING ANNUAL FOBT  12/01/1994  . COLONOSCOPY  12/01/1994  . MAMMOGRAM  08/18/2018  . TETANUS/TDAP  10/07/2020  . INFLUENZA VACCINE  Completed  . DEXA SCAN  Completed  . PNA vac Low Risk Adult  Completed    Cancer Screenings: Lung: Low Dose CT Chest recommended if Age 65-80 years, 30 pack-year currently smoking OR have quit w/in 15years. Patient does qualify.  Order has already been placed in chart Breast:  Up to date on Mammogram? No   Up to date of Bone Density/Dexa? Yes Colorectal: Patient states she had done years ago.   Additional Screenings:  Hepatitis C Screening: Patient needs to have this drawn with next bloodwork     Plan:     I have personally reviewed and noted the following in the patient's chart:   . Medical and social history . Use of alcohol, tobacco or illicit drugs  . Current medications and supplements . Functional ability and status . Nutritional status . Physical activity . Advanced directives . List of other physicians . Hospitalizations, surgeries, and ER visits in previous 12 months . Vitals . Screenings to include cognitive, depression, and falls . Referrals and appointments  In addition, I have reviewed and discussed with patient certain preventive protocols, quality metrics, and best practice recommendations. A written personalized care plan for preventive services as well as general preventive health recommendations were provided to patient.     Cleda Daub, LPN  2/33/0076

## 2019-02-14 ENCOUNTER — Ambulatory Visit: Payer: Self-pay | Admitting: Licensed Clinical Social Worker

## 2019-02-14 DIAGNOSIS — R5383 Other fatigue: Secondary | ICD-10-CM

## 2019-02-14 DIAGNOSIS — M25551 Pain in right hip: Secondary | ICD-10-CM

## 2019-02-14 DIAGNOSIS — R0602 Shortness of breath: Secondary | ICD-10-CM

## 2019-02-14 DIAGNOSIS — J411 Mucopurulent chronic bronchitis: Secondary | ICD-10-CM

## 2019-02-14 DIAGNOSIS — I10 Essential (primary) hypertension: Secondary | ICD-10-CM

## 2019-02-14 NOTE — Patient Instructions (Signed)
Licensed Clinical Social Worker Visit Information  Materials provided: No  Ms. Mcevoy/spouse were given information about Chronic Care Management services today including:  1. CCM service includes personalized support from designated clinical staff supervised by her physician, including individualized plan of care and coordination with other care providers 2. 24/7 contact phone numbers for assistance for urgent and routine care needs. 3. Service will only be billed when office clinical staff spend 20 minutes or more in a month to coordinate care. 4. Only one practitioner may furnish and bill the service in a calendar month. 5. The patient may stop CCM services at any time (effective at the end of the month) by phone call to the office staff. 6. The patient will be responsible for cost sharing (co-pay) of up to 20% of the service fee (after annual deductible is met).  Patient did not agree to services and wishes to consider information provided before deciding about enrollment in CCM services.    Follow Up Plan:  LCSW to call client/spouse in next 2 weeks to talk further with client/spouse about CCM program services  The patient verbalized understanding of instructions provided today and declined a print copy of patient instruction materials.   [name].Kristopher Delk MSW, LCSW Licensed Clinical Social Worker [facility] Care Management [phone]

## 2019-02-14 NOTE — Chronic Care Management (AMB) (Signed)
  Care Management Note   Catherine Harrell is a 73 y.o. year old female who is a primary care patient of Stacks, Cletus Gash, MD. The CM team was consulted for assistance with chronic disease management and care coordination.   I reached out to [place] by phone today.   Ms. [name] were  given information about Chronic Care Management services today including:  1. CCM service includes personalized support from designated clinical staff supervised by her physician, including individualized plan of care and coordination with other care providers 2. 24/7 contact phone numbers for assistance for urgent and routine care needs. 3. Service will only be billed when office clinical staff spend 20 minutes or more in a month to coordinate care. 4. Only one practitioner may furnish and bill the service in a calendar month. 5. The patient may stop CCM services at any time (effective at the end of the month) by phone call to the office staff. 6. The patient will be responsible for cost sharing (co-pay) of up to 20% of the service fee (after annual deductible is met). Patient did not agree to services and wishes to consider information provided before deciding about enrollment in CCM services.    Review of patient status, including review of consultants reports, relevant laboratory and other test results, and collaboration with appropriate care team members and the patient's provider was performed as part of comprehensive patient evaluation and provision of chronic care management services.   Social Determinants of Health: Client has hearing challenges and some vision challenges. Financial challenges related to paying for medical services received  Follow Up Plan: LCSW to call client/spouse of client in next 2 weeks to talk further with client/spouse about CCM program services  [name].Cadey Bazile MSW, LCSW Licensed Clinical Social Worker [facility] [phone]

## 2019-02-25 ENCOUNTER — Ambulatory Visit: Payer: Self-pay | Admitting: Licensed Clinical Social Worker

## 2019-02-25 DIAGNOSIS — J411 Mucopurulent chronic bronchitis: Secondary | ICD-10-CM

## 2019-02-25 DIAGNOSIS — I10 Essential (primary) hypertension: Secondary | ICD-10-CM

## 2019-02-25 DIAGNOSIS — R0602 Shortness of breath: Secondary | ICD-10-CM

## 2019-02-25 DIAGNOSIS — R5383 Other fatigue: Secondary | ICD-10-CM

## 2019-02-25 DIAGNOSIS — M25551 Pain in right hip: Secondary | ICD-10-CM

## 2019-02-25 NOTE — Chronic Care Management (AMB) (Signed)
  Care Management Note   Catherine Harrell is a 73 y.o. year old female who is a primary care patient of Stacks, Broadus John, MD. The CM team was consulted for assistance with chronic disease management and care coordination.   I reached out to [employer] by phone several times on 02/25/2019 but was unable to speak with her via phone. LCSW left phone message for client requesting client return call to LCSW at [phone] to discuss CCM program services.   Review of patient status, including review of consultants reports, relevant laboratory and other test results, and collaboration with appropriate care team members and the patient's provider was performed as part of comprehensive patient evaluation and provision of chronic care management services.   Follow Up Plan: LCSW to call client in next 2 weeks to talk further with client about CCM program services.  Kelton Pillar.Katelinn Justice MSW, LCSW Licensed Clinical Social Worker [facility] [phone]

## 2019-02-25 NOTE — Patient Instructions (Signed)
Licensed Clinical Social Worker Visit Information  Goals we discussed today: None   Materials Provided: No  Follow Up Plan: LCSW will call client in next 2 weeks to talk further with client about CCM program services provision  LCSW was not able to speak via phone with client on 02/25/2019 and thus, patient was not able to verbalize understanding of instructions provided today and could not accept or decline a print copy of patient instruction materials.   Kelton Pillar.Meagen Limones MSW, LCSW Licensed Clinical Social Worker [facility] [phone]

## 2019-03-11 ENCOUNTER — Telehealth: Payer: Self-pay

## 2019-03-14 ENCOUNTER — Ambulatory Visit: Payer: Self-pay | Admitting: Licensed Clinical Social Worker

## 2019-03-14 DIAGNOSIS — J441 Chronic obstructive pulmonary disease with (acute) exacerbation: Secondary | ICD-10-CM

## 2019-03-14 DIAGNOSIS — R5383 Other fatigue: Secondary | ICD-10-CM

## 2019-03-14 DIAGNOSIS — R0602 Shortness of breath: Secondary | ICD-10-CM

## 2019-03-14 DIAGNOSIS — I10 Essential (primary) hypertension: Secondary | ICD-10-CM

## 2019-03-14 DIAGNOSIS — M25551 Pain in right hip: Secondary | ICD-10-CM

## 2019-03-14 NOTE — Chronic Care Management (AMB) (Signed)
  Care Management Note   Catherine Harrell is a 73 y.o. year old female who is a primary care patient of Stacks, Cletus Gash, MD. The CM team was consulted for assistance with chronic disease management and care coordination.   I reached out to [employer] [place]/spouse of client by phone today. LCSW called client home phone number several times on 03/14/2019 with no response.  LCSW was not able to speak with client or her spouse via phone on 03/14/2019.  LCSW did leave phone message for client/spouse on 03/14/2019 requesting that client/spouse please return call to LCSW at [phone] to talk more about CCM program services. Dr. Livia Snellen had sent referral for client for CCM services especially related to the hearing needs of client and related to the vision needs of client., LCSW is awaiting return call from client/spouse.    Ms. Newstrom were previously given information about Chronic Care Management services including:  1. CCM service includes personalized support from designated clinical staff supervised by her physician, including individualized plan of care and coordination with other care providers 2. 24/7 contact phone numbers for assistance for urgent and routine care needs. 3. Service will only be billed when office clinical staff spend 20 minutes or more in a month to coordinate care. 4. Only one practitioner may furnish and bill the service in a calendar month. 5. The patient may stop CCM services at any time (effective at the end of the month) by phone call to the office staff. 6. The patient will be responsible for cost sharing (co-pay) of up to 20% of the service fee (after annual deductible is met). Patient/spouse previously did not agree to services and wished to consider information provided before deciding about enrollment in CCM services.    Review of patient status, including review of consultants reports, relevant laboratory and other test results, and collaboration  with appropriate care team members and the patient's provider was performed as part of comprehensive patient evaluation and provision of chronic care management services.   Follow Up Plan: LCSW to call client or spouse of client in next 2 weeks to talk with client or spouse of client about CCM program services  [name].Thiago Ragsdale MSW, LCSW Licensed Clinical Social Worker [facility] Care [facility] [phone]

## 2019-03-14 NOTE — Patient Instructions (Addendum)
Licensed Clinical Social Worker Visit Information  Materials Provided: No  I reached out to [employer]/spouse of client by phone today. LCSW called client home phone number several times on 03/14/2019 with no response.  LCSW was not able to speak with client or her spouse via phone on 03/14/2019.  LCSW did leave phone message for client/spouse on 03/14/2019 requesting that client/spouse please return call to LCSW at [phone] to talk more about CCM program services. Dr. Livia Snellen had sent referral for client for CCM services especially related to the hearing needs of client and related to the vision needs of client., LCSW is awaiting return call from client/spouse.    Ms. Dobosz were previously given information about Chronic Care Management services including:  1. CCM service includes personalized support from designated clinical staff supervised by her physician, including individualized plan of care and coordination with other care providers 2. 24/7 contact phone numbers for assistance for urgent and routine care needs. 3. Service will only be billed when office clinical staff spend 20 minutes or more in a month to coordinate care. 4. Only one practitioner may furnish and bill the service in a calendar month. 5. The patient may stop CCM services at any time (effective at the end of the month) by phone call to the office staff. 6. The patient will be responsible for cost sharing (co-pay) of up to 20% of the service fee (after annual deductible is met). Patient/spouse previously did not agree to services and wished to consider information provided before deciding about enrollment in CCM services.    Follow Up Plan: LCSW to call client or spouse of client in next 2 weeks to talk further with client or spouse of client about CCM program services.  LCSW was not able to speak with client/spouse via phone on 03/14/2019. Thus client or spouse were not able to verbalize understanding  of instructions provided today and were not able to accept or decline a print copy of patient instruction materials.   [name].Kaylany Tesoriero MSW, LCSW Licensed Clinical Social Worker [facility] Care [facility] [phone]

## 2019-03-25 ENCOUNTER — Ambulatory Visit: Payer: Self-pay | Admitting: Licensed Clinical Social Worker

## 2019-03-25 ENCOUNTER — Other Ambulatory Visit: Payer: Self-pay | Admitting: Family Medicine

## 2019-03-25 DIAGNOSIS — J411 Mucopurulent chronic bronchitis: Secondary | ICD-10-CM

## 2019-03-25 DIAGNOSIS — R0602 Shortness of breath: Secondary | ICD-10-CM

## 2019-03-25 DIAGNOSIS — R5383 Other fatigue: Secondary | ICD-10-CM

## 2019-03-25 DIAGNOSIS — M25551 Pain in right hip: Secondary | ICD-10-CM

## 2019-03-25 DIAGNOSIS — I10 Essential (primary) hypertension: Secondary | ICD-10-CM

## 2019-03-25 NOTE — Patient Instructions (Addendum)
Licensed Clinical Social Worker Visit Information   LCSW reached out to [employer]/spouse, Karsten Fells  by phone today.   LCSW was not able to speak with client or spouse of client via phone on 03/25/2019 but requested client or her spouse please return call to LCSW at [phone] to discuss CCM program services.  Materials Provided: No  Follow Up Plan: LCSW to call client or spouse of client in next 2 weeks to talk with client or spouse of client about CCM program services.  LCSW was not able to speak with client or her spouse about CCM program. Thus, the patient or her spouse were not able to verbalize understanding of instructions provided today and were not able to accept or decline a print copy of patient instruction materials.   Kelton Pillar.Nathaniel Wakeley MSW, LCSW Licensed Clinical Social Worker [facility] [phone]

## 2019-03-25 NOTE — Chronic Care Management (AMB) (Signed)
  Care Management Note   Catherine Harrell is a 73 y.o. year old female who is a primary care patient of Stacks, Broadus John, MD. The CM team was consulted for assistance with chronic disease management and care coordination.   I reached out to [employer] [place]/spouse, Karsten Fells  by phone today.   I was not able to speak with client or spouse of client via phone on 03/25/2019 but requested client or her spouse please return call to LCSW at [phone] to discuss CCM program services.  Review of patient status, including review of consultants reports, relevant laboratory and other test results, and collaboration with appropriate care team members and the patient's provider was performed as part of comprehensive patient evaluation and provision of chronic care management services.   Follow Up Plan: LCSW to call client or spouse of client in next 2 weeks to talk with client or her spouse about CCM program services  Kelton Pillar.Josiel Gahm MSW, LCSW Licensed Clinical Social Worker [facility] [phone]

## 2019-04-08 ENCOUNTER — Ambulatory Visit: Payer: Self-pay | Admitting: Licensed Clinical Social Worker

## 2019-04-08 DIAGNOSIS — M25551 Pain in right hip: Secondary | ICD-10-CM

## 2019-04-08 DIAGNOSIS — R0602 Shortness of breath: Secondary | ICD-10-CM

## 2019-04-08 DIAGNOSIS — I10 Essential (primary) hypertension: Secondary | ICD-10-CM

## 2019-04-08 DIAGNOSIS — J441 Chronic obstructive pulmonary disease with (acute) exacerbation: Secondary | ICD-10-CM

## 2019-04-08 DIAGNOSIS — R5383 Other fatigue: Secondary | ICD-10-CM

## 2019-04-08 NOTE — Chronic Care Management (AMB) (Signed)
  Care Management Note   Catherine Harrell is a 73 y.o. year old female who is a primary care patient of Stacks, Cletus Gash, MD. The CM team was consulted for assistance with chronic disease management and care coordination.   I reached out to [place] by phone today.   Ms. Catherine Harrell were given information about Chronic Care Management services today including:  1. CCM service includes personalized support from designated clinical staff supervised by her physician, including individualized plan of care and coordination with other care providers 2. 24/7 contact phone numbers for assistance for urgent and routine care needs. 3. Service will only be billed when office clinical staff spend 20 minutes or more in a month to coordinate care. 4. Only one practitioner may furnish and bill the service in a calendar month. 5. The patient may stop CCM services at any time (effective at the end of the month) by phone call to the office staff. 6. The patient will be responsible for cost sharing (co-pay) of up to 20% of the service fee (after annual deductible is met). Patient did not agree to services and wishes to consider information provided before deciding about enrollment in CCM services.    Review of patient status, including review of consultants reports, relevant laboratory and other test results, and collaboration with appropriate care team members and the patient's provider was performed as part of comprehensive patient evaluation and provision of chronic care management services.    LCSW has called client home number several times in attempts to reach client or her spouse to discuss CCM program services. Dr. Livia Snellen had sent referral to CCM program regarding client needs regarding vision and hearing.  LCSW is waiting to receive response/return phone call from client or her spouse regarding CCM program services.   Follow Up Plan: LCSW to call client  or her spouse in next 2 weeks to talk with client or her spouse about CCM program services  Catherine Harrell.Catherine Harrell MSW, LCSW Licensed Clinical Social Worker [facility] Care [facility] [phone]

## 2019-04-08 NOTE — Patient Instructions (Addendum)
Licensed Clinical Water engineer Provided: No   I reached out to [place] by phone today.   Ms. Frech Meshell Abdulaziz were given information about Chronic Care Management services today including:  1. CCM service includes personalized support from designated clinical staff supervised by her physician, including individualized plan of care and coordination with other care providers 2. 24/7 contact phone numbers for assistance for urgent and routine care needs. 3. Service will only be billed when office clinical staff spend 20 minutes or more in a month to coordinate care. 4. Only one practitioner may furnish and bill the service in a calendar month. 5. The patient may stop CCM services at any time (effective at the end of the month) by phone call to the office staff. 6. The patient will be responsible for cost sharing (co-pay) of up to 20% of the service fee (after annual deductible is met). Patient /spouse did not agree to services and wishes to consider information provided before deciding about enrollment in CCM services.    Review of patient status, including review of consultants reports, relevant laboratory and other test results, and collaboration with appropriate care team members and the patient's provider was performed as part of comprehensive patient evaluation and provision of chronic care management services.    LCSW has called client home number several times in attempts to reach client or her spouse to discuss CCM program services. Dr. Livia Snellen had sent referral to CCM program regarding client needs regarding vision and hearing.  LCSW is waiting to receive response/return phone call from client or her spouse regarding CCM program services.   Follow Up Plan: LCSW to call client or her spouse in next 2 weeks to talk with client or her spouse about CCM program services   The patient/spouse verbalized understanding of  instructions provided today and declined a print copy of patient instruction materials.   [name].Damien Batty MSW, LCSW Licensed Clinical Social Worker [facility] [phone]

## 2019-04-22 ENCOUNTER — Ambulatory Visit: Payer: Self-pay | Admitting: Licensed Clinical Social Worker

## 2019-04-22 DIAGNOSIS — R5383 Other fatigue: Secondary | ICD-10-CM

## 2019-04-22 DIAGNOSIS — M25551 Pain in right hip: Secondary | ICD-10-CM

## 2019-04-22 DIAGNOSIS — J441 Chronic obstructive pulmonary disease with (acute) exacerbation: Secondary | ICD-10-CM

## 2019-04-22 DIAGNOSIS — I10 Essential (primary) hypertension: Secondary | ICD-10-CM

## 2019-04-22 DIAGNOSIS — R0602 Shortness of breath: Secondary | ICD-10-CM

## 2019-04-22 NOTE — Patient Instructions (Addendum)
Licensed Clinical Corporate investment banker Provided: No   I reached out to [place] by phone today. LCSW was not able to speak with client or spouse of client via phone today. However, LCSW left phone message for client/spouse regarding CCM program services and requested client/spouse return call to LCSW to discuss CCM program services. On phone message left for client/spouse, LCSW informed client/spouse that Dr. Livia Snellen had requested LCSW contact client regarding CCM program services available to possibly assist client.  Ms. Salberg was given information about Chronic Care Management services today including:  1. CCM service includes personalized support from designated clinical staff supervised by her physician, including individualized plan of care and coordination with other care providers 2. 24/7 contact phone numbers for assistance for urgent and routine care needs. 3. Service will only be billed when office clinical staff spend 20 minutes or more in a month to coordinate care. 4. Only one practitioner may furnish and bill the service in a calendar month. 5. The patient may stop CCM services at any time (effective at the end of the month) by phone call to the office staff. 6. The patient will be responsible for cost sharing (co-pay) of up to 20% of the service fee (after annual deductible is met). Patient did not agree to services and wishes to consider information provided before deciding about enrollment in care management services.    Follow Up Plan: LCSW is awaiting return call from client or spouse to discuss CCM program. LCSW plans to call client or spouse in next 2 weeks to try to discuss further CCM program   LCSW was not able to speak via phone with client or her spouse on 04/22/2019. Thus, patient/spouse were not able to verbalize understanding of instructions provided today and patient/spouse were not able to accept or decline a  print copy of patient instruction materials.   [name].Sherman Donaldson MSW, LCSW Licensed Clinical Social Worker [facility] [phone]

## 2019-04-22 NOTE — Chronic Care Management (AMB) (Signed)
  Care Management Note   Catherine Harrell is a 74 y.o. year old female who is a primary care patient of Stacks, Cletus Gash, MD. The CM team was consulted for assistance with chronic disease management and care coordination.   I reached out to [name] by phone today. LCSW was not able to speak with client or spouse of client via phone today. However, LCSW left phone message for client/spouse regarding CCM program services and requested client/spouse return call to LCSW to discuss CCM program services. On phone message left for client/spouse, LCSW informed client/spouse that Dr. Livia Snellen had requested LCSW contact client regarding CCM program services available to possibly assist client.  [name] was given information about Chronic Care Management services today including:  1. CCM service includes personalized support from designated clinical staff supervised by her physician, including individualized plan of care and coordination with other care providers 2. 24/7 contact phone numbers for assistance for urgent and routine care needs. 3. Service will only be billed when office clinical staff spend 20 minutes or more in a month to coordinate care. 4. Only one practitioner may furnish and bill the service in a calendar month. 5. The patient may stop CCM services at any time (effective at the end of the month) by phone call to the office staff. 6. The patient will be responsible for cost sharing (co-pay) of up to 20% of the service fee (after annual deductible is met). Patient did not agree to services and wishes to consider information provided before deciding about enrollment in care management services.    Follow Up Plan: LCSW is awaiting return call from client or spouse to discuss CCM program. LCSW plans to call client or spouse in next 2 weeks to try to discuss further CCM program  [name].Emaley Applin MSW, LCSW Licensed Clinical Social Worker [facility] [phone]

## 2019-05-03 ENCOUNTER — Telehealth: Payer: Self-pay | Admitting: Family Medicine

## 2019-05-03 ENCOUNTER — Ambulatory Visit (INDEPENDENT_AMBULATORY_CARE_PROVIDER_SITE_OTHER): Payer: Medicare HMO | Admitting: Physician Assistant

## 2019-05-03 ENCOUNTER — Encounter: Payer: Self-pay | Admitting: Physician Assistant

## 2019-05-03 ENCOUNTER — Other Ambulatory Visit: Payer: Self-pay

## 2019-05-03 VITALS — BP 153/78 | HR 126 | Temp 97.3°F | Ht 62.0 in | Wt 139.6 lb

## 2019-05-03 DIAGNOSIS — R300 Dysuria: Secondary | ICD-10-CM | POA: Diagnosis not present

## 2019-05-03 DIAGNOSIS — J411 Mucopurulent chronic bronchitis: Secondary | ICD-10-CM | POA: Diagnosis not present

## 2019-05-03 DIAGNOSIS — N3001 Acute cystitis with hematuria: Secondary | ICD-10-CM

## 2019-05-03 LAB — URINALYSIS, COMPLETE
Bilirubin, UA: NEGATIVE
Glucose, UA: NEGATIVE
Ketones, UA: NEGATIVE
Nitrite, UA: POSITIVE — AB
Specific Gravity, UA: 1.015 (ref 1.005–1.030)
Urobilinogen, Ur: 0.2 mg/dL (ref 0.2–1.0)
pH, UA: 6.5 (ref 5.0–7.5)

## 2019-05-03 LAB — MICROSCOPIC EXAMINATION
Epithelial Cells (non renal): >10 /hpf — AB (ref 0–10)
WBC, UA: >30 /hpf — AB (ref 0–5)

## 2019-05-03 MED ORDER — METHYLPREDNISOLONE ACETATE 80 MG/ML IJ SUSP
80.0000 mg | Freq: Once | INTRAMUSCULAR | Status: AC
  Administered 2019-05-03: 12:00:00 80 mg via INTRAMUSCULAR

## 2019-05-03 MED ORDER — UMECLIDINIUM-VILANTEROL 62.5-25 MCG/INH IN AEPB: 1.0000 | INHALATION_SPRAY | Freq: Every day | RESPIRATORY_TRACT | 5 refills | Status: DC

## 2019-05-03 MED ORDER — CEPHALEXIN 250 MG PO CAPS: 250.0000 mg | ORAL_CAPSULE | Freq: Three times a day (TID) | ORAL | 0 refills | Status: DC

## 2019-05-03 NOTE — Patient Instructions (Signed)
Keflex has been sent in his antibiotic for you to take 1 pill 3 times a day for 10 days  Anoro is a new inhaler that has been sent to the pharmacy for you, you just take 1 a day  Use your nebulizer every 4 hours  A prednisone shot given you to the day  We will work on getting you set up with an oxygen company.

## 2019-05-03 NOTE — Telephone Encounter (Signed)
Patient still seeing Dr Livia Snellen on 6/11. Just wanted him to know that her O2 is dropping at night and she coughs a lot at night time. Will discuss at appt

## 2019-05-03 NOTE — Progress Notes (Signed)
BP (!) 153/78   Pulse (!) 126   Temp (!) 97.3 F (36.3 C) (Oral)   Ht 5\' 2"  (1.575 m)   Wt 139 lb 9.6 oz (63.3 kg)   SpO2 (!) 87%   BMI 25.53 kg/m    Subjective:    Patient ID: Catherine Harrell, female    DOB: [birth year], 73 y.o.   MRN: [id number]  HPI: Catherine Harrell is a 73 y.o. female presenting on 05/03/2019 for Urinary Tract Infection  For several weeks she is also has significant amount of cough and wheezing.  She was unable to afford Trelegy.  And Symbicort bothered her mouth.  So she has only been using her DuoNeb nebulizer.  She states she only uses it a couple times a day.  We have gone over the need to use it up to every 4 hours while she is having such an exacerbation.  We have also discussed the possible need for oxygen at home.  She does have an appointment with Dr. Livia Snellen in 2 days.  And they will discuss that at that time.  This patient has had several days of dysuria, frequency and nocturia. There is also pain over the bladder in the suprapubic region, no back pain. Denies leakage or hematuria.  Denies fever or chills. No pain in flank area.    Past Medical History:  Diagnosis Date  . COPD (chronic obstructive pulmonary disease) ([place])   . Hypertension   . Osteoporosis   . TMJ arthralgia    left   Relevant past medical, surgical, family and social history reviewed and updated as indicated. Interim medical history since our last visit reviewed. Allergies and medications reviewed and updated. DATA REVIEWED: CHART IN EPIC  Family History reviewed for pertinent findings.  Review of Systems  Constitutional: Positive for chills and fatigue. Negative for activity change, appetite change and fever.  HENT: Negative for congestion, postnasal drip and sore throat.   Eyes: Negative.   Respiratory: Positive for cough, shortness of breath and wheezing.   Cardiovascular: Negative.  Negative for chest pain, palpitations and leg swelling.  Gastrointestinal:  Negative.   Genitourinary: Positive for dysuria and frequency.  Musculoskeletal: Negative.   Skin: Negative.   Neurological: Positive for headaches.    Allergies as of 05/03/2019   No Known Allergies     Medication List       Accurate as of May 03, 2019 11:36 AM. If you have any questions, ask your nurse or doctor.        cephALEXin 250 MG capsule Commonly known as:  Keflex Take 1 capsule (250 mg total) by mouth 3 (three) times daily. Started by:  Terald Sleeper, PA-C   gabapentin 300 MG capsule Commonly known as:  NEURONTIN Take 1 capsule (300 mg total) by mouth at bedtime.   ibandronate 150 MG tablet Commonly known as:  BONIVA 1 TABLET EVERY 30 DAYS W/ FULL GLASS OF WATER ON AN EMPTY STOMACH.DO NOT EAT/DRINK/LIE DOWN 30 MINS   ipratropium-albuterol 0.5-2.5 (3) MG/3ML Soln Commonly known as:  DuoNeb Take 3 mLs by nebulization every 4 (four) hours as needed.   losartan 25 MG tablet Commonly known as:  COZAAR TAKE 1 TABLET BY MOUTH EVERY DAY   predniSONE 5 MG tablet Commonly known as:  DELTASONE Take 2 tablets (10 mg total) by mouth daily with breakfast.   umeclidinium-vilanterol 62.5-25 MCG/INH Aepb Commonly known as:  Anoro Ellipta Inhale 1 puff into the lungs daily. Started by:  [employer]  Barnett ApplebaumS Lorena Clearman, PA-C          Objective:    BP (!) 153/78   Pulse (!) 126   Temp (!) 97.3 F (36.3 C) (Oral)   Ht 5\' 2"  (1.575 m)   Wt 139 lb 9.6 oz (63.3 kg)   SpO2 (!) 87%   BMI 25.53 kg/m   No Known Allergies  Wt Readings from Last 3 Encounters:  05/03/19 139 lb 9.6 oz (63.3 kg)  02/02/19 140 lb (63.5 kg)  11/03/18 138 lb (62.6 kg)    Physical Exam Constitutional:      Appearance: She is well-developed.  HENT:     Head: Normocephalic and atraumatic.  Eyes:     Conjunctiva/sclera: Conjunctivae normal.     Pupils: Pupils are equal, round, and reactive to light.  Cardiovascular:     Rate and Rhythm: Normal rate and regular rhythm.     Heart sounds: Normal heart  sounds.  Pulmonary:     Effort: Pulmonary effort is normal.     Breath sounds: Decreased air movement present. Examination of the right-upper field reveals decreased breath sounds and wheezing. Examination of the left-upper field reveals decreased breath sounds and wheezing. Examination of the right-middle field reveals decreased breath sounds and wheezing. Examination of the left-middle field reveals decreased breath sounds and wheezing. Examination of the right-lower field reveals decreased breath sounds and wheezing. Examination of the left-lower field reveals decreased breath sounds and wheezing. Decreased breath sounds and wheezing present.  Abdominal:     General: Bowel sounds are normal. There is no distension.     Palpations: Abdomen is soft. There is no mass.     Tenderness: There is abdominal tenderness in the suprapubic area. There is no guarding or rebound.  Skin:    General: Skin is warm and dry.     Findings: No rash.  Neurological:     Mental Status: She is alert and oriented to person, place, and time.     Deep Tendon Reflexes: Reflexes are normal and symmetric.  Psychiatric:        Behavior: Behavior normal.        Thought Content: Thought content normal.        Judgment: Judgment normal.         Assessment & Plan:   1. Dysuria - Urine Culture - Urinalysis, Complete  2. Mucopurulent chronic bronchitis (HCC) - umeclidinium-vilanterol (ANORO ELLIPTA) 62.5-25 MCG/INH AEPB; Inhale 1 puff into the lungs daily.  Dispense: 60 each; Refill: 5 - methylPREDNISolone acetate (DEPO-MEDROL) injection 80 mg  3. Acute cystitis with hematuria - cephALEXin (KEFLEX) 250 MG capsule; Take 1 capsule (250 mg total) by mouth 3 (three) times daily.  Dispense: 30 capsule; Refill: 0   Continue all other maintenance medications as listed above.  Follow up plan: Keep appointment in 2 days with Dr. Darlyn ReadStacks  Educational handout given for instructions  Remus LofflerAngel S. Tariana Moldovan PA-C [facility] [address]  [place], [place] [phone]   05/03/2019, 11:36 AM

## 2019-05-05 ENCOUNTER — Other Ambulatory Visit: Payer: Self-pay | Admitting: Physician Assistant

## 2019-05-05 ENCOUNTER — Ambulatory Visit (INDEPENDENT_AMBULATORY_CARE_PROVIDER_SITE_OTHER): Payer: Medicare HMO | Admitting: Family Medicine

## 2019-05-05 ENCOUNTER — Other Ambulatory Visit: Payer: Self-pay

## 2019-05-05 DIAGNOSIS — I10 Essential (primary) hypertension: Secondary | ICD-10-CM | POA: Diagnosis not present

## 2019-05-05 DIAGNOSIS — J411 Mucopurulent chronic bronchitis: Secondary | ICD-10-CM | POA: Diagnosis not present

## 2019-05-05 LAB — URINE CULTURE

## 2019-05-05 MED ORDER — SULFAMETHOXAZOLE-TRIMETHOPRIM 800-160 MG PO TABS: 1.0000 | ORAL_TABLET | Freq: Two times a day (BID) | ORAL | 0 refills | Status: DC

## 2019-05-05 NOTE — Progress Notes (Signed)
    Subjective:    Patient ID: Catherine Harrell, female    DOB: 11/29/1945, 73 y.o.   MRN: [id number]   HPI: Catherine Harrell is a 73 y.o. female presenting for six month follow up for her COPD. She continues to have chronic cough and dyspnea.  Follow-up of hypertension. Patient has no history of headache chest pain or shortness of breath or recent cough. Patient also denies symptoms of TIA such as numbness weakness lateralizing. Patient checks  blood pressure at home and has not had any elevated readings recently. Patient denies side effects from his medication. States taking it regularly.    Depression screen [facility] 2/9 05/03/2019 02/02/2019 11/03/2018 07/21/2018 04/20/2018  Decreased Interest 0 0 0 0 0  Down, Depressed, Hopeless 0 0 0 0 0  PHQ - 2 Score 0 0 0 0 0     Relevant past medical, surgical, family and social history reviewed and updated as indicated.  Interim medical history since our last visit reviewed. Allergies and medications reviewed and updated.  ROS:  Review of Systems  Constitutional: Negative.   HENT: Negative for congestion.   Eyes: Negative for visual disturbance.  Respiratory: Negative for shortness of breath.   Cardiovascular: Negative for chest pain.  Gastrointestinal: Negative for abdominal pain, constipation, diarrhea, nausea and vomiting.  Genitourinary: Negative for difficulty urinating.  Musculoskeletal: Positive for back pain (occasional). Negative for arthralgias and myalgias.  Neurological: Negative for headaches.  Psychiatric/Behavioral: Negative for sleep disturbance.     Social History   Tobacco Use  Smoking Status Former Smoker  . Packs/day: 1.00  . Years: 30.00  . Pack years: 30.00  . Types: Cigarettes  . Quit date: 01/16/2005  . Years since quitting: 14.3  Smokeless Tobacco Never Used       Objective:     Wt Readings from Last 3 Encounters:  05/03/19 139 lb 9.6 oz (63.3 kg)  02/02/19 140 lb (63.5 kg)  11/03/18 138  lb (62.6 kg)     Exam deferred. Pt. Harboring due to COVID 19. Phone visit performed.   Assessment & Plan:   1. Essential hypertension   2. Mucopurulent chronic bronchitis ([place])     No orders of the defined types were placed in this encounter.   No orders of the defined types were placed in this encounter.     Diagnoses and all orders for this visit:  Essential hypertension  Mucopurulent chronic bronchitis ([place])    Virtual Visit via telephone Note  I discussed the limitations, risks, security and privacy concerns of performing an evaluation and management service by telephone and the availability of in person appointments. The patient was identified with two identifiers. Pt.expressed understanding and agreed to proceed. Pt. Is at home. Dr. Livia Snellen is in his office.  Follow Up Instructions:   I discussed the assessment and treatment plan with the patient. The patient was provided an opportunity to ask questions and all were answered. The patient agreed with the plan and demonstrated an understanding of the instructions.   The patient was advised to call back or seek an in-person evaluation if the symptoms worsen or if the condition fails to improve as anticipated.   Total minutes including chart review and phone contact time: 22   Follow up plan: Return in about 3 months (around 08/05/2019), or if symptoms worsen or fail to improve.  Catherine Fraise, MD [place] continues to cough.

## 2019-05-06 ENCOUNTER — Ambulatory Visit: Payer: Self-pay | Admitting: Licensed Clinical Social Worker

## 2019-05-06 DIAGNOSIS — I10 Essential (primary) hypertension: Secondary | ICD-10-CM

## 2019-05-06 DIAGNOSIS — R0602 Shortness of breath: Secondary | ICD-10-CM

## 2019-05-06 DIAGNOSIS — J441 Chronic obstructive pulmonary disease with (acute) exacerbation: Secondary | ICD-10-CM

## 2019-05-06 DIAGNOSIS — M25551 Pain in right hip: Secondary | ICD-10-CM

## 2019-05-06 DIAGNOSIS — M810 Age-related osteoporosis without current pathological fracture: Secondary | ICD-10-CM

## 2019-05-06 DIAGNOSIS — R5383 Other fatigue: Secondary | ICD-10-CM

## 2019-05-06 NOTE — Chronic Care Management (AMB) (Signed)
  Chronic Care Management    Clinical Social Work CCM Outreach Note  05/06/2019 Name: Catherine Harrell MRN: [id number] DOB: 20-Jun-1946  Catherine Harrell is a 73 y.o. year old female who is a primary care patient of Stacks, Cletus Gash, MD . The CCM team was consulted for assistance with assessment of psychosocial needs.Marland Kitchen   LCSW reached out to [place] today by phone to introduce and offer CCM services.   [name] were given information about Chronic Care Management services today including:  1. CCM service includes personalized support from designated clinical staff supervised by her physician, including individualized plan of care and coordination with other care providers 2. 24/7 contact phone numbers for assistance for urgent and routine care needs. 3. Service will only be billed when office clinical staff spend 20 minutes or more in a month to coordinate care. 4. Only one practitioner may furnish and bill the service in a calendar month. 5. The patient may stop CCM services at any time (effective at the end of the month) by phone call to the office staff. 6. The patient will be responsible for cost sharing (co-pay) of up to 20% of the service fee (after annual deductible is met).  Patient/spouse Catherine Harrell did not agree to services and wish to consider information provided before deciding about enrollment in care management services. LCSW did speak with client via phone today. Client has hearing issues. LCSW reviewed some of the information with client about CCM program.  Client agreed for LCSW to speak with her spouse also about CCM program services for client LCSW also spoke via phone with [name] today to discuss CCM program services regarding Catherine Harrell. Again, client or spouse did not agree to services but wish to consider information provided before deciding about enrollment in CCM services.  Follow Up Plan:  LCSW to call client or her spouse in next 3 weeks to talk with client or spouse about CCM program services  [name].Costella Schwarz MSW, LCSW Licensed Clinical Social Worker [facility] Care Management [phone]

## 2019-05-06 NOTE — Patient Instructions (Addendum)
Licensed Clinical Water engineer Provided: No  LCSW reached out to [place] today by phone to introduce and offer CCM services.   Ms. Catherine Harrell were given information about Chronic Care Management services today including:  1. CCM service includes personalized support from designated clinical staff supervised by her physician, including individualized plan of care and coordination with other care providers 2. 24/7 contact phone numbers for assistance for urgent and routine care needs. 3. Service will only be billed when office clinical staff spend 20 minutes or more in a month to coordinate care. 4. Only one practitioner may furnish and bill the service in a calendar month. 5. The patient may stop CCM services at any time (effective at the end of the month) by phone call to the office staff. 6. The patient will be responsible for cost sharing (co-pay) of up to 20% of the service fee (after annual deductible is met).  Patient/spouse Catherine Harrell did not agree to services and wish to consider information provided before deciding about enrollment in care management services. LCSW did speak with client via phone today. Client has hearing issues. LCSW reviewed some of the information with client about CCM program.  Client agreed for LCSW to speak with her spouse also about CCM program services for client LCSW also spoke via phone with Catherine Harrell today to discuss CCM program services regarding Catherine Harrell. Again, client or spouse did not agree to services but wish to consider information provided before deciding about enrollment in CCM services.  Follow Up Plan: LCSW to call client or her spouse in next 3 weeks to talk with client or spouse about CCM program services  The patient/spouse Catherine Harrell verbalized understanding of instructions provided today and declined a print copy of patient instruction materials.    [name].Anelis Hrivnak MSW, LCSW Licensed Clinical Social Worker [facility] Care Management [phone]

## 2019-05-08 ENCOUNTER — Encounter: Payer: Self-pay | Admitting: Family Medicine

## 2019-05-26 ENCOUNTER — Ambulatory Visit: Payer: Self-pay | Admitting: Licensed Clinical Social Worker

## 2019-05-26 DIAGNOSIS — I10 Essential (primary) hypertension: Secondary | ICD-10-CM

## 2019-05-26 DIAGNOSIS — R5383 Other fatigue: Secondary | ICD-10-CM

## 2019-05-26 DIAGNOSIS — R0602 Shortness of breath: Secondary | ICD-10-CM

## 2019-05-26 DIAGNOSIS — J411 Mucopurulent chronic bronchitis: Secondary | ICD-10-CM

## 2019-05-26 DIAGNOSIS — M25551 Pain in right hip: Secondary | ICD-10-CM

## 2019-05-26 NOTE — Patient Instructions (Addendum)
Licensed Clinical Social Worker Visit Information   I reached out to [employer] / spouse of client by phone today. LCSW was not able to speak with [name] or her spouse via phone on 05/26/2019. However, LCSW did leave phone message for Aideen asking her or her spouse to please call LCSW at [phone] to discuss CCM program services    Materials Provided: No  Follow Up Plan: LCSW to call [name] or her spouse in next 3 weeks to talk with Laylanie or her spouse about CCM program services.  LCSW was not able to speak via phone with Braedyn or her spouse on 05/26/2019. Thus, the patient or her spouse were not able to verbalize understanding of instructions provided today and were not able to accept or decline a print copy of patient instruction materials.    [name].Rupal Childress MSW, LCSW Licensed Clinical Social Worker [facility] Care Management [phone]

## 2019-05-26 NOTE — Chronic Care Management (AMB) (Signed)
  Care Management Note   Catherine Harrell is a 73 y.o. year old female who is a primary care patient of Stacks, Cletus Gash, MD. The CM team was consulted for assistance with chronic disease management and care coordination.   I reached out to [employer] / spouse of client by phone today. LCSW was not able to speak with [name] or her spouse via phone on 05/26/2019. However, LCSW did leave phone message for Breahna asking her or her spouse to please call LCSW at [phone] to discuss CCM program services  Follow Up Plan: LCSW to call [name] or her spouse in next 3 weeks to talk with [facility] or her spouse about CCM program services.  [name].Suhayb Anzalone MSW, LCSW Licensed Clinical Social Worker [facility] Care Management [phone]

## 2019-05-31 ENCOUNTER — Other Ambulatory Visit: Payer: Self-pay | Admitting: Family Medicine

## 2019-06-16 ENCOUNTER — Ambulatory Visit: Payer: Self-pay | Admitting: Licensed Clinical Social Worker

## 2019-06-16 DIAGNOSIS — R0602 Shortness of breath: Secondary | ICD-10-CM

## 2019-06-16 DIAGNOSIS — J411 Mucopurulent chronic bronchitis: Secondary | ICD-10-CM

## 2019-06-16 DIAGNOSIS — I10 Essential (primary) hypertension: Secondary | ICD-10-CM

## 2019-06-16 DIAGNOSIS — M25551 Pain in right hip: Secondary | ICD-10-CM

## 2019-06-16 DIAGNOSIS — R5383 Other fatigue: Secondary | ICD-10-CM

## 2019-06-16 NOTE — Patient Instructions (Addendum)
Licensed Clinical Water engineer Provided:  No  I reached out to [employer] [name] by phone today. LCSW was not able to speak with Tesslyn or with [name] via phone today. LCSW left phone message for [place] and [name] requesting that Hisae or [name] return call to LCSW at [phone] to discuss CCM program services.  Follow Up Plan: LCSW to call Jeanice Enis/James Vivero in next 3 weeks to talk  with [name] or [name] about CCM program services.  LCSW was not able to speak with [name] or [name] via phone today. Thus patient or her spouse were not able to verbalize understanding of instructions provided today and were not able to accept or decline a print copy of patient instruction materials.   [name].Nicky Milhouse MSW, LCSW Licensed Clinical Social Worker [facility] Care Management [phone]

## 2019-06-16 NOTE — Chronic Care Management (AMB) (Signed)
  Care Management Note   Catherine Harrell is a 73 y.o. year old female who is a primary care patient of Stacks, Cletus Gash, MD. The CM team was consulted for assistance with chronic disease management and care coordination.   I reached out to [facility] [name] by phone today. LCSW was not able to speak with Anyah or with [name] via phone today. LCSW left phone message for Nene and [name] requesting that Annisten or [name] return call to LCSW at [phone] to discuss CCM program services.   Follow Up Plan: LCSW to call Kam Riding/James Blaschke in next 3 weeks to talk with [name] or [name] about CCM program  [name].Noble Bodie MSW, LCSW Licensed Clinical Social Worker [facility] Care Management [phone]

## 2019-06-19 ENCOUNTER — Other Ambulatory Visit: Payer: Self-pay | Admitting: Family Medicine

## 2019-06-21 ENCOUNTER — Telehealth: Payer: Self-pay | Admitting: Family Medicine

## 2019-06-23 ENCOUNTER — Other Ambulatory Visit: Payer: Self-pay | Admitting: Family Medicine

## 2019-07-07 ENCOUNTER — Ambulatory Visit: Payer: Self-pay | Admitting: Licensed Clinical Social Worker

## 2019-07-07 DIAGNOSIS — M25551 Pain in right hip: Secondary | ICD-10-CM

## 2019-07-07 DIAGNOSIS — I10 Essential (primary) hypertension: Secondary | ICD-10-CM

## 2019-07-07 DIAGNOSIS — R5383 Other fatigue: Secondary | ICD-10-CM

## 2019-07-07 DIAGNOSIS — J441 Chronic obstructive pulmonary disease with (acute) exacerbation: Secondary | ICD-10-CM

## 2019-07-07 DIAGNOSIS — R0602 Shortness of breath: Secondary | ICD-10-CM

## 2019-07-07 NOTE — Patient Instructions (Addendum)
Licensed Clinical Water engineer Provided: No  I reached out to [employer] [name] by phone today. LCSW was not able to speak with Vincenta or with [name] via phone today. LCSW left phone message for Xyla and [name] requesting that [name] or [name] return call to LCSW at [phone] to discuss CCM program services  Follow Up Plan:LCSW to call client or her spouse in next 4 weeks to talk with client or her spouse about CCM program services.   LCSW was not able to speak with client or her spouse today via phone. Thus, the patient or her spouse were not able to  verbalize understanding of instructions provided today were not able to accept or decline a print copy of patient instruction materials.   [name].Chace Klippel MSW, LCSW Licensed Clinical Social Worker [facility] Care Management [phone]

## 2019-07-07 NOTE — Chronic Care Management (AMB) (Signed)
  Care Management Note   Catherine Harrell is a 73 y.o. year old female who is a primary care patient of Stacks, Cletus Gash, MD. The CM team was consulted for assistance with chronic disease management and care coordination.   I reached out to [facility] [name] by phone today. LCSW was not able to speak with [name] or with [name] via phone today. LCSW left phone message for [name] and [name] requesting that [name] or [name] return call to LCSW at [phone] to discuss CCM program services  Review of patient status, including review of consultants reports, relevant laboratory and other test results, and collaboration with appropriate care team members and the patient's provider was performed as part of comprehensive patient evaluation and provision of chronic care management services.   Follow Up Plan: LCSW to call [name] or spouse of client in next 4 weeks to talk with [name] or her spouse about CCM program services  [name].Coral Timme MSW, LCSW Licensed Clinical Social Worker [facility] Care Management [phone]

## 2019-07-21 ENCOUNTER — Other Ambulatory Visit: Payer: Self-pay | Admitting: Family Medicine

## 2019-08-05 ENCOUNTER — Ambulatory Visit: Payer: Self-pay | Admitting: Licensed Clinical Social Worker

## 2019-08-05 DIAGNOSIS — J441 Chronic obstructive pulmonary disease with (acute) exacerbation: Secondary | ICD-10-CM

## 2019-08-05 DIAGNOSIS — R5383 Other fatigue: Secondary | ICD-10-CM

## 2019-08-05 DIAGNOSIS — R0602 Shortness of breath: Secondary | ICD-10-CM

## 2019-08-05 DIAGNOSIS — M810 Age-related osteoporosis without current pathological fracture: Secondary | ICD-10-CM

## 2019-08-05 DIAGNOSIS — M25551 Pain in right hip: Secondary | ICD-10-CM

## 2019-08-05 DIAGNOSIS — I10 Essential (primary) hypertension: Secondary | ICD-10-CM

## 2019-08-05 NOTE — Patient Instructions (Addendum)
Licensed Clinical Social Worker Visit Information   Materials Provided: No  LCSW has spoken several times with Catherine Harrell about CCM program services including support thorough RNCM and LCSW. Catherine Harrell has not agreed to [facility] support services. She is aware that CCM program services are available if she should be interested in CCM support in the future. At present, she has declined CCM program support  LCSW was not able to speak via phone with client today.Thus patient was not able to verbalize understanding of instructions provided today and was not able to accept or decline a print copy of patient instruction materials.   Catherine Harrell.Clarissa Laird MSW, LCSW Licensed Clinical Social Worker [facility] Care Management [phone]

## 2019-08-05 NOTE — Chronic Care Management (AMB) (Signed)
  Care Management Note   Catherine Harrell is a 73 y.o. year old female who is a primary care patient of Stacks, Cletus Gash, MD. The CM team was consulted for assistance with chronic disease management and care coordination.   I reached out to [employer] by phone today.   [name] was given information about Chronic Care Management services today including:  1. CCM service includes personalized support from designated clinical staff supervised by her physician, including individualized plan of care and coordination with other care providers 2. 24/7 contact phone numbers for assistance for urgent and routine care needs. 3. Service will only be billed when office clinical staff spend 20 minutes or more in a month to coordinate care. 4. Only one practitioner may furnish and bill the service in a calendar month. 5. The patient may stop CCM services at any time (effective at the end of the month) by phone call to the office staff. 6. The patient will be responsible for cost sharing (co-pay) of up to 20% of the service fee (after annual deductible is met). Patient did not agree to enrollment in care management services and does not wish to consider at this time.   Review of patient status, including review of consultants reports, relevant laboratory and other test results, and collaboration with appropriate care team members and the patient's provider was performed as part of comprehensive patient evaluation and provision of chronic care management services.   LCSW has spoken several times with [name] about CCM program services including support thorough RNCM and LCSW. [name] has not agreed to [facility] support services. She is aware that CCM program services are available if she should be interested in CCM support in the future. At present, she has declined CCM program support   [name].Athony Coppa MSW, LCSW Licensed Clinical Social Worker [facility]  Care Management [phone]

## 2019-08-18 ENCOUNTER — Other Ambulatory Visit: Payer: Self-pay

## 2019-08-18 ENCOUNTER — Ambulatory Visit (INDEPENDENT_AMBULATORY_CARE_PROVIDER_SITE_OTHER): Payer: Medicare HMO

## 2019-08-18 ENCOUNTER — Ambulatory Visit: Payer: Medicare HMO | Admitting: Family Medicine

## 2019-08-18 ENCOUNTER — Encounter: Payer: Self-pay | Admitting: Family Medicine

## 2019-08-18 ENCOUNTER — Ambulatory Visit (INDEPENDENT_AMBULATORY_CARE_PROVIDER_SITE_OTHER): Payer: Medicare HMO | Admitting: Family Medicine

## 2019-08-18 VITALS — BP 147/87 | HR 95 | Temp 98.7°F | Resp 24 | Ht 62.0 in | Wt 136.4 lb

## 2019-08-18 DIAGNOSIS — Z23 Encounter for immunization: Secondary | ICD-10-CM

## 2019-08-18 DIAGNOSIS — M810 Age-related osteoporosis without current pathological fracture: Secondary | ICD-10-CM | POA: Diagnosis not present

## 2019-08-18 DIAGNOSIS — I10 Essential (primary) hypertension: Secondary | ICD-10-CM | POA: Diagnosis not present

## 2019-08-18 DIAGNOSIS — J411 Mucopurulent chronic bronchitis: Secondary | ICD-10-CM

## 2019-08-18 DIAGNOSIS — J42 Unspecified chronic bronchitis: Secondary | ICD-10-CM | POA: Diagnosis not present

## 2019-08-18 MED ORDER — GABAPENTIN 300 MG PO CAPS: 300.0000 mg | ORAL_CAPSULE | Freq: Every day | ORAL | 1 refills | Status: DC

## 2019-08-18 MED ORDER — ANORO ELLIPTA 62.5-25 MCG/INH IN AEPB: 1.0000 | INHALATION_SPRAY | Freq: Every day | RESPIRATORY_TRACT | 5 refills | Status: DC

## 2019-08-18 MED ORDER — LOSARTAN POTASSIUM 25 MG PO TABS: 25.0000 mg | ORAL_TABLET | Freq: Every day | ORAL | 1 refills | Status: DC

## 2019-08-18 MED ORDER — PREDNISONE 5 MG PO TABS: 15.0000 mg | ORAL_TABLET | Freq: Every day | ORAL | 5 refills | Status: DC

## 2019-08-18 NOTE — Progress Notes (Addendum)
Subjective:  Patient ID: Catherine Harrell, female    DOB: [birth year]  Age: 73 y.o. MRN: [id number]  CC: Medical Management of Chronic Issues (needs refills, lab work)   HPI [employer] presents for  follow-up of hypertension. Patient has no history of headache chest pain or shortness of breath or recent cough. Patient also denies symptoms of TIA such as focal numbness or weakness. Patient denies side effects from medication. States taking it regularly.   History Catherine Harrell has a past medical history of COPD (chronic obstructive pulmonary disease) (HCC), Hypertension, Osteoporosis, and TMJ arthralgia.   She has no past surgical history on file.   Her family history includes COPD in her father and sister; Cancer in her daughter and son; Diabetes in her mother and sister; Healthy in her son and son; Heart attack in her sister; Heart disease in her brother, father, and sister; Hypertension in her mother; Kidney disease in her daughter.She reports that she quit smoking about 14 years ago. Her smoking use included cigarettes. She has a 30.00 pack-year smoking history. She has never used smokeless tobacco. She reports that she does not drink alcohol or use drugs.  Current Outpatient Medications on File Prior to Visit  Medication Sig Dispense Refill  . ibandronate (BONIVA) 150 MG tablet Take 1 tablet (150 mg total) by mouth every 30 (thirty) days. Do not eat/drink/lie down 30 mins (Needs to be seen before next refill) 3 tablet 0  . ipratropium-albuterol (DUONEB) 0.5-2.5 (3) MG/3ML SOLN INHALE CONTENTS OF 1 VIAL VIA NEBULIZER EVERY 4 HOURS AS NEEDED 360 mL 0   No current facility-administered medications on file prior to visit.     ROS Review of Systems  Constitutional: Negative.   HENT: Negative.   Eyes: Negative for visual disturbance.  Respiratory: Positive for shortness of breath.   Cardiovascular: Negative for chest pain.  Gastrointestinal: Negative for abdominal pain.   Musculoskeletal: Negative for arthralgias.    Objective:  BP (!) 147/87   Pulse 95   Temp 98.7 F (37.1 C) (Temporal)   Resp (!) 24   Ht 5\' 2"  (1.575 m)   Wt 136 lb 6.4 oz (61.9 kg)   SpO2 94%   BMI 24.95 kg/m   BP Readings from Last 3 Encounters:  08/18/19 (!) 147/87  05/03/19 (!) 153/78  02/02/19 (!) 150/81    Wt Readings from Last 3 Encounters:  08/18/19 136 lb 6.4 oz (61.9 kg)  05/03/19 139 lb 9.6 oz (63.3 kg)  02/02/19 140 lb (63.5 kg)     Physical Exam Constitutional:      General: She is not in acute distress.    Appearance: She is well-developed.     Comments: Pt is nearly completely deaf.   HENT:     Head: Normocephalic and atraumatic.  Eyes:     Conjunctiva/sclera: Conjunctivae normal.     Pupils: Pupils are equal, round, and reactive to light.  Neck:     Musculoskeletal: Normal range of motion and neck supple.     Thyroid: No thyromegaly.  Cardiovascular:     Rate and Rhythm: Normal rate and regular rhythm.     Heart sounds: Normal heart sounds. No murmur.  Pulmonary:     Effort: Pulmonary effort is normal. No respiratory distress.     Breath sounds: Wheezing present. No rales.     Comments: Sounds faint, decreased expiratory phase Abdominal:     General: Bowel sounds are normal. There is no distension.  Palpations: Abdomen is soft.     Tenderness: There is no abdominal tenderness.  Musculoskeletal: Normal range of motion.  Lymphadenopathy:     Cervical: No cervical adenopathy.  Skin:    General: Skin is warm and dry.  Neurological:     Mental Status: She is alert and oriented to person, place, and time.  Psychiatric:        Behavior: Behavior normal.        Thought Content: Thought content normal.        Judgment: Judgment normal.       Assessment & Plan:   Catherine Harrell was seen today for medical management of chronic issues.  Diagnoses and all orders for this visit:  Mucopurulent chronic bronchitis ([place]) -     DG Chest 2 View;  Future -     Ambulatory referral to Pulmonology -     umeclidinium-vilanterol (ANORO ELLIPTA) 62.5-25 MCG/INH AEPB; Inhale 1 puff into the lungs daily.  Essential hypertension  Age-related osteoporosis without current pathological fracture  Need for immunization against influenza -     Flu Vaccine QUAD High Dose(Fluad)  Other orders -     gabapentin (NEURONTIN) 300 MG capsule; Take 1 capsule (300 mg total) by mouth at bedtime. -     predniSONE (DELTASONE) 5 MG tablet; Take 3 tablets (15 mg total) by mouth daily with breakfast. -     losartan (COZAAR) 25 MG tablet; Take 1 tablet (25 mg total) by mouth daily.   Allergies as of 08/18/2019   No Known Allergies     Medication List       Accurate as of August 18, 2019 11:59 PM. If you have any questions, ask your nurse or doctor.        STOP taking these medications   sulfamethoxazole-trimethoprim 800-160 MG tablet Commonly known as: Bactrim DS Stopped by: Claretta Fraise, MD     TAKE these medications   Anoro Ellipta 62.5-25 MCG/INH Aepb Generic drug: umeclidinium-vilanterol Inhale 1 puff into the lungs daily.   gabapentin 300 MG capsule Commonly known as: NEURONTIN Take 1 capsule (300 mg total) by mouth at bedtime.   ibandronate 150 MG tablet Commonly known as: BONIVA Take 1 tablet (150 mg total) by mouth every 30 (thirty) days. Do not eat/drink/lie down 30 mins (Needs to be seen before next refill)   ipratropium-albuterol 0.5-2.5 (3) MG/3ML Soln Commonly known as: DUONEB INHALE CONTENTS OF 1 VIAL VIA NEBULIZER EVERY 4 HOURS AS NEEDED   losartan 25 MG tablet Commonly known as: COZAAR Take 1 tablet (25 mg total) by mouth daily.   predniSONE 5 MG tablet Commonly known as: DELTASONE Take 3 tablets (15 mg total) by mouth daily with breakfast. What changed: how much to take Changed by: Claretta Fraise, MD       Meds ordered this encounter  Medications  . gabapentin (NEURONTIN) 300 MG capsule    Sig: Take 1  capsule (300 mg total) by mouth at bedtime.    Dispense:  90 capsule    Refill:  1  . predniSONE (DELTASONE) 5 MG tablet    Sig: Take 3 tablets (15 mg total) by mouth daily with breakfast.    Dispense:  90 tablet    Refill:  5  . umeclidinium-vilanterol (ANORO ELLIPTA) 62.5-25 MCG/INH AEPB    Sig: Inhale 1 puff into the lungs daily.    Dispense:  60 each    Refill:  5  . losartan (COZAAR) 25 MG tablet    Sig:  Take 1 tablet (25 mg total) by mouth daily.    Dispense:  90 tablet    Refill:  1      Follow-up: Return in about 3 months (around 11/17/2019), or if symptoms worsen or fail to improve.  Mechele ClaudeWarren Reuben Knoblock, M.D.

## 2019-08-22 MED ORDER — LEVOFLOXACIN 500 MG PO TABS: 500.0000 mg | ORAL_TABLET | Freq: Every day | ORAL | 0 refills | Status: DC

## 2019-08-23 ENCOUNTER — Other Ambulatory Visit: Payer: Self-pay | Admitting: Family Medicine

## 2019-08-25 ENCOUNTER — Other Ambulatory Visit: Payer: Self-pay | Admitting: Family Medicine

## 2019-08-25 ENCOUNTER — Telehealth: Payer: Self-pay | Admitting: Family Medicine

## 2019-08-25 MED ORDER — AMOXICILLIN-POT CLAVULANATE 875-125 MG PO TABS: 1.0000 | ORAL_TABLET | Freq: Two times a day (BID) | ORAL | 0 refills | Status: DC

## 2019-08-25 NOTE — Telephone Encounter (Signed)
I sent in the requested prescription 

## 2019-09-13 ENCOUNTER — Other Ambulatory Visit: Payer: Self-pay | Admitting: Family Medicine

## 2019-09-13 NOTE — Telephone Encounter (Signed)
Dr. Livia Snellen was aware and sent in new medication.

## 2019-10-08 ENCOUNTER — Other Ambulatory Visit: Payer: Self-pay | Admitting: Family Medicine

## 2019-10-25 ENCOUNTER — Institutional Professional Consult (permissible substitution): Payer: Commercial Managed Care - HMO | Admitting: Critical Care Medicine

## 2019-10-27 ENCOUNTER — Other Ambulatory Visit: Payer: Self-pay | Admitting: Family Medicine

## 2019-10-27 DIAGNOSIS — J411 Mucopurulent chronic bronchitis: Secondary | ICD-10-CM

## 2019-10-28 NOTE — Telephone Encounter (Signed)
Pt needs new machine. Rx faxed to [facility]

## 2019-11-22 ENCOUNTER — Other Ambulatory Visit: Payer: Self-pay | Admitting: Family Medicine

## 2019-12-05 ENCOUNTER — Other Ambulatory Visit: Payer: Self-pay

## 2019-12-06 ENCOUNTER — Ambulatory Visit (INDEPENDENT_AMBULATORY_CARE_PROVIDER_SITE_OTHER): Payer: Medicare HMO | Admitting: Family Medicine

## 2019-12-06 ENCOUNTER — Encounter: Payer: Self-pay | Admitting: Family Medicine

## 2019-12-06 ENCOUNTER — Other Ambulatory Visit: Payer: Self-pay

## 2019-12-06 VITALS — BP 134/75 | HR 101 | Temp 98.6°F | Resp 24 | Ht 62.0 in | Wt 136.8 lb

## 2019-12-06 DIAGNOSIS — M810 Age-related osteoporosis without current pathological fracture: Secondary | ICD-10-CM

## 2019-12-06 DIAGNOSIS — E559 Vitamin D deficiency, unspecified: Secondary | ICD-10-CM

## 2019-12-06 DIAGNOSIS — J411 Mucopurulent chronic bronchitis: Secondary | ICD-10-CM | POA: Diagnosis not present

## 2019-12-06 DIAGNOSIS — Z87891 Personal history of nicotine dependence: Secondary | ICD-10-CM

## 2019-12-06 DIAGNOSIS — H9113 Presbycusis, bilateral: Secondary | ICD-10-CM | POA: Diagnosis not present

## 2019-12-06 DIAGNOSIS — I10 Essential (primary) hypertension: Secondary | ICD-10-CM | POA: Diagnosis not present

## 2019-12-06 MED ORDER — BUDESONIDE-FORMOTEROL FUMARATE 80-4.5 MCG/ACT IN AERO: 2.0000 | INHALATION_SPRAY | Freq: Two times a day (BID) | RESPIRATORY_TRACT | 3 refills | Status: DC

## 2019-12-06 MED ORDER — DENOSUMAB 60 MG/ML ~~LOC~~ SOSY: 60.0000 mg | PREFILLED_SYRINGE | Freq: Once | SUBCUTANEOUS | 0 refills | Status: AC

## 2019-12-06 NOTE — Patient Instructions (Signed)
.  Call [facility] in [place] at [phone] or [phone]

## 2019-12-06 NOTE — Progress Notes (Signed)
Subjective:  Patient ID: Catherine Harrell, female    DOB: Nov 18, 1946  Age: 74 y.o. MRN: [id number]  CC: Follow-up (3 month)   HPI Wauneta Alexas Basulto presents for  follow-up of hypertension. Patient has no history of headache chest pain or shortness of breath or recent cough. Patient also denies symptoms of TIA such as focal numbness or weakness. Patient denies side effects from medication. States taking it regularly.   Patient suffers from COPD.  Generally a chronic bronchitis.  She is due for renewal of her inhalers but tells me that the one prescribed before was too expensive.  She is also due for a CT for lung cancer.  She has trouble arranging transportation for that. Denies cough recently.  She does have a chronic cough however it that is no different from usual.  On some days.  She denies any fever chills or sweats.   History Karolyne has a past medical history of COPD (chronic obstructive pulmonary disease) (HCC), Hypertension, Osteoporosis, and TMJ arthralgia.   She has no past surgical history on file.   Her family history includes COPD in her father and sister; Cancer in her daughter and son; Diabetes in her mother and sister; Healthy in her son and son; Heart attack in her sister; Heart disease in her brother, father, and sister; Hypertension in her mother; Kidney disease in her daughter.She reports that she quit smoking about 14 years ago. Her smoking use included cigarettes. She has a 30.00 pack-year smoking history. She has never used smokeless tobacco. She reports that she does not drink alcohol or use drugs.  Current Outpatient Medications on File Prior to Visit  Medication Sig Dispense Refill  . gabapentin (NEURONTIN) 300 MG capsule Take 1 capsule (300 mg total) by mouth at bedtime. 90 capsule 1  . ipratropium-albuterol (DUONEB) 0.5-2.5 (3) MG/3ML SOLN USE 1 VIAL IN NEBULIZER EVERY 4 HOURS AS NEEDED 360 mL 0  . losartan (COZAAR) 25 MG tablet Take 1 tablet (25  mg total) by mouth daily. 90 tablet 1  . predniSONE (DELTASONE) 5 MG tablet Take 15 mg by mouth daily with breakfast.     No current facility-administered medications on file prior to visit.    ROS Review of Systems  Constitutional: Negative.   HENT: Positive for hearing loss.   Eyes: Negative for visual disturbance.  Respiratory: Negative for shortness of breath.   Cardiovascular: Negative for chest pain.  Gastrointestinal: Negative for abdominal pain.  Musculoskeletal: Negative for arthralgias.    Objective:  BP 134/75   Pulse (!) 101   Temp 98.6 F (37 C) (Temporal)   Resp (!) 24   Ht 5\' 2"  (1.575 m)   Wt 136 lb 12.8 oz (62.1 kg)   SpO2 97%   BMI 25.02 kg/m   BP Readings from Last 3 Encounters:  12/06/19 134/75  08/18/19 (!) 147/87  05/03/19 (!) 153/78    Wt Readings from Last 3 Encounters:  12/06/19 136 lb 12.8 oz (62.1 kg)  08/18/19 136 lb 6.4 oz (61.9 kg)  05/03/19 139 lb 9.6 oz (63.3 kg)     Physical Exam Constitutional:      General: She is not in acute distress.    Appearance: She is well-developed.  HENT:     Head: Normocephalic and atraumatic.     Comments: Pt. Cannot hear adequately for conversation. Written questions used for evaluation.    Right Ear: External ear normal.     Left Ear: External ear normal.  Nose: Nose normal.  Eyes:     Conjunctiva/sclera: Conjunctivae normal.     Pupils: Pupils are equal, round, and reactive to light.  Neck:     Thyroid: No thyromegaly.  Cardiovascular:     Rate and Rhythm: Normal rate and regular rhythm.     Heart sounds: Normal heart sounds. No murmur.  Pulmonary:     Effort: Pulmonary effort is normal. No respiratory distress.     Breath sounds: Normal breath sounds. No wheezing or rales.  Abdominal:     General: Bowel sounds are normal. There is no distension.     Palpations: Abdomen is soft.     Tenderness: There is no abdominal tenderness.  Musculoskeletal:     Cervical back: Normal range of  motion and neck supple.  Lymphadenopathy:     Cervical: No cervical adenopathy.  Skin:    General: Skin is warm and dry.  Neurological:     Mental Status: She is alert and oriented to person, place, and time.     Deep Tendon Reflexes: Reflexes are normal and symmetric.  Psychiatric:        Behavior: Behavior normal.        Thought Content: Thought content normal.        Judgment: Judgment normal.       Assessment & Plan:   Verline was seen today for follow-up.  Diagnoses and all orders for this visit:  Essential hypertension -     CBC -     CMP -     Lipid  Mucopurulent chronic bronchitis (HCC) -     CBC -     CMP -     budesonide-formoterol (SYMBICORT) 80-4.5 MCG/ACT inhaler; Inhale 2 puffs into the lungs 2 (two) times daily. -     CT Lung Ca screen; Future  Presbycusis of both ears  Vitamin D deficiency -     VITAMIN D  Age-related osteoporosis without current pathological fracture -     denosumab (PROLIA) 60 MG/ML SOSY injection; Inject 60 mg into the skin once for 1 dose.  Former cigarette smoker -     CT Lung Ca screen; Future   Allergies as of 12/06/2019      Reactions   Levofloxacin       Medication List       Accurate as of December 06, 2019 11:59 PM. If you have any questions, ask your nurse or doctor.        STOP taking these medications   amoxicillin-clavulanate 875-125 MG tablet Commonly known as: AUGMENTIN Stopped by: [name], MD   Anoro Ellipta 62.5-25 MCG/INH Aepb Generic drug: umeclidinium-vilanterol Stopped by: [name], MD   ibandronate 150 MG tablet Commonly known as: BONIVA Stopped by: [name], MD     TAKE these medications   budesonide-formoterol 80-4.5 MCG/ACT inhaler Commonly known as: SYMBICORT Inhale 2 puffs into the lungs 2 (two) times daily. Started by: [name], MD   denosumab 60 MG/ML Sosy injection Commonly known as: PROLIA Inject 60 mg into the skin once for 1 dose. Started by: [name], MD   gabapentin 300 MG capsule Commonly known as: NEURONTIN Take 1 capsule (300 mg total) by mouth at bedtime.   ipratropium-albuterol 0.5-2.5 (3) MG/3ML Soln Commonly known as: DUONEB USE 1 VIAL IN NEBULIZER EVERY 4 HOURS AS NEEDED   losartan 25 MG tablet Commonly known as: COZAAR Take 1 tablet (25 mg total) by mouth daily.   predniSONE 5 MG tablet Commonly  known as: DELTASONE Take 15 mg by mouth daily with breakfast. What changed: Another medication with the same name was removed. Continue taking this medication, and follow the directions you see here. Changed by: Claretta Fraise, MD       Meds ordered this encounter  Medications  . budesonide-formoterol (SYMBICORT) 80-4.5 MCG/ACT inhaler    Sig: Inhale 2 puffs into the lungs 2 (two) times daily.    Dispense:  1 Inhaler    Refill:  3  . denosumab (PROLIA) 60 MG/ML SOSY injection    Sig: Inject 60 mg into the skin once for 1 dose.    Dispense:  1 mL    Refill:  0      Follow-up: Return in about 6 months (around 06/04/2020).  Claretta Fraise, M.D.

## 2019-12-07 LAB — CBC WITH DIFFERENTIAL/PLATELET
Basophils Absolute: 0 10*3/uL (ref 0.0–0.2)
Basos: 0 %
EOS (ABSOLUTE): 0 10*3/uL (ref 0.0–0.4)
Eos: 0 %
Hematocrit: 42 % (ref 34.0–46.6)
Hemoglobin: 14.1 g/dL (ref 11.1–15.9)
Immature Grans (Abs): 0.1 10*3/uL (ref 0.0–0.1)
Immature Granulocytes: 1 %
Lymphocytes Absolute: 0.7 10*3/uL (ref 0.7–3.1)
Lymphs: 9 %
MCH: 30.4 pg (ref 26.6–33.0)
MCHC: 33.6 g/dL (ref 31.5–35.7)
MCV: 91 fL (ref 79–97)
Monocytes Absolute: 0.1 10*3/uL (ref 0.1–0.9)
Monocytes: 2 %
Neutrophils Absolute: 7.4 10*3/uL — ABNORMAL HIGH (ref 1.4–7.0)
Neutrophils: 88 %
Platelets: 359 10*3/uL (ref 150–450)
RBC: 4.64 x10E6/uL (ref 3.77–5.28)
RDW: 11.9 % (ref 11.7–15.4)
WBC: 8.3 10*3/uL (ref 3.4–10.8)

## 2019-12-07 LAB — LIPID PANEL
Chol/HDL Ratio: 2.4 ratio (ref 0.0–4.4)
Cholesterol, Total: 191 mg/dL (ref 100–199)
HDL: 78 mg/dL (ref >39)
LDL Chol Calc (NIH): 99 mg/dL (ref 0–99)
Triglycerides: 79 mg/dL (ref 0–149)
VLDL Cholesterol Cal: 14 mg/dL (ref 5–40)

## 2019-12-07 LAB — CMP14+EGFR
ALT: 12 IU/L (ref 0–32)
AST: 12 IU/L (ref 0–40)
Albumin/Globulin Ratio: 1.5 (ref 1.2–2.2)
Albumin: 4.2 g/dL (ref 3.7–4.7)
Alkaline Phosphatase: 57 IU/L (ref 39–117)
BUN/Creatinine Ratio: 11 — ABNORMAL LOW (ref 12–28)
BUN: 10 mg/dL (ref 8–27)
Bilirubin Total: 0.3 mg/dL (ref 0.0–1.2)
CO2: 25 mmol/L (ref 20–29)
Calcium: 9.6 mg/dL (ref 8.7–10.3)
Chloride: 96 mmol/L (ref 96–106)
Creatinine, Ser: 0.91 mg/dL (ref 0.57–1.00)
GFR calc Af Amer: 71 mL/min/{1.73_m2} (ref >59)
GFR calc non Af Amer: 62 mL/min/{1.73_m2} (ref >59)
Globulin, Total: 2.8 g/dL (ref 1.5–4.5)
Glucose: 107 mg/dL — ABNORMAL HIGH (ref 65–99)
Potassium: 4.8 mmol/L (ref 3.5–5.2)
Sodium: 134 mmol/L (ref 134–144)
Total Protein: 7 g/dL (ref 6.0–8.5)

## 2019-12-07 LAB — VITAMIN D 25 HYDROXY (VIT D DEFICIENCY, FRACTURES): Vit D, 25-Hydroxy: 33.7 ng/mL (ref 30.0–100.0)

## 2019-12-09 NOTE — Progress Notes (Signed)
Hello Pooja,  Your lab result is normal and/or stable.Some minor variations that are not significant are commonly marked abnormal, but do not represent any medical problem for you.  Best regards, Yamil Dougher, M.D.

## 2019-12-10 ENCOUNTER — Encounter: Payer: Self-pay | Admitting: Family Medicine

## 2019-12-14 ENCOUNTER — Other Ambulatory Visit (HOSPITAL_COMMUNITY): Payer: Self-pay | Admitting: *Deleted

## 2019-12-14 ENCOUNTER — Encounter (HOSPITAL_COMMUNITY): Payer: Self-pay | Admitting: *Deleted

## 2019-12-14 DIAGNOSIS — Z122 Encounter for screening for malignant neoplasm of respiratory organs: Secondary | ICD-10-CM

## 2019-12-14 DIAGNOSIS — Z87891 Personal history of nicotine dependence: Secondary | ICD-10-CM

## 2019-12-14 NOTE — Progress Notes (Signed)
Received referral for initial lung cancer screening scan from Dr. [name].  Contacted patients daughter who was present with her mother and obtained smoking history (started age 74, smoked 1 ppd until she quit in 2006, 42 pack year) as well as answering questions related to the screening process.  Patient denies signs/symptoms of lung cancer such as weight loss or hemoptysis.  Patient denies comorbidity that would prevent curative treatment if lung cancer were to be found.  Patient is scheduled for shared decision making visit and CT scan on 2/2 at 1030.

## 2019-12-14 NOTE — Progress Notes (Signed)
I attempted to call patient for screening to see if she meets criteria for the LDCT scan.  Her daughter is not with her mother right now (the patient) and she advised for me to call back at 2 pm so that I can speak with the patient.

## 2019-12-15 ENCOUNTER — Telehealth: Payer: Self-pay | Admitting: Family Medicine

## 2019-12-23 NOTE — Telephone Encounter (Signed)
Closing encounter. Several attempts to contact pt

## 2019-12-27 ENCOUNTER — Inpatient Hospital Stay (HOSPITAL_COMMUNITY): Payer: Medicare HMO | Attending: Nurse Practitioner | Admitting: Nurse Practitioner

## 2019-12-27 ENCOUNTER — Ambulatory Visit (HOSPITAL_COMMUNITY)
Admission: RE | Admit: 2019-12-27 | Discharge: 2019-12-27 | Disposition: A | Discharge status: Home / Self Care | Payer: Medicare HMO | Source: Ambulatory Visit | Attending: Nurse Practitioner | Admitting: Nurse Practitioner

## 2019-12-27 ENCOUNTER — Other Ambulatory Visit: Payer: Self-pay

## 2019-12-27 DIAGNOSIS — F17210 Nicotine dependence, cigarettes, uncomplicated: Secondary | ICD-10-CM | POA: Diagnosis not present

## 2019-12-27 DIAGNOSIS — Z122 Encounter for screening for malignant neoplasm of respiratory organs: Secondary | ICD-10-CM | POA: Insufficient documentation

## 2019-12-27 DIAGNOSIS — Z87891 Personal history of nicotine dependence: Secondary | ICD-10-CM | POA: Diagnosis not present

## 2020-01-02 ENCOUNTER — Other Ambulatory Visit: Payer: Self-pay | Admitting: Family Medicine

## 2020-01-02 ENCOUNTER — Telehealth: Payer: Self-pay | Admitting: Family Medicine

## 2020-01-02 MED ORDER — TRELEGY ELLIPTA 100-62.5-25 MCG/INH IN AEPB: 1.0000 | INHALATION_SPRAY | Freq: Every day | RESPIRATORY_TRACT | 11 refills | Status: DC

## 2020-01-02 NOTE — Telephone Encounter (Signed)
I sent in Trelegy as a replacement for the symbicort. Hopefully, since it is one puff a day instead of 4 it will not cause the problem with mouth sores. It is very important to rinse your mouth thoroughly with waqter or mouthwash after each use of any of these inhalers.

## 2020-01-03 NOTE — Telephone Encounter (Signed)
Patient aware.

## 2020-01-24 ENCOUNTER — Telehealth: Payer: Self-pay | Admitting: Family Medicine

## 2020-01-24 ENCOUNTER — Other Ambulatory Visit: Payer: Self-pay | Admitting: Family Medicine

## 2020-01-24 MED ORDER — IPRATROPIUM-ALBUTEROL 0.5-2.5 (3) MG/3ML IN SOLN: RESPIRATORY_TRACT | 0 refills | Status: DC

## 2020-01-24 NOTE — Telephone Encounter (Signed)
Please advise 

## 2020-01-24 NOTE — Telephone Encounter (Signed)
  Medication Request  01/24/2020  What is the name of the medication? Stileto Inhaler or Aerosphere Inhaler  Have you contacted your pharmacy to request a refill? No  Which pharmacy would you like this sent to? [facility]   Patient notified that their request is being sent to the clinical staff for review and that they should receive a call once it is complete. If they do not receive a call within 24 hours they can check with their pharmacy or our office.   Stacks' pt.  Please call pt.  Other meds were not covered thru Insurance.

## 2020-01-24 NOTE — Telephone Encounter (Signed)
I prescribed Trelegy for her.  That is the best option for her situation.  I do not recommend the Aerosphere or Stiolto for her.  The only reason I would make that choice for her is if it is the only thing her insurance will cover.  Trelegy and her DuoNeb should both be available at her pharmacy already.

## 2020-01-24 NOTE — Telephone Encounter (Signed)
Left message to please call our office. 

## 2020-01-25 ENCOUNTER — Other Ambulatory Visit: Payer: Self-pay | Admitting: Family Medicine

## 2020-01-25 MED ORDER — STIOLTO RESPIMAT 2.5-2.5 MCG/ACT IN AERS: 2.0000 | INHALATION_SPRAY | Freq: Every day | RESPIRATORY_TRACT | 11 refills | Status: DC

## 2020-01-25 NOTE — Telephone Encounter (Signed)
Patient aware and verbalized understanding. °

## 2020-01-25 NOTE — Telephone Encounter (Signed)
Patient brought a letter from her insurance company by the office. The letter states that the [facility] is a non-formulary drug and they will not cover it.  The only drugs they will cover are their preferred drugs which are Stiolto Respimat Solution and Bevespi Aerosphere HFA aerosol inhaler.  Please call patient and let her know what you are prescribing.

## 2020-01-25 NOTE — Telephone Encounter (Signed)
I sent in stiolto. Please let her know. Thanks, WS

## 2020-02-08 DIAGNOSIS — H5200 Hypermetropia, unspecified eye: Secondary | ICD-10-CM | POA: Diagnosis not present

## 2020-02-08 DIAGNOSIS — H25813 Combined forms of age-related cataract, bilateral: Secondary | ICD-10-CM | POA: Diagnosis not present

## 2020-02-08 DIAGNOSIS — E7800 Pure hypercholesterolemia, unspecified: Secondary | ICD-10-CM | POA: Diagnosis not present

## 2020-02-08 DIAGNOSIS — Z0100 Encounter for examination of eyes and vision without abnormal findings: Secondary | ICD-10-CM | POA: Diagnosis not present

## 2020-02-13 ENCOUNTER — Encounter (HOSPITAL_COMMUNITY): Payer: Self-pay | Admitting: *Deleted

## 2020-02-13 NOTE — Progress Notes (Signed)
Patient did not show for her LDCT shared-decision making visit.  I attempted to contact her today to reschedule.  I had to leave a message.  I asked that she call me back to reschedule.

## 2020-02-14 ENCOUNTER — Encounter (HOSPITAL_COMMUNITY): Payer: Self-pay | Admitting: *Deleted

## 2020-02-14 NOTE — Progress Notes (Signed)
Patient was here last month for LDCT.  She was supposed to come for shared-decision making visit but did not show.  She did have her scan.  It was just brought to our attention that patient had her scan.  I called patient today and spoke with her daughter Babette Relic.  I advised her of the results and she noted that Dr. Darlyn Read has already reviewed these results with her.  I advised that we will no longer be calling her for the scans as the radiologist does not recommend further scanning for her, due to her lung condition.  Tammy verbalizes understanding.    IMPRESSION: 1.  Lung-RADS 2, benign appearance or behavior. 2. Constellation of findings, most consistent with chronic atypical infection such as mycobacterium avium intracellular. These are relatively similar to the comparison of 02/05/2014. These chronic findings make the patient a poor candidate for lung cancer screening CT. No dominant superimposed lung mass is seen. 3. Aortic atherosclerosis (ICD10-I70.0), coronary artery atherosclerosis and emphysema (ICD10-J43.9). 4. Esophageal air fluid level suggests dysmotility or gastroesophageal reflux.

## 2020-03-18 ENCOUNTER — Other Ambulatory Visit: Payer: Self-pay | Admitting: Family Medicine

## 2020-03-18 DIAGNOSIS — J411 Mucopurulent chronic bronchitis: Secondary | ICD-10-CM

## 2020-03-23 ENCOUNTER — Encounter: Payer: Self-pay | Admitting: Family

## 2020-03-23 ENCOUNTER — Other Ambulatory Visit: Payer: Self-pay

## 2020-03-23 ENCOUNTER — Ambulatory Visit (INDEPENDENT_AMBULATORY_CARE_PROVIDER_SITE_OTHER): Payer: Medicare HMO | Admitting: Family

## 2020-03-23 VITALS — BP 149/79 | HR 100 | Temp 97.9°F | Ht 62.0 in | Wt 137.0 lb

## 2020-03-23 DIAGNOSIS — R300 Dysuria: Secondary | ICD-10-CM

## 2020-03-23 DIAGNOSIS — N3001 Acute cystitis with hematuria: Secondary | ICD-10-CM

## 2020-03-23 LAB — URINALYSIS, COMPLETE
Bilirubin, UA: NEGATIVE
Glucose, UA: NEGATIVE
Ketones, UA: NEGATIVE
Nitrite, UA: NEGATIVE
Protein,UA: NEGATIVE
Specific Gravity, UA: 1.01 (ref 1.005–1.030)
Urobilinogen, Ur: 0.2 mg/dL (ref 0.2–1.0)
pH, UA: 6.5 (ref 5.0–7.5)

## 2020-03-23 LAB — MICROSCOPIC EXAMINATION: WBC, UA: >30 /hpf — AB (ref 0–5)

## 2020-03-23 MED ORDER — CEFTRIAXONE SODIUM 1 G IJ SOLR
1.0000 g | Freq: Once | INTRAMUSCULAR | Status: AC
  Administered 2020-03-23: 1 g via INTRAMUSCULAR

## 2020-03-23 MED ORDER — CEPHALEXIN 500 MG PO CAPS: 500.0000 mg | ORAL_CAPSULE | Freq: Two times a day (BID) | ORAL | 0 refills | Status: DC

## 2020-03-23 NOTE — Patient Instructions (Signed)

## 2020-03-23 NOTE — Progress Notes (Signed)
   Subjective:    Patient ID: Catherine Harrell, female    DOB: 10-26-1946, 74 y.o.   MRN: [id number]  No chief complaint on file.   Flank Pain This is a new problem. The current episode started 1 to 4 weeks ago. The problem occurs constantly. The problem is unchanged. The pain is at a severity of 8/10. The pain is moderate. Pertinent negatives include no dysuria. (Urinary frequency urgency) She has tried nothing for the symptoms. The treatment provided no relief.      Review of Systems  Genitourinary: Positive for flank pain. Negative for dysuria.  All other systems reviewed and are negative.      Objective:   Physical Exam Vitals reviewed.  Constitutional:      General: She is not in acute distress.    Appearance: She is well-developed.  HENT:     Head: Normocephalic and atraumatic.     Right Ear: Tympanic membrane normal.     Left Ear: Tympanic membrane normal.  Eyes:     Pupils: Pupils are equal, round, and reactive to light.  Neck:     Thyroid: No thyromegaly.  Cardiovascular:     Rate and Rhythm: Normal rate and regular rhythm.     Heart sounds: Normal heart sounds. No murmur.  Pulmonary:     Effort: Pulmonary effort is normal. No respiratory distress.     Breath sounds: Normal breath sounds. No wheezing.  Abdominal:     General: Bowel sounds are normal. There is no distension.     Palpations: Abdomen is soft.     Tenderness: There is no abdominal tenderness (lower). There is right CVA tenderness.  Musculoskeletal:        General: No tenderness. Normal range of motion.     Cervical back: Normal range of motion and neck supple.  Skin:    General: Skin is warm and dry.  Neurological:     Mental Status: She is alert and oriented to person, place, and time.     Cranial Nerves: No cranial nerve deficit.     Deep Tendon Reflexes: Reflexes are normal and symmetric.  Psychiatric:        Behavior: Behavior normal.        Thought Content: Thought content normal.         Judgment: Judgment normal.     BP (!) 149/79   Pulse 100   Temp 97.9 F (36.6 C)   Ht 5\' 2"  (1.575 m)   Wt 137 lb (62.1 kg)   SpO2 95%   BMI 25.06 kg/m        Assessment & Plan:  [name] comes in today with chief complaint of Flank Pain   Diagnosis and orders addressed:  1. Acute cystitis with hematuria Force fluids RTO if symptoms worsen or do not improve  Culture pending - Urinalysis, Complete - Urine Culture - cephALEXin (KEFLEX) 500 MG capsule; Take 1 capsule (500 mg total) by mouth 2 (two) times daily.  Dispense: 14 capsule; Refill: 0 - cefTRIAXone (ROCEPHIN) injection 1 g  2. Dysuria  - Urinalysis, Complete   Mikey Kirschner, FNP

## 2020-03-26 ENCOUNTER — Telehealth: Payer: Self-pay | Admitting: Family

## 2020-03-26 ENCOUNTER — Other Ambulatory Visit: Payer: Self-pay | Admitting: Family Medicine

## 2020-03-26 MED ORDER — AMOXICILLIN 500 MG PO CAPS: 500.0000 mg | ORAL_CAPSULE | Freq: Three times a day (TID) | ORAL | 0 refills | Status: DC

## 2020-03-26 NOTE — Telephone Encounter (Signed)
Lmtcb.

## 2020-03-26 NOTE — Telephone Encounter (Signed)
Discontinue the cephalexin. I sent in amoxil. If her symptoms continue she will need a CT to check for kidney stone.

## 2020-03-26 NOTE — Telephone Encounter (Signed)
Daughter states that patient was started on Keflx and it is making her have N/V, diahrea and trouble sleeping. States that she would like it changed to something else.  Please call daughter Babette Relic back at [phone]. Please advise

## 2020-03-27 ENCOUNTER — Other Ambulatory Visit: Payer: Self-pay | Admitting: Family

## 2020-03-27 LAB — URINE CULTURE

## 2020-03-29 NOTE — Telephone Encounter (Signed)
Daughter stated they were already aware

## 2020-04-24 ENCOUNTER — Other Ambulatory Visit: Payer: Self-pay | Admitting: Family Medicine

## 2020-04-24 DIAGNOSIS — J411 Mucopurulent chronic bronchitis: Secondary | ICD-10-CM

## 2020-05-02 ENCOUNTER — Other Ambulatory Visit: Payer: Self-pay

## 2020-05-02 ENCOUNTER — Ambulatory Visit (INDEPENDENT_AMBULATORY_CARE_PROVIDER_SITE_OTHER): Payer: Medicare HMO | Admitting: Family Medicine

## 2020-05-02 ENCOUNTER — Other Ambulatory Visit: Payer: Self-pay | Admitting: Family Medicine

## 2020-05-02 ENCOUNTER — Encounter: Payer: Self-pay | Admitting: Family Medicine

## 2020-05-02 VITALS — BP 151/86 | HR 98 | Temp 97.1°F | Resp 20 | Ht 62.0 in | Wt 138.0 lb

## 2020-05-02 DIAGNOSIS — H9113 Presbycusis, bilateral: Secondary | ICD-10-CM | POA: Diagnosis not present

## 2020-05-02 DIAGNOSIS — I10 Essential (primary) hypertension: Secondary | ICD-10-CM

## 2020-05-02 DIAGNOSIS — J411 Mucopurulent chronic bronchitis: Secondary | ICD-10-CM

## 2020-05-02 DIAGNOSIS — J441 Chronic obstructive pulmonary disease with (acute) exacerbation: Secondary | ICD-10-CM

## 2020-05-02 MED ORDER — LEVOFLOXACIN 500 MG PO TABS
500.0000 mg | ORAL_TABLET | Freq: Every day | ORAL | 0 refills | Status: DC
Start: 2020-05-02 — End: 2020-10-09

## 2020-05-02 MED ORDER — GABAPENTIN 300 MG PO CAPS: 900.0000 mg | ORAL_CAPSULE | Freq: Every day | ORAL | 1 refills | Status: DC

## 2020-05-02 MED ORDER — PREDNISONE 5 MG PO TABS: 15.0000 mg | ORAL_TABLET | Freq: Every day | ORAL | 2 refills | Status: DC

## 2020-05-02 MED ORDER — BETAMETHASONE SOD PHOS & ACET 6 (3-3) MG/ML IJ SUSP
12.0000 mg | Freq: Once | INTRAMUSCULAR | Status: AC
  Administered 2020-05-02: 12 mg via INTRAMUSCULAR

## 2020-05-02 NOTE — Progress Notes (Signed)
Subjective:  Patient ID: Catherine Harrell, female    DOB: 1946/02/11  Age: 74 y.o. MRN: [id number]  CC: Medication Refill   HPI Catherine Harrell presents for feeling short of breath for the last 2 days with cough associated with it.  She has had no fever chills or sweats.  The cough is productive of sputum green sputum.  She has a history of COPD.  She is also very hard of hearing.  Today she also complains of some back pain in the left buttock region.  Depression screen [facility] 2/9 05/02/2020 12/06/2019 08/18/2019  Decreased Interest 0 0 0  Down, Depressed, Hopeless 0 0 0  PHQ - 2 Score 0 0 0    History Catherine Harrell has a past medical history of COPD (chronic obstructive pulmonary disease) ([place]), Hypertension, Osteoporosis, and TMJ arthralgia.   She has no past surgical history on file.   Her family history includes COPD in her father and sister; Cancer in her daughter and son; Diabetes in her mother and sister; Healthy in her son and son; Heart attack in her sister; Heart disease in her brother, father, and sister; Hypertension in her mother; Kidney disease in her daughter.She reports that she quit smoking about 15 years ago. Her smoking use included cigarettes. She has a 30.00 pack-year smoking history. She has never used smokeless tobacco. She reports that she does not drink alcohol and does not use drugs.    ROS Review of Systems  Constitutional: Negative.   HENT: Negative.   Eyes: Negative for visual disturbance.  Respiratory: Positive for cough and shortness of breath.   Cardiovascular: Negative for chest pain.  Gastrointestinal: Negative for abdominal pain.  Musculoskeletal: Positive for arthralgias and back pain.    Objective:  BP (!) 151/86   Pulse 98   Temp (!) 97.1 F (36.2 C) (Temporal)   Resp 20   Ht 5\' 2"  (1.575 m)   Wt 138 lb (62.6 kg)   SpO2 94%   BMI 25.24 kg/m   BP Readings from Last 3 Encounters:  05/02/20 (!) 151/86  03/23/20 (!) 149/79   12/06/19 134/75    Wt Readings from Last 3 Encounters:  05/02/20 138 lb (62.6 kg)  03/23/20 137 lb (62.1 kg)  12/06/19 136 lb 12.8 oz (62.1 kg)     Physical Exam Constitutional:      General: She is not in acute distress.    Appearance: She is well-developed.  HENT:     Head: Normocephalic and atraumatic.     Right Ear: Tympanic membrane normal.     Left Ear: Tympanic membrane normal.     Ears:     Comments: Very hard of hearing in spite of use of hearing aids Eyes:     Conjunctiva/sclera: Conjunctivae normal.     Pupils: Pupils are equal, round, and reactive to light.  Neck:     Thyroid: No thyromegaly.  Cardiovascular:     Rate and Rhythm: Normal rate and regular rhythm.     Heart sounds: Normal heart sounds. No murmur heard.   Pulmonary:     Effort: Pulmonary effort is normal. No respiratory distress.     Breath sounds: Examination of the right-middle field reveals wheezing. Examination of the left-middle field reveals wheezing. Decreased breath sounds and wheezing present. No rales.  Abdominal:     General: Bowel sounds are normal. There is no distension.     Palpations: Abdomen is soft.     Tenderness: There is no abdominal  tenderness.  Musculoskeletal:        General: Normal range of motion.     Cervical back: Normal range of motion and neck supple.  Lymphadenopathy:     Cervical: No cervical adenopathy.  Skin:    General: Skin is warm and dry.  Neurological:     Mental Status: She is alert and oriented to person, place, and time.  Psychiatric:        Behavior: Behavior normal.        Thought Content: Thought content normal.        Judgment: Judgment normal.       Assessment & Plan:   Raja was seen today for medication refill.  Diagnoses and all orders for this visit:  Mucopurulent chronic bronchitis (HCC) -     betamethasone acetate-betamethasone sodium phosphate (CELESTONE) injection 12 mg -     predniSONE (DELTASONE) 5 MG tablet; Take 3  tablets (15 mg total) by mouth daily with breakfast.  Presbycusis of both ears  Essential hypertension  Chronic obstructive pulmonary disease with acute exacerbation (HCC)  Other orders -     levofloxacin (LEVAQUIN) 500 MG tablet; Take 1 tablet (500 mg total) by mouth daily. -     gabapentin (NEURONTIN) 300 MG capsule; Take 3 capsules (900 mg total) by mouth at bedtime.       I have changed Catherine Harrell's gabapentin and predniSONE. I am also having her start on levofloxacin. Additionally, I am having her maintain her losartan and Stiolto Respimat. We administered betamethasone acetate-betamethasone sodium phosphate.  Allergies as of 05/02/2020      Reactions   Levofloxacin       Medication List       Accurate as of May 02, 2020 11:59 PM. If you have any questions, ask your nurse or doctor.        gabapentin 300 MG capsule Commonly known as: NEURONTIN Take 3 capsules (900 mg total) by mouth at bedtime. What changed: how much to take Changed by: [name], MD   ipratropium-albuterol 0.5-2.5 (3) MG/3ML Soln Commonly known as: DUONEB INHALE CONTENTS OF 1 VIAL VIA NEBULIZER EVERY 4 HOURS AS NEEDED What changed: additional instructions Changed by: [name], MD   levofloxacin 500 MG tablet Commonly known as: LEVAQUIN Take 1 tablet (500 mg total) by mouth daily. Started by: [name], MD   losartan 25 MG tablet Commonly known as: COZAAR Take 1 tablet (25 mg total) by mouth daily.   predniSONE 5 MG tablet Commonly known as: DELTASONE Take 3 tablets (15 mg total) by mouth daily with breakfast.   Stiolto Respimat 2.5-2.5 MCG/ACT Aers Generic drug: Tiotropium Bromide-Olodaterol Inhale 2 puffs into the lungs daily.        Follow-up: No follow-ups on file.  [name], M.D.

## 2020-05-03 ENCOUNTER — Telehealth: Payer: Self-pay | Admitting: Family Medicine

## 2020-05-03 NOTE — Telephone Encounter (Signed)
Pt called to let Dr Darlyn Read know that she has stopped taking the Levofloxacin Rx because it makes her hurt. Pt says she does not need alternate medicine.

## 2020-05-03 NOTE — Telephone Encounter (Signed)
FYI for provider

## 2020-05-26 ENCOUNTER — Other Ambulatory Visit: Payer: Self-pay | Admitting: Family Medicine

## 2020-06-07 ENCOUNTER — Other Ambulatory Visit: Payer: Self-pay | Admitting: Family Medicine

## 2020-07-19 ENCOUNTER — Other Ambulatory Visit: Payer: Self-pay | Admitting: Family Medicine

## 2020-07-19 DIAGNOSIS — J411 Mucopurulent chronic bronchitis: Secondary | ICD-10-CM

## 2020-08-19 ENCOUNTER — Other Ambulatory Visit: Payer: Self-pay | Admitting: Family Medicine

## 2020-08-19 DIAGNOSIS — J411 Mucopurulent chronic bronchitis: Secondary | ICD-10-CM

## 2020-09-03 ENCOUNTER — Other Ambulatory Visit: Payer: Self-pay | Admitting: Family Medicine

## 2020-09-03 ENCOUNTER — Other Ambulatory Visit: Payer: Self-pay

## 2020-09-03 ENCOUNTER — Encounter (INDEPENDENT_AMBULATORY_CARE_PROVIDER_SITE_OTHER): Payer: Medicare HMO | Admitting: Family Medicine

## 2020-09-03 DIAGNOSIS — J411 Mucopurulent chronic bronchitis: Secondary | ICD-10-CM

## 2020-09-03 NOTE — Progress Notes (Signed)
No answer at home. Mobile has been disconnected.

## 2020-09-09 ENCOUNTER — Encounter: Payer: Self-pay | Admitting: Family Medicine

## 2020-09-10 ENCOUNTER — Other Ambulatory Visit: Payer: Self-pay | Admitting: Family Medicine

## 2020-09-10 DIAGNOSIS — J411 Mucopurulent chronic bronchitis: Secondary | ICD-10-CM

## 2020-09-12 ENCOUNTER — Other Ambulatory Visit: Payer: Self-pay

## 2020-09-12 DIAGNOSIS — J411 Mucopurulent chronic bronchitis: Secondary | ICD-10-CM

## 2020-09-12 MED ORDER — PREDNISONE 5 MG PO TABS: 15.0000 mg | ORAL_TABLET | Freq: Every day | ORAL | 0 refills | Status: DC

## 2020-09-12 NOTE — Telephone Encounter (Signed)
.   Prescription Request  09/12/2020  What is the name of the medication or equipment? predniSONE (DELTASONE) 5 MG tablet   Have you contacted your pharmacy to request a refill? (if applicable) yes  Which pharmacy would you like this sent to? [facility]    Patient notified that their request is being sent to the clinical staff for review and that they should receive a response within 2 business days.

## 2020-09-17 ENCOUNTER — Telehealth: Payer: Self-pay | Admitting: Family Medicine

## 2020-09-17 NOTE — Telephone Encounter (Signed)
Pt wants to know if something can be ordered for her to check her oxygen levels at night or if she can be referred where to get something to do so, states that she feels like it is changing during the night

## 2020-09-18 NOTE — Telephone Encounter (Signed)
[employer] will only cover a pulse ox if you use oxygen at home. You can buy them from [facility] or [employer]

## 2020-09-18 NOTE — Telephone Encounter (Signed)
Patient aware and verbalized understanding. °

## 2020-09-24 ENCOUNTER — Encounter (HOSPITAL_COMMUNITY): Payer: Self-pay | Admitting: *Deleted

## 2020-09-24 ENCOUNTER — Emergency Department (HOSPITAL_COMMUNITY)
Admission: EM | Admit: 2020-09-24 | Discharge: 2020-09-24 | Disposition: A | Discharge status: Home / Self Care | Payer: Medicare HMO | Attending: Emergency Medicine | Admitting: Emergency Medicine

## 2020-09-24 ENCOUNTER — Other Ambulatory Visit: Payer: Self-pay

## 2020-09-24 ENCOUNTER — Emergency Department (HOSPITAL_COMMUNITY): Payer: Medicare HMO

## 2020-09-24 DIAGNOSIS — J22 Unspecified acute lower respiratory infection: Secondary | ICD-10-CM | POA: Diagnosis not present

## 2020-09-24 DIAGNOSIS — Z79899 Other long term (current) drug therapy: Secondary | ICD-10-CM | POA: Diagnosis not present

## 2020-09-24 DIAGNOSIS — R059 Cough, unspecified: Secondary | ICD-10-CM | POA: Diagnosis not present

## 2020-09-24 DIAGNOSIS — Z7951 Long term (current) use of inhaled steroids: Secondary | ICD-10-CM | POA: Diagnosis not present

## 2020-09-24 DIAGNOSIS — J90 Pleural effusion, not elsewhere classified: Secondary | ICD-10-CM | POA: Diagnosis not present

## 2020-09-24 DIAGNOSIS — M47816 Spondylosis without myelopathy or radiculopathy, lumbar region: Secondary | ICD-10-CM | POA: Diagnosis not present

## 2020-09-24 DIAGNOSIS — I10 Essential (primary) hypertension: Secondary | ICD-10-CM | POA: Insufficient documentation

## 2020-09-24 DIAGNOSIS — S3992XA Unspecified injury of lower back, initial encounter: Secondary | ICD-10-CM | POA: Diagnosis present

## 2020-09-24 DIAGNOSIS — R918 Other nonspecific abnormal finding of lung field: Secondary | ICD-10-CM | POA: Diagnosis not present

## 2020-09-24 DIAGNOSIS — J441 Chronic obstructive pulmonary disease with (acute) exacerbation: Secondary | ICD-10-CM | POA: Insufficient documentation

## 2020-09-24 DIAGNOSIS — S32010A Wedge compression fracture of first lumbar vertebra, initial encounter for closed fracture: Secondary | ICD-10-CM

## 2020-09-24 DIAGNOSIS — Z20822 Contact with and (suspected) exposure to covid-19: Secondary | ICD-10-CM | POA: Insufficient documentation

## 2020-09-24 DIAGNOSIS — R000 Tachycardia, unspecified: Secondary | ICD-10-CM | POA: Diagnosis not present

## 2020-09-24 DIAGNOSIS — R3911 Hesitancy of micturition: Secondary | ICD-10-CM | POA: Diagnosis not present

## 2020-09-24 DIAGNOSIS — M545 Low back pain, unspecified: Secondary | ICD-10-CM | POA: Diagnosis not present

## 2020-09-24 DIAGNOSIS — R0602 Shortness of breath: Secondary | ICD-10-CM | POA: Diagnosis not present

## 2020-09-24 DIAGNOSIS — R0989 Other specified symptoms and signs involving the circulatory and respiratory systems: Secondary | ICD-10-CM | POA: Diagnosis not present

## 2020-09-24 DIAGNOSIS — X58XXXA Exposure to other specified factors, initial encounter: Secondary | ICD-10-CM | POA: Insufficient documentation

## 2020-09-24 DIAGNOSIS — Z87891 Personal history of nicotine dependence: Secondary | ICD-10-CM | POA: Insufficient documentation

## 2020-09-24 DIAGNOSIS — R7981 Abnormal blood-gas level: Secondary | ICD-10-CM | POA: Diagnosis not present

## 2020-09-24 DIAGNOSIS — M4306 Spondylolysis, lumbar region: Secondary | ICD-10-CM | POA: Diagnosis not present

## 2020-09-24 LAB — URINALYSIS, ROUTINE W REFLEX MICROSCOPIC
Bacteria, UA: NONE SEEN
Bilirubin Urine: NEGATIVE
Glucose, UA: NEGATIVE mg/dL
Ketones, ur: NEGATIVE mg/dL
Leukocytes,Ua: NEGATIVE
Nitrite: NEGATIVE
Protein, ur: NEGATIVE mg/dL
Specific Gravity, Urine: 1.006 (ref 1.005–1.030)
pH: 6 (ref 5.0–8.0)

## 2020-09-24 LAB — CBC WITH DIFFERENTIAL/PLATELET
Abs Immature Granulocytes: 0.08 10*3/uL — ABNORMAL HIGH (ref 0.00–0.07)
Basophils Absolute: 0 10*3/uL (ref 0.0–0.1)
Basophils Relative: 0 %
Eosinophils Absolute: 0 10*3/uL (ref 0.0–0.5)
Eosinophils Relative: 0 %
HCT: 46.3 % — ABNORMAL HIGH (ref 36.0–46.0)
Hemoglobin: 14.3 g/dL (ref 12.0–15.0)
Immature Granulocytes: 1 %
Lymphocytes Relative: 4 %
Lymphs Abs: 0.6 10*3/uL — ABNORMAL LOW (ref 0.7–4.0)
MCH: 29.7 pg (ref 26.0–34.0)
MCHC: 30.9 g/dL (ref 30.0–36.0)
MCV: 96.1 fL (ref 80.0–100.0)
Monocytes Absolute: 0.3 10*3/uL (ref 0.1–1.0)
Monocytes Relative: 3 %
Neutro Abs: 12.2 10*3/uL — ABNORMAL HIGH (ref 1.7–7.7)
Neutrophils Relative %: 92 %
Platelets: 407 10*3/uL — ABNORMAL HIGH (ref 150–400)
RBC: 4.82 MIL/uL (ref 3.87–5.11)
RDW: 12.6 % (ref 11.5–15.5)
WBC: 13.2 10*3/uL — ABNORMAL HIGH (ref 4.0–10.5)
nRBC: 0 % (ref 0.0–0.2)

## 2020-09-24 LAB — COMPREHENSIVE METABOLIC PANEL
ALT: 14 U/L (ref 0–44)
AST: 15 U/L (ref 15–41)
Albumin: 3.7 g/dL (ref 3.5–5.0)
Alkaline Phosphatase: 63 U/L (ref 38–126)
Anion gap: 9 (ref 5–15)
BUN: 12 mg/dL (ref 8–23)
CO2: 27 mmol/L (ref 22–32)
Calcium: 9.7 mg/dL (ref 8.9–10.3)
Chloride: 96 mmol/L — ABNORMAL LOW (ref 98–111)
Creatinine, Ser: 0.92 mg/dL (ref 0.44–1.00)
GFR, Estimated: >60 mL/min (ref >60)
Glucose, Bld: 124 mg/dL — ABNORMAL HIGH (ref 70–99)
Potassium: 4.2 mmol/L (ref 3.5–5.1)
Sodium: 132 mmol/L — ABNORMAL LOW (ref 135–145)
Total Bilirubin: 0.5 mg/dL (ref 0.3–1.2)
Total Protein: 7.5 g/dL (ref 6.5–8.1)

## 2020-09-24 LAB — RESPIRATORY PANEL BY RT PCR (FLU A&B, COVID)
Influenza A by PCR: NEGATIVE
Influenza B by PCR: NEGATIVE
SARS Coronavirus 2 by RT PCR: NEGATIVE

## 2020-09-24 LAB — LIPASE, BLOOD: Lipase: 41 U/L (ref 11–51)

## 2020-09-24 MED ORDER — METHYLPREDNISOLONE SODIUM SUCC 125 MG IJ SOLR
125.0000 mg | Freq: Once | INTRAMUSCULAR | Status: AC
  Administered 2020-09-24: 125 mg via INTRAVENOUS
  Filled 2020-09-24: qty 2

## 2020-09-24 MED ORDER — ALBUTEROL SULFATE (2.5 MG/3ML) 0.083% IN NEBU
5.0000 mg | INHALATION_SOLUTION | Freq: Once | RESPIRATORY_TRACT | Status: AC
  Administered 2020-09-24: 5 mg via RESPIRATORY_TRACT
  Filled 2020-09-24: qty 6

## 2020-09-24 MED ORDER — TRAMADOL-ACETAMINOPHEN 37.5-325 MG PO TABS: 1.0000 | ORAL_TABLET | Freq: Four times a day (QID) | ORAL | 0 refills | Status: DC | PRN

## 2020-09-24 MED ORDER — MAGNESIUM SULFATE 2 GM/50ML IV SOLN
2.0000 g | Freq: Once | INTRAVENOUS | Status: AC
  Administered 2020-09-24: 2 g via INTRAVENOUS
  Filled 2020-09-24: qty 50

## 2020-09-24 NOTE — ED Notes (Signed)
Pt was informed that we need a urine sample. 

## 2020-09-24 NOTE — ED Notes (Signed)
Pt ambulatory to bathroom on RA. Sats on return 88% and pt RR 35-40. 2L [place] applied. Will  Continue to monitor

## 2020-09-24 NOTE — ED Triage Notes (Signed)
Short of breath, history of COPD 

## 2020-09-24 NOTE — Discharge Instructions (Signed)
The testing today indicates that you have a COPD exacerbation, which we are treating with prednisone.  Continue using your nebulizer to help you breathe.  The x-ray of your back did show an L1 and L3 compression fracture both of which are mild, I can be treated symptomatically at this time.  It will be helpful to follow with your PCP about both the breathing problem and the back pain problem.  The x-ray of your back did show arthritis as well.  We are prescribing a narcotic pain reliever to use, so be careful when you are taking it, and avoid standing if you feel dizzy.  Sometimes compression fractures can cause constipation, and narcotic pain pills can make that worse.  If you feel constipated use a stool softener like Colace to help you have a bowel movement.  Using heat on your back can be helpful as well.

## 2020-09-24 NOTE — ED Provider Notes (Signed)
[facility] EMERGENCY DEPARTMENT Provider Note   CSN: [id number] Arrival date & time: 09/24/20  1530     History Chief Complaint  Patient presents with  . Shortness of Breath    Catherine Harrell is a 74 y.o. female.  HPI Patient here for evaluation of shortness of breath, and low back pain.  No fever, cough, focal weakness or paresthesia.  No recent trauma, to account for back pain injury.  She is using her usual medications at home without relief.  She was seen at an urgent care today, and referred to the ED for further evaluation because of low oxygen saturation.  Documentation available through this EMR does not indicate what level of oxygen desaturation she had.  She came here for evaluation by private vehicle.  She did not require immediate intervention for respiratory distress on arrival.  There are no other known modifying factors.    Past Medical History:  Diagnosis Date  . COPD (chronic obstructive pulmonary disease) ([place])   . Hypertension   . Osteoporosis   . TMJ arthralgia    left    Patient Active Problem List   Diagnosis Date Noted  . Presbycusis of both ears 12/06/2019  . Osteoporosis 06/09/2017  . Essential hypertension 08/07/2014  . Chronic obstructive pulmonary disease ([employer]) 01/16/2014    History reviewed. No pertinent surgical history.   OB History   No obstetric history on file.     Family History  Problem Relation Age of Onset  . Heart disease Father   . COPD Father   . Heart disease Sister   . Heart attack Sister   . Diabetes Sister   . COPD Sister   . Heart disease Brother   . Cancer Daughter        breast  . Hypertension Mother   . Diabetes Mother   . Healthy Son   . Cancer Son        Prostate   . Kidney disease Daughter   . Healthy Son     Social History   Tobacco Use  . Smoking status: Former Smoker    Packs/day: 1.00    Years: 30.00    Pack years: 30.00    Types: Cigarettes    Quit date: 01/16/2005    Years  since quitting: 15.6  . Smokeless tobacco: Never Used  Vaping Use  . Vaping Use: Never used  Substance Use Topics  . Alcohol use: No  . Drug use: No    Home Medications Prior to Admission medications   Medication Sig Start Date End Date Taking? Authorizing Provider  Calcium Carbonate-Vit D-Min (CALCIUM 1200 PO) Take 1 tablet by mouth daily.   Yes [provider]  cholecalciferol (VITAMIN D3) 25 MCG (1000 UNIT) tablet Take 1,000 Units by mouth daily.   Yes [provider]  gabapentin (NEURONTIN) 300 MG capsule Take 3 capsules (900 mg total) by mouth at bedtime. 05/02/20  Yes Stacks, Cletus Gash, MD  ipratropium-albuterol (DUONEB) 0.5-2.5 (3) MG/3ML SOLN INHALE CONTENTS OF 1 VIAL VIA NEBULIZER EVERY 4 HOURS AS NEEDED Patient taking differently: 3 mLs every 4 (four) hours as needed. INHALE CONTENTS OF 1 VIAL VIA NEBULIZER EVERY 4 HOURS AS NEEDED 05/29/20  Yes Stacks, Cletus Gash, MD  losartan (COZAAR) 25 MG tablet TAKE 1 TABLET BY MOUTH EVERY DAY 06/07/20  Yes Claretta Fraise, MD  predniSONE (DELTASONE) 5 MG tablet Take 3 tablets (15 mg total) by mouth daily with breakfast. 09/12/20  Yes Claretta Fraise, MD  fluticasone furoate-vilanterol (  BREO ELLIPTA) 100-25 MCG/INH AEPB Inhale into the lungs. Patient not taking: Reported on 09/24/2020 07/25/14   [provider]  levofloxacin (LEVAQUIN) 500 MG tablet Take 1 tablet (500 mg total) by mouth daily. Patient not taking: Reported on 09/24/2020 05/02/20   Claretta Fraise, MD  Tiotropium Bromide-Olodaterol (STIOLTO RESPIMAT) 2.5-2.5 MCG/ACT AERS Inhale 2 puffs into the lungs daily. 01/25/20   Claretta Fraise, MD  traMADol-acetaminophen (ULTRACET) 37.5-325 MG tablet Take 1 tablet by mouth every 6 (six) hours as needed for moderate pain or severe pain. 09/24/20   Daleen Bo, MD    Allergies    Levofloxacin  Review of Systems   Review of Systems  All other systems reviewed and are negative.   Physical Exam Updated Vital Signs BP (!) 156/86    Pulse (!) 102   Temp 99.6 F (37.6 C) (Rectal)   Resp (!) 38   Ht _0  (1.651 m)   Wt 63.5 kg   SpO2 100%   BMI 23.30 kg/m   Physical Exam Vitals and nursing note reviewed.  Constitutional:      General: She is not in acute distress.    Appearance: She is well-developed. She is not ill-appearing or toxic-appearing.  HENT:     Head: Normocephalic and atraumatic.     Right Ear: External ear normal.     Left Ear: External ear normal.     Nose: No congestion or rhinorrhea.  Eyes:     Conjunctiva/sclera: Conjunctivae normal.     Pupils: Pupils are equal, round, and reactive to light.  Neck:     Trachea: Phonation normal.  Cardiovascular:     Rate and Rhythm: Normal rate and regular rhythm.     Heart sounds: Normal heart sounds.  Pulmonary:     Effort: Pulmonary effort is normal.     Comments: Decreased air movement, bilaterally with scattered rhonchi but no wheezes.  No increased work of breathing. Abdominal:     General: There is no distension.     Palpations: Abdomen is soft.     Tenderness: There is no abdominal tenderness.  Musculoskeletal:        General: Normal range of motion.     Cervical back: Normal range of motion and neck supple.  Skin:    General: Skin is warm and dry.  Neurological:     Mental Status: She is alert and oriented to person, place, and time.     Cranial Nerves: No cranial nerve deficit.     Sensory: No sensory deficit.     Motor: No abnormal muscle tone.     Coordination: Coordination normal.  Psychiatric:        Mood and Affect: Mood normal.        Behavior: Behavior normal.        Thought Content: Thought content normal.        Judgment: Judgment normal.     ED Results / Procedures / Treatments   Labs (all labs ordered are listed, but only abnormal results are displayed) Labs Reviewed  COMPREHENSIVE METABOLIC PANEL - Abnormal; Notable for the following components:      Result Value   Sodium 132 (*)    Chloride 96 (*)     Glucose, Bld 124 (*)    All other components within normal limits  CBC WITH DIFFERENTIAL/PLATELET - Abnormal; Notable for the following components:   WBC 13.2 (*)    HCT 46.3 (*)    Platelets 407 (*)    Neutro  Abs 12.2 (*)    Lymphs Abs 0.6 (*)    Abs Immature Granulocytes 0.08 (*)    All other components within normal limits  URINALYSIS, ROUTINE W REFLEX MICROSCOPIC - Abnormal; Notable for the following components:   Color, Urine STRAW (*)    Hgb urine dipstick SMALL (*)    All other components within normal limits  RESPIRATORY PANEL BY RT PCR (FLU A&B, COVID)  CULTURE, BLOOD (ROUTINE X 2)  CULTURE, BLOOD (ROUTINE X 2)  LIPASE, BLOOD    EKG EKG Interpretation  Date/Time:  Monday September 24 2020 16:08:02 EDT Ventricular Rate:  117 PR Interval:    QRS Duration: 99 QT Interval:  304 QTC Calculation: 425 R Axis:   29 Text Interpretation: Sinus tachycardia Probable left atrial enlargement Anterior infarct, old Minimal ST depression, inferior leads Baseline wander in lead(s) III aVF Artifact No old tracing to compare Confirmed by Daleen Bo [phone]) on 09/24/2020 4:14:32 PM   Radiology DG Lumbar Spine Complete  Result Date: 09/24/2020 CLINICAL DATA:  Lumbosacral back pain.  No known injury. EXAM: LUMBAR SPINE - COMPLETE 4+ VIEW COMPARISON:  Lumbar radiograph 04/30/2016, included portion from chest CT 12/27/2019. FINDINGS: L1 compression fracture with approximately 25% loss of height anteriorly. There is minimal loss of height of L3 superior endplate. Unchanged 5 mm anterolisthesis of L4 on L5. Endplate spurring at multiple levels with mild disc space narrowing at L2-L3 and L4-L5. Lower lumbar facet hypertrophy. Slight dextroscoliotic curvature of upper lumbar spine. Bones are diffusely under mineralized. The sacroiliac joints are congruent. IMPRESSION: 1. L1 compression fracture with approximately 25% loss of height anteriorly, age indeterminate but new February 2021 chest CT. 2.  Minimal loss of height of the L3 superior endplate, suspicious for compression fracture, age indeterminate. 3. Multilevel degenerative change with mild scoliosis, degenerative disc disease, and multilevel facet hypertrophy. Electronically Signed   By: Keith Rake M.D.   On: 09/24/2020 20:16   DG Chest Port 1 View  Result Date: 09/24/2020 CLINICAL DATA:  Short of breath since yesterday, lower back pain EXAM: PORTABLE CHEST 1 VIEW COMPARISON:  08/18/2019 FINDINGS: Single frontal view of the chest demonstrates a stable cardiac silhouette. Diffuse interstitial prominence and bilateral nodularity are stable, consistent with sequela of chronic postinflammatory change or chronic indolent infection such as MAC. No acute airspace disease, effusion, or pneumothorax. No acute bony abnormalities. IMPRESSION: 1. Chronic parenchymal scarring, most consistent with postinflammatory changes or chronic indolent infection as above. 2. No acute intrathoracic process. Electronically Signed   By: Randa Ngo M.D.   On: 09/24/2020 17:50    Procedures Procedures (including critical care time)  Medications Ordered in ED Medications  methylPREDNISolone sodium succinate (SOLU-MEDROL) 125 mg/2 mL injection 125 mg (125 mg Intravenous Given 09/24/20 1714)  magnesium sulfate IVPB 2 g 50 mL (0 g Intravenous Stopped 09/24/20 1833)  albuterol (PROVENTIL) (2.5 MG/3ML) 0.083% nebulizer solution 5 mg (5 mg Nebulization Given 09/24/20 1826)    ED Course  I have reviewed the triage vital signs and the nursing notes.  Pertinent labs & imaging results that were available during my care of the patient were reviewed by me and considered in my medical decision making (see chart for details).  Clinical Course as of Sep 24 2057  [facility] Sep 24, 2020  1630 Nasal cannula oxygen removed by me to monitor for desaturation.   [EW]  1913 Normal except white count high, platelets high  CBC with Differential(!) [EW]  1913 Normal except  hemoglobin present  Urinalysis, Routine w reflex microscopic Urine, Clean Catch(!) [EW]  1913 Normal  Lipase, blood [EW]  1913 Normal  Respiratory Panel by RT PCR (Flu A&B, Covid) - Nasopharyngeal Swab [EW]  1914 Normal except sodium low, glucose high  Comprehensive metabolic panel(!) [EW]  0102 No infiltrate or edema; Interpreted by me  DG Chest [facility] [EW]    Clinical Course User Index [EW] Daleen Bo, MD   MDM Rules/Calculators/A&P                           Patient Vitals for the past 24 hrs:  BP Temp Temp src Pulse Resp SpO2 Height Weight  09/24/20 2030 (!) 156/86 -- -- (!) 102 (!) 38 100 % -- --  09/24/20 1930 (!) 167/90 -- -- (!) 105 (!) 40 94 % -- --  09/24/20 1900 (!) 147/79 -- -- 95 (!) 36 93 % -- --  09/24/20 1826 -- -- -- -- -- 94 % -- --  09/24/20 1736 -- 99.6 F (37.6 C) Rectal -- -- -- -- --  09/24/20 1550 -- -- -- -- -- -- _0  (1.651 m) 63.5 kg  09/24/20 1548 (!) 152/90 97.6 F (36.4 C) Oral (!) 122 (!) 24 99 % -- --    8:44 PM Reevaluation with update and discussion. After initial assessment and treatment, an updated evaluation reveals no change in clinical status, she continues have oxygen saturation on room air which is 92% or greater.  No respiratory distress at this time.  Findings discussed with patient, daughter, all questions answered. Daleen Bo   Medical Decision Making:  This patient is presenting for evaluation of shortness of breath and back pain, which does require a range of treatment options, and is a complaint that involves a moderate risk of morbidity and mortality. The differential diagnoses include bacterial illness including pneumonia, viral illness, including pneumonia, metabolic disorder, back injury, occult. I decided to review old records, and in summary elderly female presenting with 2 problems, shortness of breath and back pain.  I did not require additional historical information from anyone.  Clinical Evaluation:Lipase,  Covid testLaboratory Tests Ordered, included CBC, Metabolic panel, Urinalysis and Lipase, Covid test. Review indicates reassuring findings, mild white count elevation and electrolyte abnormalities consistent with dehydration. Radiologic Tests Ordered, included x-ray chest, x-ray lumbar spine.  I independently Visualized: Radiographic images, which show no pneumonia or pulmonary complications; L1 and L3 compression fractures, mild, age-indeterminate with degenerative changes  Critical Interventions-clinical evaluation, laboratory testing, radiology imaging, observation reassessment  After These Interventions, the Patient was reevaluated and was found stable for discharge.  Patient's oxygen saturation stayed greater than 92% on room air.  Treated with prednisone, and ambulates in ED.  Covid test negative.  Lumbar imaging indicates compression fractures, L1 and L3, age-indeterminate.  She also has moderate degenerative change of the lower back.  Both of these can cause back pain.  There is no indication for hospitalization or further ED intervention at this time.  Covid testing negative.  Catherine Harrell was evaluated in Emergency Department on 09/24/2020 for the symptoms described in the history of present illness. She was evaluated in the context of the global COVID-19 pandemic, which necessitated consideration that the patient might be at risk for infection with the SARS-CoV-2 virus that causes COVID-19. Institutional protocols and algorithms that pertain to the evaluation of patients at risk for COVID-19 are in a state of rapid change based on information released by  regulatory bodies including the CDC and federal and state organizations. These policies and algorithms were followed during the patient's care in the ED.   CRITICAL CARE-no Performed by: Daleen Bo  Nursing Notes Reviewed/ Care Coordinated Applicable Imaging Reviewed Interpretation of Laboratory Data incorporated into ED  treatment  The patient appears reasonably screened and/or stabilized for discharge and I doubt any other medical condition or other [facility] requiring further screening, evaluation, or treatment in the ED at this time prior to discharge.  Plan: Home Medications-continue usual, Colace if needed for constipation; Home Treatments-currently advance diet and activity; return here if the recommended treatment, does not improve the symptoms; Recommended follow up-PCP follow-up 1 week and as needed     Final Clinical Impression(s) / ED Diagnoses Final diagnoses:  COPD exacerbation ([place])  Spondylosis of lumbar region without myelopathy or radiculopathy  Compression fracture of L1 vertebra, initial encounter [facility])    Rx / DC Orders ED Discharge Orders         Ordered    traMADol-acetaminophen (ULTRACET) 37.5-325 MG tablet  Every 6 hours PRN        09/24/20 2055           Daleen Bo, MD 09/24/20 [phone]

## 2020-09-25 ENCOUNTER — Telehealth: Payer: Self-pay

## 2020-09-25 NOTE — Telephone Encounter (Signed)
°  Contact Date: 09/25/2020 Contacted By: Theadore Nan  Transition Care Management Follow-up Telephone Call  Date of discharge and from where: 09/24/2020  How have you been since you were released from the hospital? Catherine Harrell  Any questions or concerns? Yes   Items Reviewed:  Did the pt receive and understand the discharge instructions provided? Yes   Medications obtained and verified? Yes   Any new allergies since your discharge? No   Dietary orders reviewed? No  Do you have support at home? Yes   Discontinued Medications None New Medications Added None  Current Medication List Allergies as of 09/25/2020      Reactions   Levofloxacin       Medication List       Accurate as of September 25, 2020  4:19 PM. If you have any questions, ask your nurse or doctor.        Breo Ellipta 100-25 MCG/INH Aepb Generic drug: fluticasone furoate-vilanterol Inhale into the lungs.   CALCIUM 1200 PO Take 1 tablet by mouth daily.   cholecalciferol 25 MCG (1000 UNIT) tablet Commonly known as: VITAMIN D3 Take 1,000 Units by mouth daily.   gabapentin 300 MG capsule Commonly known as: NEURONTIN Take 3 capsules (900 mg total) by mouth at bedtime.   ipratropium-albuterol 0.5-2.5 (3) MG/3ML Soln Commonly known as: DUONEB INHALE CONTENTS OF 1 VIAL VIA NEBULIZER EVERY 4 HOURS AS NEEDED What changed: See the new instructions.   levofloxacin 500 MG tablet Commonly known as: LEVAQUIN Take 1 tablet (500 mg total) by mouth daily.   losartan 25 MG tablet Commonly known as: COZAAR TAKE 1 TABLET BY MOUTH EVERY DAY   predniSONE 5 MG tablet Commonly known as: DELTASONE Take 3 tablets (15 mg total) by mouth daily with breakfast.   Stiolto Respimat 2.5-2.5 MCG/ACT Aers Generic drug: Tiotropium Bromide-Olodaterol Inhale 2 puffs into the lungs daily.   traMADol-acetaminophen 37.5-325 MG tablet Commonly known as: Ultracet Take 1 tablet by mouth every 6 (six) hours as needed for  moderate pain or severe pain.        Home Care and Equipment/Supplies: Were home health services ordered? no If so, what is the name of the agency?  Has the agency set up a time to come to the patient's home? no Were any new equipment or medical supplies ordered?  No What is the name of the medical supply agency? n/a Were you able to get the supplies/equipment? not applicable Do you have any questions related to the use of the equipment or supplies? No  Functional Questionnaire: (I = Independent and D = Dependent) ADLs: independent with assist   Bathing/Dressing- independent with assist  Meal Prep- dependent   Eating- independent  Maintaining continence- independent  Transferring/Ambulation- dependent  Managing Meds- dependent  Follow up appointments reviewed:   PCP Hospital f/u appt confirmed? Yes  Scheduled to see Je on 10/09/2020.  Specialist Hospital f/u appt confirmed? No    Are transportation arrangements needed? No   If their condition worsens, is the pt aware to call PCP or go to the Emergency Dept.? Yes  Was the patient provided with contact information for the PCP's office or ED? Yes  Was to pt encouraged to call back with questions or concerns? Yes

## 2020-09-27 ENCOUNTER — Ambulatory Visit: Payer: Medicare HMO | Admitting: Family Medicine

## 2020-09-30 LAB — CULTURE, BLOOD (ROUTINE X 2)
Culture: NO GROWTH
Culture: NO GROWTH
Special Requests: ADEQUATE
Special Requests: ADEQUATE

## 2020-10-02 ENCOUNTER — Telehealth: Payer: Self-pay

## 2020-10-02 NOTE — Telephone Encounter (Signed)
Patient states she needs to qualify for oxygen and will wait until she see's Dr. Darlyn Read' on 11/16 to talk with him about it.

## 2020-10-06 ENCOUNTER — Other Ambulatory Visit: Payer: Self-pay | Admitting: Family Medicine

## 2020-10-06 DIAGNOSIS — J411 Mucopurulent chronic bronchitis: Secondary | ICD-10-CM

## 2020-10-09 ENCOUNTER — Ambulatory Visit (INDEPENDENT_AMBULATORY_CARE_PROVIDER_SITE_OTHER): Payer: Medicare HMO

## 2020-10-09 ENCOUNTER — Ambulatory Visit (INDEPENDENT_AMBULATORY_CARE_PROVIDER_SITE_OTHER): Payer: Medicare HMO | Admitting: Nurse Practitioner

## 2020-10-09 ENCOUNTER — Encounter: Payer: Self-pay | Admitting: Nurse Practitioner

## 2020-10-09 ENCOUNTER — Other Ambulatory Visit: Payer: Self-pay

## 2020-10-09 VITALS — BP 143/78 | HR 103 | Temp 98.1°F | Resp 22 | Ht 65.0 in | Wt 134.0 lb

## 2020-10-09 DIAGNOSIS — J449 Chronic obstructive pulmonary disease, unspecified: Secondary | ICD-10-CM

## 2020-10-09 DIAGNOSIS — M810 Age-related osteoporosis without current pathological fracture: Secondary | ICD-10-CM | POA: Diagnosis not present

## 2020-10-09 DIAGNOSIS — J411 Mucopurulent chronic bronchitis: Secondary | ICD-10-CM

## 2020-10-09 DIAGNOSIS — Z780 Asymptomatic menopausal state: Secondary | ICD-10-CM

## 2020-10-09 DIAGNOSIS — Z09 Encounter for follow-up examination after completed treatment for conditions other than malignant neoplasm: Secondary | ICD-10-CM | POA: Diagnosis not present

## 2020-10-09 DIAGNOSIS — Z139 Encounter for screening, unspecified: Secondary | ICD-10-CM

## 2020-10-09 MED ORDER — LOSARTAN POTASSIUM 25 MG PO TABS: 25.0000 mg | ORAL_TABLET | Freq: Every day | ORAL | 1 refills | Status: DC

## 2020-10-09 MED ORDER — GABAPENTIN 300 MG PO CAPS: 900.0000 mg | ORAL_CAPSULE | Freq: Every day | ORAL | 1 refills | Status: DC

## 2020-10-09 MED ORDER — PREDNISONE 5 MG PO TABS: 15.0000 mg | ORAL_TABLET | Freq: Every day | ORAL | 0 refills | Status: DC

## 2020-10-09 MED ORDER — CALCIUM 1200 1200-1000 MG-UNIT PO CHEW: 1.0000 | CHEWABLE_TABLET | Freq: Every day | ORAL | 3 refills | Status: AC

## 2020-10-09 MED ORDER — TRAMADOL-ACETAMINOPHEN 37.5-325 MG PO TABS
1.0000 | ORAL_TABLET | Freq: Four times a day (QID) | ORAL | 0 refills | Status: DC | PRN
Start: 2020-10-09 — End: 2021-03-14

## 2020-10-09 NOTE — Assessment & Plan Note (Signed)
Follow-up after hospital discharge.  Patient is reporting symptoms are better, denies fever nausea vomiting.  Slight shortness of breath and cough present today.  Completed hospital discharge instructions medication reconciliation.  Patient verbalized understanding.  Rx refill sent to pharmacy.  DEXA scan completed, results pending.

## 2020-10-09 NOTE — Assessment & Plan Note (Signed)
Face-to-face visit completed for Home oxygen needs.  Patient on 2 L oxygen is 97 % sitting and 95 % walking.  Without oxygen patient is 96% sitting and 88% walking. Continue to provide education with printed handouts given. Patient to follow-up with worsening or unresolved symptoms.

## 2020-10-09 NOTE — Progress Notes (Signed)
Established Patient Office Visit  Subjective:  Patient ID: Catherine Harrell, female    DOB: 06-Nov-1946  Age: 73 y.o. MRN: [id number]  CC:  Chief Complaint  Patient presents with   face to face    for oxygen orders    ER follow up    Catherine Harrell 11/1- COPD and fx of lumbar     HPI Catherine Harrell presents for COPD exacerbation after hospital discharge.  Patient was admitted to [facility] with shortness of breath, and low back pain.  Patient reports no fever today, focal weakness or paresthesia.  Patient continues to use a walker and the need for oxygen.  Completed face-to-face assessment with patient.  Patient with 2 L oxygen has a saturation of 97% sitting, and 95% walking.  Without oxygen patient saturation is 96% sitting and 88% walking.  Completed hospital discharge instructions with medication reconciliation.  Patient verbalized understanding.    Past Medical History:  Diagnosis Date   COPD (chronic obstructive pulmonary disease) (HCC)    Hypertension    Osteoporosis    TMJ arthralgia    left    History reviewed. No pertinent surgical history.  Family History  Problem Relation Age of Onset   Heart disease Father    COPD Father    Heart disease Sister    Heart attack Sister    Diabetes Sister    COPD Sister    Heart disease Brother    Cancer Daughter        breast   Hypertension Mother    Diabetes Mother    Healthy Son    Cancer Son        Prostate    Kidney disease Daughter    Healthy Son     Social History   Socioeconomic History   Marital status: Married    Spouse name: Not on file   Number of children: 4   Years of education: Not on file   Highest education level: 9th grade  Occupational History   Occupation: Retired  Tobacco Use   Smoking status: Former Smoker    Packs/day: 1.00    Years: 30.00    Pack years: 30.00    Types: Cigarettes    Quit date: 01/16/2005    Years since quitting: 15.7    Smokeless tobacco: Never Used  Vaping Use   Vaping Use: Never used  Substance and Sexual Activity   Alcohol use: No   Drug use: No   Sexual activity: Not Currently  Other Topics Concern   Not on file  Social History Narrative   Not on file   Social Determinants of Health   Financial Resource Strain:    Difficulty of Paying Living Expenses: Not on file  Food Insecurity:    Worried About Programme researcher, broadcasting/film/video in the Last Year: Not on file   [employer] of Food in the Last Year: Not on file  Transportation Needs:    Lack of Transportation (Medical): Not on file   Lack of Transportation (Non-Medical): Not on file  Physical Activity:    Days of Exercise per Week: Not on file   Minutes of Exercise per Session: Not on file  Stress:    Feeling of Stress : Not on file  Social Connections:    Frequency of Communication with Friends and Family: Not on file   Frequency of Social Gatherings with Friends and Family: Not on file   Attends Religious Services: Not on file  Active Member of Clubs or Organizations: Not on file   Attends Banker Meetings: Not on file   Marital Status: Not on file  Intimate Partner Violence:    Fear of Current or Ex-Partner: Not on file   Emotionally Abused: Not on file   Physically Abused: Not on file   Sexually Abused: Not on file    Outpatient Medications Prior to Visit  Medication Sig Dispense Refill   Calcium Carbonate-Vit D-Min (CALCIUM 1200 PO) Take 1 tablet by mouth daily.     cholecalciferol (VITAMIN D3) 25 MCG (1000 UNIT) tablet Take 1,000 Units by mouth daily.     gabapentin (NEURONTIN) 300 MG capsule Take 3 capsules (900 mg total) by mouth at bedtime. 270 capsule 1   ipratropium-albuterol (DUONEB) 0.5-2.5 (3) MG/3ML SOLN INHALE CONTENTS OF 1 VIAL VIA NEBULIZER EVERY 4 HOURS AS NEEDED (Patient taking differently: 3 mLs every 4 (four) hours as needed. INHALE CONTENTS OF 1 VIAL VIA NEBULIZER EVERY 4 HOURS AS  NEEDED) 360 mL 2   losartan (COZAAR) 25 MG tablet TAKE 1 TABLET BY MOUTH EVERY DAY 90 tablet 1   predniSONE (DELTASONE) 5 MG tablet Take 3 tablets (15 mg total) by mouth daily with breakfast. 90 tablet 0   fluticasone furoate-vilanterol (BREO ELLIPTA) 100-25 MCG/INH AEPB Inhale into the lungs. (Patient not taking: Reported on 09/24/2020)     levofloxacin (LEVAQUIN) 500 MG tablet Take 1 tablet (500 mg total) by mouth daily. (Patient not taking: Reported on 09/24/2020) 7 tablet 0   Tiotropium Bromide-Olodaterol (STIOLTO RESPIMAT) 2.5-2.5 MCG/ACT AERS Inhale 2 puffs into the lungs daily. 4 g 11   traMADol-acetaminophen (ULTRACET) 37.5-325 MG tablet Take 1 tablet by mouth every 6 (six) hours as needed for moderate pain or severe pain. 20 tablet 0   No facility-administered medications prior to visit.    Allergies  Allergen Reactions   Levofloxacin     ROS Review of Systems  Respiratory: Positive for cough and shortness of breath. Negative for wheezing.   Musculoskeletal: Positive for arthralgias and myalgias.       Patient uses a cane  All other systems reviewed and are negative.     Objective:    Physical Exam Vitals reviewed.  Constitutional:      Appearance: Normal appearance.  HENT:     Head: Normocephalic.  Eyes:     Conjunctiva/sclera: Conjunctivae normal.  Cardiovascular:     Rate and Rhythm: Normal rate and regular rhythm.     Pulses: Normal pulses.     Heart sounds: Normal heart sounds.  Pulmonary:     Comments: COPD, shortness of breath. Abdominal:     General: Bowel sounds are normal.  Musculoskeletal:        General: Tenderness present.     Comments: Walks with a cane  Skin:    General: Skin is warm.  Neurological:     Mental Status: She is alert and oriented to person, place, and time.  Psychiatric:        Mood and Affect: Mood normal.        Behavior: Behavior normal.     BP (!) 143/78    Pulse (!) 103    Temp 98.1 F (36.7 C)    Resp (!) 22     Ht 5\' 5"  (1.651 m)    Wt 134 lb (60.8 kg)    SpO2 95% Comment: walking with 2 liters oxygen   BMI 22.30 kg/m  Wt Readings from Last 3 Encounters:  10/09/20 134 lb (60.8 kg)  09/24/20 140 lb (63.5 kg)  05/02/20 138 lb (62.6 kg)     Health Maintenance Due  Topic Date Due   Hepatitis C Screening  Never done   COLON CANCER SCREENING ANNUAL FOBT  Never done   COLONOSCOPY  Never done   TETANUS/TDAP  10/07/2020    There are no preventive care reminders to display for this patient.  Lab Results  Component Value Date   TSH 2.520 07/21/2018   Lab Results  Component Value Date   WBC 13.2 (H) 09/24/2020   HGB 14.3 09/24/2020   HCT 46.3 (H) 09/24/2020   MCV 96.1 09/24/2020   PLT 407 (H) 09/24/2020   Lab Results  Component Value Date   NA 132 (L) 09/24/2020   K 4.2 09/24/2020   CO2 27 09/24/2020   GLUCOSE 124 (H) 09/24/2020   BUN 12 09/24/2020   CREATININE 0.92 09/24/2020   BILITOT 0.5 09/24/2020   ALKPHOS 63 09/24/2020   AST 15 09/24/2020   ALT 14 09/24/2020   PROT 7.5 09/24/2020   ALBUMIN 3.7 09/24/2020   CALCIUM 9.7 09/24/2020   ANIONGAP 9 09/24/2020   Lab Results  Component Value Date   CHOL 191 12/06/2019   Lab Results  Component Value Date   HDL 78 12/06/2019   Lab Results  Component Value Date   LDLCALC 99 12/06/2019   Lab Results  Component Value Date   TRIG 79 12/06/2019   Lab Results  Component Value Date   CHOLHDL 2.4 12/06/2019   No results found for: HGBA1C    Assessment & Plan:   Problem List Items Addressed This Visit      Respiratory   Chronic obstructive pulmonary disease (HCC)    Face-to-face visit completed for Home oxygen needs.  Patient on 2 L oxygen is 97 % sitting and 95 % walking.  Without oxygen patient is 96% sitting and 88% walking. Continue to provide education with printed handouts given. Patient to follow-up with worsening or unresolved symptoms.       Relevant Orders   For home use only DME oxygen     Other    Hospital discharge follow-up - Primary    Follow-up after hospital discharge.  Patient is reporting symptoms are better, denies fever nausea vomiting.  Slight shortness of breath and cough present today.  Completed hospital discharge instructions medication reconciliation.  Patient verbalized understanding.  Rx refill sent to pharmacy.  DEXA scan completed, results pending.       Other Visit Diagnoses    Encounter for health-related screening       Relevant Orders   DG [facility] DEXA        Follow-up: Return if symptoms worsen or fail to improve.    Daryll Drown, NP

## 2020-10-09 NOTE — Patient Instructions (Signed)
Home oxygen ordered  COPD and Physical Activity Chronic obstructive pulmonary disease (COPD) is a long-term (chronic) condition that affects the lungs. COPD is a general term that can be used to describe many different lung problems that cause lung swelling (inflammation) and limit airflow, including chronic bronchitis and emphysema. The main symptom of COPD is shortness of breath, which makes it harder to do even simple tasks. This can also make it harder to exercise and be active. Talk with your health care provider about treatments to help you breathe better and actions you can take to prevent breathing problems during physical activity. What are the benefits of exercising with COPD? Exercising regularly is an important part of a healthy lifestyle. You can still exercise and do physical activities even though you have COPD. Exercise and physical activity improve your shortness of breath by increasing blood flow (circulation). This causes your heart to pump more oxygen through your body. Moderate exercise can improve your:  Oxygen use.  Energy level.  Shortness of breath.  Strength in your breathing muscles.  Heart health.  Sleep.  Self-esteem and feelings of self-worth.  Depression, stress, and anxiety levels. Exercise can benefit everyone with COPD. The severity of your disease may affect how hard you can exercise, especially at first, but everyone can benefit. Talk with your health care provider about how much exercise is safe for you, and which activities and exercises are safe for you. What actions can I take to prevent breathing problems during physical activity?  Sign up for a pulmonary rehabilitation program. This type of program may include: ? Education about lung diseases. ? Exercise classes that teach you how to exercise and be more active while improving your breathing. This usually involves:  Exercise using your lower extremities, such as a stationary bicycle.  About 30  minutes of exercise, 2 to 5 times per week, for 6 to 12 weeks  Strength training, such as push ups or leg lifts. ? Nutrition education. ? Group classes in which you can talk with others who also have COPD and learn ways to manage stress.  If you use an oxygen tank, you should use it while you exercise. Work with your health care provider to adjust your oxygen for your physical activity. Your resting flow rate is different from your flow rate during physical activity.  While you are exercising: ? Take slow breaths. ? Pace yourself and do not try to go too fast. ? Purse your lips while breathing out. Pursing your lips is similar to a kissing or whistling position. ? If doing exercise that uses a quick burst of effort, such as weight lifting:  Breathe in before starting the exercise.  Breathe out during the hardest part of the exercise (such as raising the weights). Where to find support You can find support for exercising with COPD from:  Your health care provider.  A pulmonary rehabilitation program.  Your local health department or community health programs.  Support groups, online or in-person. Your health care provider may be able to recommend support groups. Where to find more information You can find more information about exercising with COPD from:  American Lung Association: [place].  COPD Foundation: AlmostHot.gl. Contact a health care provider if:  Your symptoms get worse.  You have chest pain.  You have nausea.  You have a fever.  You have trouble talking or catching your breath.  You want to start a new exercise program or a new activity. Summary  COPD is a  general term that can be used to describe many different lung problems that cause lung swelling (inflammation) and limit airflow. This includes chronic bronchitis and emphysema.  Exercise and physical activity improve your shortness of breath by increasing blood flow (circulation). This causes  your heart to provide more oxygen to your body.  Contact your health care provider before starting any exercise program or new activity. Ask your health care provider what exercises and activities are safe for you. This information is not intended to replace advice given to you by your health care provider. Make sure you discuss any questions you have with your health care provider. Document Revised: 03/02/2019 Document Reviewed: 12/03/2017 Elsevier Patient Education  2020 [employer].

## 2020-10-10 DIAGNOSIS — J449 Chronic obstructive pulmonary disease, unspecified: Secondary | ICD-10-CM | POA: Diagnosis not present

## 2020-10-24 ENCOUNTER — Ambulatory Visit: Payer: Medicare HMO | Admitting: Family Medicine

## 2020-10-24 ENCOUNTER — Telehealth: Payer: Self-pay

## 2020-10-24 NOTE — Telephone Encounter (Signed)
[facility] brough pt her oxygen, she didn't really have any issues or questions

## 2020-11-01 ENCOUNTER — Other Ambulatory Visit: Payer: Self-pay | Admitting: Family Medicine

## 2020-11-01 DIAGNOSIS — J411 Mucopurulent chronic bronchitis: Secondary | ICD-10-CM

## 2020-11-02 MED ORDER — PREDNISONE 5 MG PO TABS: 15.0000 mg | ORAL_TABLET | Freq: Every day | ORAL | 0 refills | Status: DC

## 2020-11-08 ENCOUNTER — Emergency Department (HOSPITAL_COMMUNITY)
Admission: EM | Admit: 2020-11-08 | Discharge: 2020-11-08 | Disposition: A | Discharge status: Home / Self Care | Payer: Medicare HMO | Attending: Emergency Medicine | Admitting: Emergency Medicine

## 2020-11-08 ENCOUNTER — Other Ambulatory Visit: Payer: Self-pay

## 2020-11-08 ENCOUNTER — Emergency Department (HOSPITAL_COMMUNITY): Payer: Medicare HMO

## 2020-11-08 ENCOUNTER — Encounter (HOSPITAL_COMMUNITY): Payer: Self-pay | Admitting: Emergency Medicine

## 2020-11-08 DIAGNOSIS — J90 Pleural effusion, not elsewhere classified: Secondary | ICD-10-CM | POA: Diagnosis not present

## 2020-11-08 DIAGNOSIS — Z79899 Other long term (current) drug therapy: Secondary | ICD-10-CM | POA: Insufficient documentation

## 2020-11-08 DIAGNOSIS — R0602 Shortness of breath: Secondary | ICD-10-CM | POA: Diagnosis not present

## 2020-11-08 DIAGNOSIS — I10 Essential (primary) hypertension: Secondary | ICD-10-CM | POA: Diagnosis not present

## 2020-11-08 DIAGNOSIS — J441 Chronic obstructive pulmonary disease with (acute) exacerbation: Secondary | ICD-10-CM | POA: Diagnosis not present

## 2020-11-08 DIAGNOSIS — Z87891 Personal history of nicotine dependence: Secondary | ICD-10-CM | POA: Insufficient documentation

## 2020-11-08 DIAGNOSIS — Z20822 Contact with and (suspected) exposure to covid-19: Secondary | ICD-10-CM | POA: Diagnosis not present

## 2020-11-08 DIAGNOSIS — R062 Wheezing: Secondary | ICD-10-CM | POA: Diagnosis not present

## 2020-11-08 DIAGNOSIS — R000 Tachycardia, unspecified: Secondary | ICD-10-CM | POA: Diagnosis not present

## 2020-11-08 LAB — RESP PANEL BY RT-PCR (FLU A&B, COVID) ARPGX2
Influenza A by PCR: NEGATIVE
Influenza B by PCR: NEGATIVE
SARS Coronavirus 2 by RT PCR: NEGATIVE

## 2020-11-08 LAB — COMPREHENSIVE METABOLIC PANEL
ALT: 16 U/L (ref 0–44)
AST: 17 U/L (ref 15–41)
Albumin: 3.5 g/dL (ref 3.5–5.0)
Alkaline Phosphatase: 65 U/L (ref 38–126)
Anion gap: 10 (ref 5–15)
BUN: 9 mg/dL (ref 8–23)
CO2: 32 mmol/L (ref 22–32)
Calcium: 9.7 mg/dL (ref 8.9–10.3)
Chloride: 86 mmol/L — ABNORMAL LOW (ref 98–111)
Creatinine, Ser: 0.75 mg/dL (ref 0.44–1.00)
GFR, Estimated: >60 mL/min (ref >60)
Glucose, Bld: 114 mg/dL — ABNORMAL HIGH (ref 70–99)
Potassium: 3.5 mmol/L (ref 3.5–5.1)
Sodium: 128 mmol/L — ABNORMAL LOW (ref 135–145)
Total Bilirubin: 0.5 mg/dL (ref 0.3–1.2)
Total Protein: 7.8 g/dL (ref 6.5–8.1)

## 2020-11-08 LAB — CBC
HCT: 43.1 % (ref 36.0–46.0)
Hemoglobin: 13.9 g/dL (ref 12.0–15.0)
MCH: 29.9 pg (ref 26.0–34.0)
MCHC: 32.3 g/dL (ref 30.0–36.0)
MCV: 92.7 fL (ref 80.0–100.0)
Platelets: 493 10*3/uL — ABNORMAL HIGH (ref 150–400)
RBC: 4.65 MIL/uL (ref 3.87–5.11)
RDW: 12.3 % (ref 11.5–15.5)
WBC: 11.9 10*3/uL — ABNORMAL HIGH (ref 4.0–10.5)
nRBC: 0 % (ref 0.0–0.2)

## 2020-11-08 LAB — BRAIN NATRIURETIC PEPTIDE: B Natriuretic Peptide: 76 pg/mL (ref 0.0–100.0)

## 2020-11-08 LAB — MAGNESIUM: Magnesium: 1.8 mg/dL (ref 1.7–2.4)

## 2020-11-08 LAB — LACTIC ACID, PLASMA: Lactic Acid, Venous: 1.2 mmol/L (ref 0.5–1.9)

## 2020-11-08 MED ORDER — METHYLPREDNISOLONE SODIUM SUCC 125 MG IJ SOLR
125.0000 mg | Freq: Once | INTRAMUSCULAR | Status: AC
  Administered 2020-11-08: 15:00:00 125 mg via INTRAVENOUS
  Filled 2020-11-08: qty 2

## 2020-11-08 MED ORDER — ALBUTEROL SULFATE (2.5 MG/3ML) 0.083% IN NEBU
2.5000 mg | INHALATION_SOLUTION | Freq: Once | RESPIRATORY_TRACT | Status: AC
  Administered 2020-11-08: 17:00:00 2.5 mg via RESPIRATORY_TRACT
  Filled 2020-11-08: qty 3

## 2020-11-08 MED ORDER — IPRATROPIUM-ALBUTEROL 0.5-2.5 (3) MG/3ML IN SOLN
3.0000 mL | Freq: Once | RESPIRATORY_TRACT | Status: AC
  Administered 2020-11-08: 17:00:00 3 mL via RESPIRATORY_TRACT
  Filled 2020-11-08: qty 3

## 2020-11-08 MED ORDER — IPRATROPIUM-ALBUTEROL 20-100 MCG/ACT IN AERS: 2.0000 | INHALATION_SPRAY | Freq: Four times a day (QID) | RESPIRATORY_TRACT | Status: DC

## 2020-11-08 MED ORDER — MAGNESIUM SULFATE 2 GM/50ML IV SOLN
2.0000 g | Freq: Once | INTRAVENOUS | Status: AC
  Administered 2020-11-08: 15:00:00 2 g via INTRAVENOUS
  Filled 2020-11-08: qty 50

## 2020-11-08 MED ORDER — PREDNISONE 10 MG PO TABS: 60.0000 mg | ORAL_TABLET | Freq: Every day | ORAL | 0 refills | Status: AC

## 2020-11-08 MED ORDER — ALBUTEROL SULFATE HFA 108 (90 BASE) MCG/ACT IN AERS
6.0000 | INHALATION_SPRAY | Freq: Once | RESPIRATORY_TRACT | Status: AC
  Administered 2020-11-08: 15:00:00 6 via RESPIRATORY_TRACT
  Filled 2020-11-08: qty 6.7

## 2020-11-08 NOTE — ED Provider Notes (Signed)
[facility] EMERGENCY DEPARTMENT Provider Note   CSN: [id number] Arrival date & time: 11/08/20  1407     History Chief Complaint  Patient presents with  . Shortness of Breath    Catherine Harrell is a 74 y.o. female.   HPI Patient 73 year old female with past medical history of COPD, HTN, osteoporosis and severe poor hearing  Patient presents today with shortness of breath. Level 5 caveat due to severe hardness of hearing primarily daughter was most useful in giving history. I was able to communicate via writing.  Per daughter patient has been having difficulty breathing has been worsening over the past 3 to 4 months. She is long had COPD but has never been on any inhalers other than approximately once daily nebulizer treatments which she is prescribed up to as often as 3 or 4 times daily. Approximately 1 month ago she had a COPD exacerbation was given medicines has significant improvement in her symptoms and was discharged. She is now on prednisone  daily.  She denies any chest pain lightheadedness or dizziness but states that she feels very short of breath which is been worsening over the past few days. She is on oxygen at home on my review of PCP notes it appears that she was started on this after her last ED visit 1 month ago. She has been found to desat to 88% on room air.  She also states she has not had any hemoptysis. No chest pain.  She states that she has had some bilateral foot swelling that she first noticed a few days ago. Denies any calf swelling or tenderness.  States she does not have a cardiologist and daughter is unaware whether she has ever had an ultrasound of her heart done before.    Past Medical History:  Diagnosis Date  . COPD (chronic obstructive pulmonary disease) (HCC)   . Hypertension   . Osteoporosis   . TMJ arthralgia    left    Patient Active Problem List   Diagnosis Date Noted  . Hospital discharge follow-up 10/09/2020  . Presbycusis of  both ears 12/06/2019  . Osteoporosis 06/09/2017  . Essential hypertension 08/07/2014  . Chronic obstructive pulmonary disease (HCC) 01/16/2014    History reviewed. No pertinent surgical history.   OB History   No obstetric history on file.     Family History  Problem Relation Age of Onset  . Heart disease Father   . COPD Father   . Heart disease Sister   . Heart attack Sister   . Diabetes Sister   . COPD Sister   . Heart disease Brother   . Cancer Daughter        breast  . Hypertension Mother   . Diabetes Mother   . Healthy Son   . Cancer Son        Prostate   . Kidney disease Daughter   . Healthy Son     Social History   Tobacco Use  . Smoking status: Former Smoker    Packs/day: 1.00    Years: 30.00    Pack years: 30.00    Types: Cigarettes    Quit date: 01/16/2005    Years since quitting: 15.8  . Smokeless tobacco: Never Used  Vaping Use  . Vaping Use: Never used  Substance Use Topics  . Alcohol use: No  . Drug use: No    Home Medications Prior to Admission medications   Medication Sig Start Date End Date Taking? Authorizing Provider  acetaminophen (  TYLENOL) 325 MG tablet Take 650 mg by mouth every 6 (six) hours as needed.   Yes [provider]  Calcium Carbonate-Vit D-Min (CALCIUM 1200) 1200-1000 MG-UNIT CHEW Chew 1 tablet by mouth daily. 10/09/20  Yes Daryll Drown, NP  cholecalciferol (VITAMIN D3) 25 MCG (1000 UNIT) tablet Take 1,000 Units by mouth daily.   Yes [provider]  gabapentin (NEURONTIN) 300 MG capsule Take 3 capsules (900 mg total) by mouth at bedtime. 10/09/20  Yes Daryll Drown, NP  ibuprofen (ADVIL) 200 MG tablet Take 200 mg by mouth every 6 (six) hours as needed.   Yes [provider]  ipratropium-albuterol (DUONEB) 0.5-2.5 (3) MG/3ML SOLN INHALE CONTENTS OF 1 VIAL VIA NEBULIZER EVERY 4 HOURS AS NEEDED Patient taking differently: 3 mLs every 4 (four) hours as needed. INHALE CONTENTS OF 1 VIAL VIA  NEBULIZER EVERY 4 HOURS AS NEEDED 05/29/20  Yes Stacks, Broadus John, MD  losartan (COZAAR) 25 MG tablet Take 1 tablet (25 mg total) by mouth daily. 10/09/20  Yes Daryll Drown, NP  predniSONE (DELTASONE) 5 MG tablet Take 3 tablets (15 mg total) by mouth daily with breakfast. 11/02/20  Yes [name], MD  predniSONE (DELTASONE) 10 MG tablet Take 6 tablets (60 mg total) by mouth daily for 4 days. 11/08/20 11/12/20  Gailen Shelter, PA  traMADol-acetaminophen (ULTRACET) 37.5-325 MG tablet Take 1 tablet by mouth every 6 (six) hours as needed. Patient not taking: Reported on 11/08/2020 10/09/20   Daryll Drown, NP    Allergies    Levofloxacin  Review of Systems   Review of Systems  Constitutional: Negative for chills and fever.  HENT: Negative for congestion.   Eyes: Negative for pain.  Respiratory: Positive for shortness of breath. Negative for cough.   Cardiovascular: Positive for leg swelling. Negative for chest pain.  Gastrointestinal: Negative for abdominal pain, diarrhea, nausea and vomiting.  Genitourinary: Negative for dysuria.  Musculoskeletal: Negative for myalgias.  Skin: Negative for rash.  Neurological: Negative for dizziness and headaches.    Physical Exam Updated Vital Signs BP (!) 145/83   Pulse 100   Temp 98.3 F (36.8 C) (Oral)   Resp (!) 32   Ht 5\' 5"  (1.651 m)   Wt 65.8 kg   SpO2 98%   BMI 24.13 kg/m   Physical Exam Vitals and nursing note reviewed.  Constitutional:      General: She is in acute distress.     Comments: Patient is 74 year old female appears stated age has significant increased work of breathing is tachypneic  HENT:     Head: Normocephalic and atraumatic.     Nose: Nose normal.  Eyes:     General: No scleral icterus. Neck:     Comments: No JVP Cardiovascular:     Rate and Rhythm: Normal rate and regular rhythm.     Pulses: Normal pulses.     Heart sounds: Normal heart sounds.  Pulmonary:     Effort: Pulmonary effort is normal.  No respiratory distress.     Breath sounds: Wheezing present.     Comments: Diffuse wheezing with poor inspiration Tachypneic with rate of approximately 30-40  crackles auscultated in bilateral bases Abdominal:     Palpations: Abdomen is soft.     Tenderness: There is no abdominal tenderness. There is no guarding or rebound.  Musculoskeletal:     Cervical back: Normal range of motion.     Right lower leg: Edema present.     Left lower leg:  Edema present.     Comments: Small amount of bilateral foot edema proximately 1+ to the proximal ankle  Skin:    General: Skin is warm and dry.     Capillary Refill: Capillary refill takes less than 2 seconds.  Neurological:     Mental Status: She is alert. Mental status is at baseline.  Psychiatric:        Mood and Affect: Mood normal.        Behavior: Behavior normal.     ED Results / Procedures / Treatments   Labs (all labs ordered are listed, but only abnormal results are displayed) Labs Reviewed  CBC - Abnormal; Notable for the following components:      Result Value   WBC 11.9 (*)    Platelets 493 (*)    All other components within normal limits  COMPREHENSIVE METABOLIC PANEL - Abnormal; Notable for the following components:   Sodium 128 (*)    Chloride 86 (*)    Glucose, Bld 114 (*)    All other components within normal limits  RESP PANEL BY RT-PCR (FLU A&B, COVID) ARPGX2  CULTURE, BLOOD (SINGLE)  MAGNESIUM  BRAIN NATRIURETIC PEPTIDE  LACTIC ACID, PLASMA  URINALYSIS, ROUTINE W REFLEX MICROSCOPIC    EKG None  Radiology DG Chest Portable 1 View  Result Date: 11/08/2020 CLINICAL DATA:  Shortness of breath. Lower extremity edema. Wheezing. EXAM: PORTABLE CHEST 1 VIEW COMPARISON:  Radiograph 09/24/2020, CT 12/27/2019 FINDINGS: Chronic lung disease with bronchial thickening, right greater than left upper lobe scarring and diffuse interstitial coarsening. Middle lobe and lingular scarring or better appreciated on prior CT.  Similar findings to prior exam. The heart is normal in size. Unchanged mediastinal contours with aortic atherosclerosis. There may be a trace right pleural effusion. No pneumothorax. IMPRESSION: Chronic lung disease with diffuse bronchial thickening and lung scarring. Possible trace right pleural effusion. Electronically Signed   By: Narda RutherfordMelanie  Sanford M.D.   On: 11/08/2020 15:40    Procedures .Critical Care Performed by: Gailen ShelterFondaw, Chealsea Paske S, PA Authorized by: Gailen ShelterFondaw, Samuel Rittenhouse S, PA   Critical care provider statement:    Critical care time (minutes):  35   Critical care time was exclusive of:  Separately billable procedures and treating other patients and teaching time   Critical care was necessary to treat or prevent imminent or life-threatening deterioration of the following conditions: Respiratory failure secondary to COPD exacerbation required numerous medications, RT consultation, eventually improved and was stable enough for discharge.   Critical care was time spent personally by me on the following activities:  Discussions with consultants, evaluation of patient's response to treatment, examination of patient, review of old charts, re-evaluation of patient's condition, pulse oximetry, ordering and review of radiographic studies, ordering and review of laboratory studies and ordering and performing treatments and interventions   I assumed direction of critical care for this patient from another provider in my specialty: no     (including critical care time)  Medications Ordered in ED Medications  Ipratropium-Albuterol (COMBIVENT) respimat 2 puff (2 puffs Inhalation Not Given 11/08/20 1554)  magnesium sulfate IVPB 2 g 50 mL (0 g Intravenous Stopped 11/08/20 1623)  albuterol (VENTOLIN HFA) 108 (90 Base) MCG/ACT inhaler 6 puff (6 puffs Inhalation Given 11/08/20 1520)  methylPREDNISolone sodium succinate (SOLU-MEDROL) 125 mg/2 mL injection 125 mg (125 mg Intravenous Given 11/08/20 1520)   ipratropium-albuterol (DUONEB) 0.5-2.5 (3) MG/3ML nebulizer solution 3 mL (3 mLs Nebulization Given 11/08/20 1722)  albuterol (PROVENTIL) (2.5 MG/3ML) 0.083% nebulizer solution 2.5  mg (2.5 mg Nebulization Given 11/08/20 1722)    ED Course  I have reviewed the triage vital signs and the nursing notes.  Pertinent labs & imaging results that were available during my care of the patient were reviewed by me and considered in my medical decision making (see chart for details).  74 year old female with COPD former smoker on 2 L of home oxygen presents emergency department tachypneic, tachycardic and in respiratory distress she seems to have been worsening over the past few days. She is difficult to get a full history from given her inability to hear. Communication was limited to writing. She is significantly improved with magnesium, albuterol, Combivent, methylprednisolone. Initially considered placing her on BiPAP however she did improve. We will continue nebulizer treatments. She is negative for Covid.  She does have lower extremity edema however her BNP is 75 and she does not have any JVP although she does have some crackles in her lung I doubt that CHF is causing her symptoms today suspect that she may be having some cor pulmonale from longstanding pulmonary hypertension/COPD. CMP with mild hyponatremia no other significant abnormalities apart from hypochloremia. Magnesium within normal months, BMP mentioned above is within normal limits. Lactic is normal. CBC with very mild elevation WBC likely from physiologic stress due to increased respiratory rate and difficulty breathing. She is afebrile and her tachycardia has significantly improved with treatment. She is also breathing less rapidly.  Patient was assessed by my attending physician at bedside. Agrees my plan to discharge. After discussion about DNR/DNI patient and daughter will follow up with PCP to discuss and document these wishes.  We will  discharge with prednisone 60 for 4 days and every 6 hours nebulizer treatments for the next 2 days. She will follow up tomorrow with PCP. Will need to discuss steroid inhaler use as patient is clearly very poorly controlled with nebulizer treatments alone.  Clinical Course as of 11/08/20 1932  Thu Nov 08, 2020  1612 74 yo female w/ hx of longstanding COPD, former smoker now quit, on 2L [place] home oxygen, presenting to ED with worsening wheezing and SOB for 2 days.  Treated 1 month ago in ED for COPD exacerbation.  She feels her SOB is worsening again.  She also has some new bilateral LE symmetrical pitting edema in her feet.  No prior known hx of CHF.  On exam she has end expiratory wheezing, RR 30, 96% on [place], HR 110 bpm and regular after albuterol treatments.  Now s/p magnesium, pending COVID test and will need duonebs.  Labs also pending for infection w/u and BNP for possible new onset CHF.  Xray shows chronic lung scarring and hyperinflation, no reported significant congestion on this.  Of note, patient is very hard of hearing, essentially deaf, but does read written questions and her daughter is at bedside.  Patient lives with husband at home and has 3 kids nearby that come check on her near-daily.   [MT]  1925 Wheezing improved on reassessment, RR 24 and 98% on 2L [place].  Pt wanting to go home.  Discussed plan of another short burst 4 days prednisone 60 mg, then continuing her 15 mg daily prednisone afterwards.  2 days of q6h albuterol.  And daughter to call PCP office tomorrow to arrange for close follow up assessment, tomorrow or Monday.   [MT]    Clinical Course User Index [MT] Trifan, Kermit Balo, MD   MDM Rules/Calculators/A&P  Final Clinical Impression(s) / ED Diagnoses Final diagnoses:  COPD exacerbation (HCC)    Rx / DC Orders ED Discharge Orders         Ordered    predniSONE (DELTASONE) 10 MG tablet  Daily        11/08/20 1919           Gailen Shelter,  [place] 11/08/20 1946    Terald Sleeper, MD 11/09/20 [id number]

## 2020-11-08 NOTE — ED Notes (Signed)
RT at bedside for breathing tx.

## 2020-11-08 NOTE — ED Notes (Signed)
Patient discharged home to caregiver.  All discharge instructions reviewed.  Patient verbalized understanding via teachback method.  Wheelchair out of ED.

## 2020-11-08 NOTE — Discharge Instructions (Addendum)
Please take 60 mg of prednisone for the next 4 days after that please return back to your normal prednisone dosage.  Please continue to use your at home albuterol nebulizer every 6 hours for the next 2 days including tonight.  Please follow-up your primary care doctor tomorrow.  Please monitor your symptoms.  When you're talking to your primary care doctor is important to bring up the discussion we had about DNR/DNI status.  It is helpful to have these conversations early and have a plan in place.

## 2020-11-08 NOTE — ED Triage Notes (Signed)
Pt c/o shortness of breath, runny nose and swelling of legs. Edema noted to b/l lower extremities. Pt has audible wheezing.

## 2020-11-09 DIAGNOSIS — J449 Chronic obstructive pulmonary disease, unspecified: Secondary | ICD-10-CM | POA: Diagnosis not present

## 2020-11-13 ENCOUNTER — Ambulatory Visit (INDEPENDENT_AMBULATORY_CARE_PROVIDER_SITE_OTHER): Payer: Medicare HMO | Admitting: Family

## 2020-11-13 ENCOUNTER — Encounter: Payer: Self-pay | Admitting: Family

## 2020-11-13 DIAGNOSIS — J42 Unspecified chronic bronchitis: Secondary | ICD-10-CM

## 2020-11-13 DIAGNOSIS — Z09 Encounter for follow-up examination after completed treatment for conditions other than malignant neoplasm: Secondary | ICD-10-CM

## 2020-11-13 DIAGNOSIS — H9193 Unspecified hearing loss, bilateral: Secondary | ICD-10-CM | POA: Diagnosis not present

## 2020-11-13 MED ORDER — BUDESONIDE-FORMOTEROL FUMARATE 160-4.5 MCG/ACT IN AERO: 2.0000 | INHALATION_SPRAY | Freq: Two times a day (BID) | RESPIRATORY_TRACT | 3 refills | Status: DC

## 2020-11-13 NOTE — Progress Notes (Signed)
Virtual Visit via telephone Note Due to COVID-19 pandemic this visit was conducted virtually. This visit type was conducted due to national recommendations for restrictions regarding the COVID-19 Pandemic (e.g. social distancing, sheltering in place) in an effort to limit this patient's exposure and mitigate transmission in our community. All issues noted in this document were discussed and addressed.  A physical exam was not performed with this format.  I connected with Catherine Harrell on 11/13/20 at 1:37 pm  by telephone and verified that I am speaking with the correct person using two identifiers. Catherine Harrell is currently located at home and no one is currently with her during visit. The provider, Jannifer Rodney, FNP is located in their office at time of visit.  I discussed the limitations, risks, security and privacy concerns of performing an evaluation and management service by telephone and the availability of in person appointments. I also discussed with the patient that there may be a patient responsible charge related to this service. The patient expressed understanding and agreed to proceed.   History and Present Illness:   PT calls the office today for hospital follow up. She went to the ED on 11/08/20 for COPD exacerbation. She is a former smoker and is on continuous 2L of oxygen. She was negative for COVID. She was given magnesium, albuterol, combivent, methylprednisolone. She was discharged on prednisone 60 mg for 4 days and nebulizer treatments.  Cough This is a recurrent problem. The current episode started 1 to 4 weeks ago. The problem has been gradually improving. The problem occurs every few minutes. The cough is productive of purulent sputum. Associated symptoms include shortness of breath. Pertinent negatives include no ear congestion, ear pain, fever, myalgias, nasal congestion, postnasal drip, sore throat or wheezing. She has tried rest for the symptoms. The treatment  provided mild relief. Her past medical history is significant for COPD and emphysema.     Review of Systems  Constitutional: Negative for fever.  HENT: Negative for ear pain, postnasal drip and sore throat.   Respiratory: Positive for cough and shortness of breath. Negative for wheezing.   Musculoskeletal: Negative for myalgias.  All other systems reviewed and are negative.    Observations/Objective: No SOB or distress noted, no coughing. Very HOH, however, patient states she can read off her phone.   Assessment and Plan: 1. Hospital discharge follow-up  2. Chronic bronchitis, unspecified chronic bronchitis type (HCC) - budesonide-formoterol (SYMBICORT) 160-4.5 MCG/ACT inhaler; Inhale 2 puffs into the lungs 2 (two) times daily.  Dispense: 1 each; Refill: 3  3. Bilateral hearing loss, unspecified hearing loss type   Keep follow up with PCP Start Symbicort BID Call if symptoms worsen or do not improve       I discussed the assessment and treatment plan with the patient. The patient was provided an opportunity to ask questions and all were answered. The patient agreed with the plan and demonstrated an understanding of the instructions.   The patient was advised to call back or seek an in-person evaluation if the symptoms worsen or if the condition fails to improve as anticipated.  The above assessment and management plan was discussed with the patient. The patient verbalized understanding of and has agreed to the management plan. Patient is aware to call the clinic if symptoms persist or worsen. Patient is aware when to return to the clinic for a follow-up visit. Patient educated on when it is appropriate to go to the emergency department.   Time call ended:  1:59 pm   I provided 22 minutes of non-face-to-face time during this encounter.    Jannifer Rodney, FNP

## 2020-11-14 ENCOUNTER — Telehealth: Payer: Self-pay

## 2020-11-14 ENCOUNTER — Other Ambulatory Visit: Payer: Self-pay | Admitting: Family Medicine

## 2020-11-14 DIAGNOSIS — J449 Chronic obstructive pulmonary disease, unspecified: Secondary | ICD-10-CM

## 2020-11-14 LAB — CULTURE, BLOOD (SINGLE)
Culture: NO GROWTH
Special Requests: ADEQUATE

## 2020-11-14 MED ORDER — SPIRIVA RESPIMAT 2.5 MCG/ACT IN AERS: 2.0000 | INHALATION_SPRAY | Freq: Every day | RESPIRATORY_TRACT | 11 refills | Status: DC

## 2020-11-14 NOTE — Telephone Encounter (Signed)
LMTCB

## 2020-11-14 NOTE — Telephone Encounter (Signed)
I sent in a Spriva inhaler for her to try instead.

## 2020-11-15 ENCOUNTER — Other Ambulatory Visit: Payer: Self-pay | Admitting: Family Medicine

## 2020-11-15 ENCOUNTER — Telehealth: Payer: Self-pay | Admitting: Family Medicine

## 2020-11-15 DIAGNOSIS — J449 Chronic obstructive pulmonary disease, unspecified: Secondary | ICD-10-CM

## 2020-11-15 NOTE — Telephone Encounter (Signed)
DME order placed for spacer.

## 2020-11-15 NOTE — Telephone Encounter (Signed)
Attempted to contact - NA 

## 2020-11-15 NOTE — Progress Notes (Signed)
d 

## 2020-11-15 NOTE — Telephone Encounter (Signed)
Catherine Harrell was called in symbicort on Monday after a telephone visit appt and said that she told the provider that it causes blisters in her mouth, she is wanting either something else called in or to see if a spacer could be called in. The pharmacy said they would need a prescription for the spacer

## 2020-11-15 NOTE — Telephone Encounter (Signed)
Refer to previous phone note 

## 2020-11-15 NOTE — Telephone Encounter (Signed)
Aware and verbalizes understanding- states that she would also like a spacer for her inhaler sent in for her.  Please advise

## 2020-11-26 ENCOUNTER — Ambulatory Visit: Payer: Medicare HMO | Admitting: Family Medicine

## 2020-11-28 ENCOUNTER — Other Ambulatory Visit: Payer: Self-pay

## 2020-11-28 ENCOUNTER — Ambulatory Visit (INDEPENDENT_AMBULATORY_CARE_PROVIDER_SITE_OTHER): Payer: Medicare HMO | Admitting: Family Medicine

## 2020-11-28 ENCOUNTER — Encounter: Payer: Self-pay | Admitting: Family Medicine

## 2020-11-28 VITALS — BP 138/84 | HR 91 | Temp 97.8°F | Resp 22 | Ht 65.0 in | Wt 139.0 lb

## 2020-11-28 DIAGNOSIS — I10 Essential (primary) hypertension: Secondary | ICD-10-CM

## 2020-11-28 DIAGNOSIS — M810 Age-related osteoporosis without current pathological fracture: Secondary | ICD-10-CM | POA: Diagnosis not present

## 2020-11-28 DIAGNOSIS — N3090 Cystitis, unspecified without hematuria: Secondary | ICD-10-CM | POA: Diagnosis not present

## 2020-11-28 DIAGNOSIS — H9113 Presbycusis, bilateral: Secondary | ICD-10-CM

## 2020-11-28 DIAGNOSIS — J449 Chronic obstructive pulmonary disease, unspecified: Secondary | ICD-10-CM | POA: Diagnosis not present

## 2020-11-28 NOTE — Progress Notes (Signed)
Subjective:  Patient ID: Catherine Harrell, female    DOB: [birth year]  Age: 75 y.o. MRN: [id number]  CC: Follow-up   HPI Tsering Leaman presents for recheck of her COPD.  She is breathing better.  However she remains oxygen dependent.  She is about out of the prednisone.  She cannot function without her oxygen due to dyspnea.  She is worried that h her oxygen tank will run out before she can get home today.  Her hearing is very poor and I have to write my questions and answers for her to read.  Depression screen [facility] 2/9 11/28/2020 10/09/2020 05/02/2020  Decreased Interest 0 0 0  Down, Depressed, Hopeless 0 0 0  PHQ - 2 Score 0 0 0    History Adasyn has a past medical history of COPD (chronic obstructive pulmonary disease) ([place]), Hypertension, Osteoporosis, and TMJ arthralgia.   She has no past surgical history on file.   Her family history includes COPD in her father and sister; Cancer in her daughter and son; Diabetes in her mother and sister; Healthy in her son and son; Heart attack in her sister; Heart disease in her brother, father, and sister; Hypertension in her mother; Kidney disease in her daughter.She reports that she quit smoking about 15 years ago. Her smoking use included cigarettes. She has a 30.00 pack-year smoking history. She has never used smokeless tobacco. She reports that she does not drink alcohol and does not use drugs.    ROS Review of Systems  Constitutional: Positive for fatigue. Negative for activity change, appetite change and fever.  HENT: Positive for hearing loss.   Respiratory: Positive for shortness of breath. Negative for chest tightness.   Cardiovascular: Positive for leg swelling (improving). Negative for chest pain.  Genitourinary: Positive for frequency and urgency.    Objective:  BP 138/84   Pulse 91   Temp 97.8 F (36.6 C) (Temporal)   Resp (!) 22   Ht 5' 5"  (1.651 m)   Wt 139 lb (63 kg)   SpO2 97%   BMI 23.13 kg/m   BP Readings from Last  3 Encounters:  11/28/20 138/84  11/08/20 133/75  10/09/20 (!) 143/78    Wt Readings from Last 3 Encounters:  11/28/20 139 lb (63 kg)  11/08/20 145 lb (65.8 kg)  10/09/20 134 lb (60.8 kg)     Physical Exam Constitutional:      General: She is not in acute distress.    Appearance: She is well-developed.  HENT:     Head: Normocephalic and atraumatic.     Right Ear: Tympanic membrane normal.     Ears:     Comments: Minimal residual hearing Eyes:     Conjunctiva/sclera: Conjunctivae normal.     Pupils: Pupils are equal, round, and reactive to light.  Neck:     Thyroid: No thyromegaly.  Cardiovascular:     Rate and Rhythm: Normal rate and regular rhythm.     Heart sounds: Normal heart sounds. No murmur heard.   Pulmonary:     Effort: Pulmonary effort is normal. No respiratory distress.     Breath sounds: Normal breath sounds. No wheezing or rales.  Abdominal:     General: Bowel sounds are normal. There is no distension.     Palpations: Abdomen is soft.     Tenderness: There is no abdominal tenderness.  Musculoskeletal:        General: Normal range of motion.     Cervical back: Normal range of  motion and neck supple.  Lymphadenopathy:     Cervical: No cervical adenopathy.  Skin:    General: Skin is warm and dry.  Neurological:     Mental Status: She is alert and oriented to person, place, and time.  Psychiatric:        Behavior: Behavior normal.        Thought Content: Thought content normal.        Judgment: Judgment normal.       Assessment & Plan:   Eartha was seen today for follow-up.  Diagnoses and all orders for this visit:  Essential hypertension -     CBC with Differential/Platelet -     CMP14+EGFR  Age-related osteoporosis without current pathological fracture -     CBC with Differential/Platelet -     CMP14+EGFR  Presbycusis of both ears -     CBC with Differential/Platelet -     CMP14+EGFR  End stage COPD ([place]) -     CBC with  Differential/Platelet -     CMP14+EGFR  Cystitis -     CBC with Differential/Platelet -     CMP14+EGFR -     Urine Culture -     Urinalysis       I am having Alizon Machuca maintain her ipratropium-albuterol, cholecalciferol, Calcium 1200, gabapentin, losartan, traMADol-acetaminophen, predniSONE, acetaminophen, ibuprofen, budesonide-formoterol, and Spiriva Respimat.  Allergies as of 11/28/2020      Reactions   Levofloxacin       Medication List       Accurate as of November 28, 2020  2:39 PM. If you have any questions, ask your nurse or doctor.        acetaminophen 325 MG tablet Commonly known as: TYLENOL Take 650 mg by mouth every 6 (six) hours as needed.   budesonide-formoterol 160-4.5 MCG/ACT inhaler Commonly known as: SYMBICORT Inhale 2 puffs into the lungs 2 (two) times daily.   Calcium 1200 1200-1000 MG-UNIT Chew Chew 1 tablet by mouth daily.   cholecalciferol 25 MCG (1000 UNIT) tablet Commonly known as: VITAMIN D3 Take 1,000 Units by mouth daily.   gabapentin 300 MG capsule Commonly known as: NEURONTIN Take 3 capsules (900 mg total) by mouth at bedtime.   ibuprofen 200 MG tablet Commonly known as: ADVIL Take 200 mg by mouth every 6 (six) hours as needed.   ipratropium-albuterol 0.5-2.5 (3) MG/3ML Soln Commonly known as: DUONEB INHALE CONTENTS OF 1 VIAL VIA NEBULIZER EVERY 4 HOURS AS NEEDED What changed: See the new instructions.   losartan 25 MG tablet Commonly known as: COZAAR Take 1 tablet (25 mg total) by mouth daily.   predniSONE 5 MG tablet Commonly known as: DELTASONE Take 3 tablets (15 mg total) by mouth daily with breakfast.   Spiriva Respimat 2.5 MCG/ACT Aers Generic drug: Tiotropium Bromide Monohydrate Inhale 2 puffs into the lungs daily.   traMADol-acetaminophen 37.5-325 MG tablet Commonly known as: Ultracet Take 1 tablet by mouth every 6 (six) hours as needed.        Follow-up: Return in about 3 months (around 02/26/2021), or if  symptoms worsen or fail to improve, for COPD.  Claretta Fraise, M.D.

## 2020-11-29 ENCOUNTER — Telehealth: Payer: Self-pay

## 2020-11-29 LAB — CBC WITH DIFFERENTIAL/PLATELET
Basophils Absolute: 0 10*3/uL (ref 0.0–0.2)
Basos: 0 %
EOS (ABSOLUTE): 0 10*3/uL (ref 0.0–0.4)
Eos: 1 %
Hematocrit: 40 % (ref 34.0–46.6)
Hemoglobin: 13.1 g/dL (ref 11.1–15.9)
Immature Grans (Abs): 0.1 10*3/uL (ref 0.0–0.1)
Immature Granulocytes: 1 %
Lymphocytes Absolute: 0.7 10*3/uL (ref 0.7–3.1)
Lymphs: 8 %
MCH: 28.5 pg (ref 26.6–33.0)
MCHC: 32.8 g/dL (ref 31.5–35.7)
MCV: 87 fL (ref 79–97)
Monocytes Absolute: 0.3 10*3/uL (ref 0.1–0.9)
Monocytes: 3 %
Neutrophils Absolute: 7.7 10*3/uL — ABNORMAL HIGH (ref 1.4–7.0)
Neutrophils: 87 %
Platelets: 440 10*3/uL (ref 150–450)
RBC: 4.59 x10E6/uL (ref 3.77–5.28)
RDW: 11.6 % — ABNORMAL LOW (ref 11.7–15.4)
WBC: 8.8 10*3/uL (ref 3.4–10.8)

## 2020-11-29 LAB — CMP14+EGFR
ALT: 12 IU/L (ref 0–32)
AST: 11 IU/L (ref 0–40)
Albumin/Globulin Ratio: 1.4 (ref 1.2–2.2)
Albumin: 3.9 g/dL (ref 3.7–4.7)
Alkaline Phosphatase: 79 IU/L (ref 44–121)
BUN/Creatinine Ratio: 12 (ref 12–28)
BUN: 10 mg/dL (ref 8–27)
Bilirubin Total: 0.3 mg/dL (ref 0.0–1.2)
CO2: 31 mmol/L — ABNORMAL HIGH (ref 20–29)
Calcium: 10 mg/dL (ref 8.7–10.3)
Chloride: 91 mmol/L — ABNORMAL LOW (ref 96–106)
Creatinine, Ser: 0.82 mg/dL (ref 0.57–1.00)
GFR calc Af Amer: 81 mL/min/{1.73_m2} (ref >59)
GFR calc non Af Amer: 70 mL/min/{1.73_m2} (ref >59)
Globulin, Total: 2.8 g/dL (ref 1.5–4.5)
Glucose: 111 mg/dL — ABNORMAL HIGH (ref 65–99)
Potassium: 5.2 mmol/L (ref 3.5–5.2)
Sodium: 134 mmol/L (ref 134–144)
Total Protein: 6.7 g/dL (ref 6.0–8.5)

## 2020-11-29 MED ORDER — GABAPENTIN 300 MG PO CAPS: 900.0000 mg | ORAL_CAPSULE | Freq: Every day | ORAL | 0 refills | Status: DC

## 2020-11-29 NOTE — Telephone Encounter (Signed)
Patient informed that refill will be sent in.

## 2020-11-29 NOTE — Telephone Encounter (Signed)
Pt called stating that she had an appt with Dr Darlyn Read yesterday and he told pt that he would send in refills for her Gabapentin Rx but has not sent in refills.  Needs refills sent to [facility] in [place].

## 2020-11-29 NOTE — Progress Notes (Signed)
Hello Sharanya,  Your lab result is normal and/or stable.Some minor variations that are not significant are commonly marked abnormal, but do not represent any medical problem for you.  Best regards, [name], M.D.

## 2020-12-10 DIAGNOSIS — J449 Chronic obstructive pulmonary disease, unspecified: Secondary | ICD-10-CM | POA: Diagnosis not present

## 2020-12-25 ENCOUNTER — Ambulatory Visit: Payer: Medicare HMO | Admitting: Family Medicine

## 2020-12-25 ENCOUNTER — Ambulatory Visit: Payer: Medicare HMO | Admitting: Nurse Practitioner

## 2020-12-27 ENCOUNTER — Other Ambulatory Visit: Payer: Self-pay | Admitting: Family Medicine

## 2020-12-27 ENCOUNTER — Telehealth: Payer: Self-pay

## 2020-12-27 DIAGNOSIS — J431 Panlobular emphysema: Secondary | ICD-10-CM

## 2020-12-27 NOTE — Telephone Encounter (Signed)
Done. Thanks, WS 

## 2020-12-27 NOTE — Telephone Encounter (Signed)
Pt called stating that her Nebulizer machine stopped working and she needs Dr Darlyn Read to send and order to [facility] so that she can get a new one.  Please advise and call patient.

## 2021-01-01 ENCOUNTER — Other Ambulatory Visit: Payer: Self-pay

## 2021-01-01 ENCOUNTER — Ambulatory Visit (INDEPENDENT_AMBULATORY_CARE_PROVIDER_SITE_OTHER): Payer: Medicare HMO | Admitting: Family Medicine

## 2021-01-01 ENCOUNTER — Encounter: Payer: Self-pay | Admitting: Family Medicine

## 2021-01-01 VITALS — BP 153/76 | HR 118 | Temp 97.6°F | Ht 65.0 in | Wt 125.0 lb

## 2021-01-01 DIAGNOSIS — R8290 Unspecified abnormal findings in urine: Secondary | ICD-10-CM | POA: Diagnosis not present

## 2021-01-01 DIAGNOSIS — R3129 Other microscopic hematuria: Secondary | ICD-10-CM | POA: Diagnosis not present

## 2021-01-01 LAB — URINALYSIS, COMPLETE
Bilirubin, UA: NEGATIVE
Glucose, UA: NEGATIVE
Ketones, UA: NEGATIVE
Leukocytes,UA: NEGATIVE
Nitrite, UA: NEGATIVE
Protein,UA: NEGATIVE
Specific Gravity, UA: 1.01 (ref 1.005–1.030)
Urobilinogen, Ur: 0.2 mg/dL (ref 0.2–1.0)
pH, UA: 6.5 (ref 5.0–7.5)

## 2021-01-01 LAB — MICROSCOPIC EXAMINATION

## 2021-01-01 NOTE — Progress Notes (Signed)
Assessment & Plan:  1. Microscopic hematuria - Ambulatory referral to Urology  2. Abnormal urine odor - Urinalysis, Complete - Urine Culture   Follow up plan: Return if symptoms worsen or fail to improve.  [name], MSN, APRN, FNP-C [facility]  Subjective:   Patient ID: Catherine Harrell, female    DOB: September 28, 1946, 75 y.o.   MRN: [id number]  HPI: Catherine Harrell is a 75 y.o. female presenting on 01/01/2021 for urinary odor (Patient states it has been going on since christmas but has gotten better./)  Patient reports her urine has had an odor since Christmas.  She does report that she goes to the bathroom frequently and cannot always make it.  She denies any burning, fever, suprapubic tenderness, or worsening back pain.  She reports she does drink a lot of water.  She reports overall her symptoms are better than they were.   ROS: Negative unless specifically indicated above in HPI.   Relevant past medical history reviewed and updated as indicated.   Allergies and medications reviewed and updated.   Current Outpatient Medications:  .  acetaminophen (TYLENOL) 325 MG tablet, Take 650 mg by mouth every 6 (six) hours as needed., Disp: , Rfl:  .  budesonide-formoterol (SYMBICORT) 160-4.5 MCG/ACT inhaler, Inhale 2 puffs into the lungs 2 (two) times daily., Disp: 1 each, Rfl: 3 .  Calcium Carbonate-Vit D-Min (CALCIUM 1200) 1200-1000 MG-UNIT CHEW, Chew 1 tablet by mouth daily., Disp: 30 tablet, Rfl: 3 .  cholecalciferol (VITAMIN D3) 25 MCG (1000 UNIT) tablet, Take 1,000 Units by mouth daily., Disp: , Rfl:  .  gabapentin (NEURONTIN) 300 MG capsule, Take 3 capsules (900 mg total) by mouth at bedtime., Disp: 270 capsule, Rfl: 0 .  ibuprofen (ADVIL) 200 MG tablet, Take 200 mg by mouth every 6 (six) hours as needed., Disp: , Rfl:  .  ipratropium-albuterol (DUONEB) 0.5-2.5 (3) MG/3ML SOLN, INHALE CONTENTS OF 1 VIAL VIA NEBULIZER EVERY 4 HOURS AS NEEDED (Patient taking  differently: 3 mLs every 4 (four) hours as needed. INHALE CONTENTS OF 1 VIAL VIA NEBULIZER EVERY 4 HOURS AS NEEDED), Disp: 360 mL, Rfl: 2 .  losartan (COZAAR) 25 MG tablet, Take 1 tablet (25 mg total) by mouth daily., Disp: 90 tablet, Rfl: 1 .  predniSONE (DELTASONE) 5 MG tablet, Take 3 tablets (15 mg total) by mouth daily with breakfast., Disp: 90 tablet, Rfl: 0 .  Tiotropium Bromide Monohydrate (SPIRIVA RESPIMAT) 2.5 MCG/ACT AERS, Inhale 2 puffs into the lungs daily., Disp: 4 g, Rfl: 11 .  traMADol-acetaminophen (ULTRACET) 37.5-325 MG tablet, Take 1 tablet by mouth every 6 (six) hours as needed., Disp: 30 tablet, Rfl: 0  Allergies  Allergen Reactions  . Levofloxacin     Objective:   BP (!) 153/76   Pulse (!) 118   Temp 97.6 F (36.4 C) (Temporal)   Ht 5\' 5"  (1.651 m)   Wt 125 lb (56.7 kg)   SpO2 98%   BMI 20.80 kg/m    Physical Exam Vitals reviewed.  Constitutional:      General: She is not in acute distress.    Appearance: Normal appearance. She is not ill-appearing, toxic-appearing or diaphoretic.  HENT:     Head: Normocephalic and atraumatic.  Eyes:     General: No scleral icterus.       Right eye: No discharge.        Left eye: No discharge.     Conjunctiva/sclera: Conjunctivae normal.  Cardiovascular:     Rate  and Rhythm: Normal rate.  Pulmonary:     Effort: Pulmonary effort is normal. No respiratory distress.  Musculoskeletal:        General: Normal range of motion.     Cervical back: Normal range of motion.  Skin:    General: Skin is warm and dry.     Capillary Refill: Capillary refill takes less than 2 seconds.  Neurological:     General: No focal deficit present.     Mental Status: She is alert and oriented to person, place, and time. Mental status is at baseline.  Psychiatric:        Mood and Affect: Mood normal.        Behavior: Behavior normal.        Thought Content: Thought content normal.        Judgment: Judgment normal.

## 2021-01-03 LAB — URINE CULTURE

## 2021-01-05 ENCOUNTER — Other Ambulatory Visit: Payer: Self-pay | Admitting: Family Medicine

## 2021-01-05 DIAGNOSIS — J411 Mucopurulent chronic bronchitis: Secondary | ICD-10-CM

## 2021-01-07 ENCOUNTER — Ambulatory Visit: Payer: Medicare HMO | Admitting: Nurse Practitioner

## 2021-01-08 ENCOUNTER — Telehealth: Payer: Self-pay

## 2021-01-08 NOTE — Telephone Encounter (Signed)
  Prescription Request  01/08/2021  What is the name of the medication or equipment? predniSONE (DELTASONE) 5 MG tablet    Have you contacted your pharmacy to request a refill? (if applicable) yes  Which pharmacy would you like this sent to? [facility]    Patient notified that their request is being sent to the clinical staff for review and that they should receive a response within 2 business days.

## 2021-01-08 NOTE — Telephone Encounter (Signed)
Is this a chronic med for her?

## 2021-01-08 NOTE — Telephone Encounter (Signed)
yes

## 2021-01-09 ENCOUNTER — Other Ambulatory Visit: Payer: Self-pay | Admitting: Family Medicine

## 2021-01-09 ENCOUNTER — Other Ambulatory Visit: Payer: Self-pay | Admitting: *Deleted

## 2021-01-09 DIAGNOSIS — J411 Mucopurulent chronic bronchitis: Secondary | ICD-10-CM

## 2021-01-09 MED ORDER — PREDNISONE 5 MG PO TABS: 15.0000 mg | ORAL_TABLET | Freq: Every day | ORAL | 0 refills | Status: DC

## 2021-01-09 NOTE — Telephone Encounter (Signed)
Pts daughter called regarding refill request on pt's Prednisone Rx. Rx was last called in on 11/02/20 for 30 day supply and was last seen by Dr Darlyn Read on 11/28/20 for check up.  [facility]

## 2021-01-09 NOTE — Telephone Encounter (Signed)
Medication refilled

## 2021-01-10 DIAGNOSIS — J449 Chronic obstructive pulmonary disease, unspecified: Secondary | ICD-10-CM | POA: Diagnosis not present

## 2021-01-10 NOTE — Telephone Encounter (Signed)
Aware refill sent to pharmacy ?

## 2021-02-07 DIAGNOSIS — J449 Chronic obstructive pulmonary disease, unspecified: Secondary | ICD-10-CM | POA: Diagnosis not present

## 2021-02-18 ENCOUNTER — Ambulatory Visit: Payer: Medicare HMO | Admitting: Urology

## 2021-02-20 ENCOUNTER — Other Ambulatory Visit: Payer: Self-pay

## 2021-02-20 ENCOUNTER — Encounter: Payer: Self-pay | Admitting: Urology

## 2021-02-20 ENCOUNTER — Ambulatory Visit (INDEPENDENT_AMBULATORY_CARE_PROVIDER_SITE_OTHER): Payer: Medicare HMO | Admitting: Urology

## 2021-02-20 VITALS — BP 169/81 | HR 114 | Temp 98.9°F | Ht 65.0 in | Wt 125.0 lb

## 2021-02-20 DIAGNOSIS — R310 Gross hematuria: Secondary | ICD-10-CM

## 2021-02-20 LAB — URINALYSIS, ROUTINE W REFLEX MICROSCOPIC
Bilirubin, UA: NEGATIVE
Glucose, UA: NEGATIVE
Ketones, UA: NEGATIVE
Nitrite, UA: NEGATIVE
Protein,UA: NEGATIVE
Specific Gravity, UA: 1.01 (ref 1.005–1.030)
Urobilinogen, Ur: 0.2 mg/dL (ref 0.2–1.0)
pH, UA: 6.5 (ref 5.0–7.5)

## 2021-02-20 LAB — MICROSCOPIC EXAMINATION
Bacteria, UA: NONE SEEN
Renal Epithel, UA: NONE SEEN /hpf

## 2021-02-20 NOTE — Patient Instructions (Signed)

## 2021-02-20 NOTE — Progress Notes (Signed)
02/20/2021 2:52 PM   Catherine Harrell Dec 12, 1945 [id number]  Referring provider: [name], MD [address] [place],  [place] [address]  microhematuria  HPI: Catherine Harrell is a 88FY here for evaluation of microhematuria. She has had 2 UAs in the past 3 months that have shown trace blood but microscopy showed 0-2 RBCs/hpf. NO exposure risks. NO tobacco abuse history. She denies any significant LUTS.    PMH: Past Medical History:  Diagnosis Date  . COPD (chronic obstructive pulmonary disease) ([facility])   . Hypertension   . Osteoporosis   . TMJ arthralgia    left    Surgical History: No past surgical history on file.  Home Medications:  Allergies as of 02/20/2021      Reactions   Levofloxacin       Medication List       Accurate as of February 20, 2021  2:52 PM. If you have any questions, ask your nurse or doctor.        acetaminophen 325 MG tablet Commonly known as: TYLENOL Take 650 mg by mouth every 6 (six) hours as needed.   budesonide-formoterol 160-4.5 MCG/ACT inhaler Commonly known as: SYMBICORT Inhale 2 puffs into the lungs 2 (two) times daily.   Calcium 1200 1200-1000 MG-UNIT Chew Chew 1 tablet by mouth daily.   cholecalciferol 25 MCG (1000 UNIT) tablet Commonly known as: VITAMIN D3 Take 1,000 Units by mouth daily.   gabapentin 300 MG capsule Commonly known as: NEURONTIN Take 3 capsules (900 mg total) by mouth at bedtime.   ibuprofen 200 MG tablet Commonly known as: ADVIL Take 200 mg by mouth every 6 (six) hours as needed.   ipratropium-albuterol 0.5-2.5 (3) MG/3ML Soln Commonly known as: DUONEB INHALE CONTENTS OF 1 VIAL VIA NEBULIZER EVERY 4 HOURS AS NEEDED What changed: See the new instructions.   losartan 25 MG tablet Commonly known as: COZAAR Take 1 tablet (25 mg total) by mouth daily.   predniSONE 5 MG tablet Commonly known as: DELTASONE TAKE 3 TABLETS (15 MG TOTAL) BY MOUTH DAILY WITH BREAKFAST.   predniSONE 5 MG tablet Commonly known as:  DELTASONE Take 3 tablets (15 mg total) by mouth daily with breakfast.   Spiriva Respimat 2.5 MCG/ACT Aers Generic drug: Tiotropium Bromide Monohydrate Inhale 2 puffs into the lungs daily.   traMADol-acetaminophen 37.5-325 MG tablet Commonly known as: Ultracet Take 1 tablet by mouth every 6 (six) hours as needed.       Allergies:  Allergies  Allergen Reactions  . Levofloxacin     Family History: Family History  Problem Relation Age of Onset  . Heart disease Father   . COPD Father   . Heart disease Sister   . Heart attack Sister   . Diabetes Sister   . COPD Sister   . Heart disease Brother   . Cancer Daughter        breast  . Hypertension Mother   . Diabetes Mother   . Healthy Son   . Cancer Son        Prostate   . Kidney disease Daughter   . Healthy Son     Social History:  reports that she quit smoking about 16 years ago. Her smoking use included cigarettes. She has a 30.00 pack-year smoking history. She has never used smokeless tobacco. She reports that she does not drink alcohol and does not use drugs.  ROS: All other review of systems were reviewed and are negative except what is noted above in HPI  Physical Exam: BP (!) 169/81   Pulse (!) 114   Temp 98.9 F (37.2 C) (Oral)   Ht 5\' 5"  (1.651 m)   Wt 125 lb (56.7 kg)   BMI 20.80 kg/m   Constitutional:  Alert and oriented, No acute distress. HEENT: [place] AT, moist mucus membranes.  Trachea midline, no masses. Cardiovascular: No clubbing, cyanosis, or edema. Respiratory: Normal respiratory effort, no increased work of breathing. GI: Abdomen is soft, nontender, nondistended, no abdominal masses GU: No CVA tenderness.  Lymph: No cervical or inguinal lymphadenopathy. Skin: No rashes, bruises or suspicious lesions. Neurologic: Grossly intact, no focal deficits, moving all 4 extremities. Psychiatric: Normal mood and affect.  Laboratory Data: Lab Results  Component Value Date   WBC 8.8 11/28/2020   HGB  13.1 11/28/2020   HCT 40.0 11/28/2020   MCV 87 11/28/2020   PLT 440 11/28/2020    Lab Results  Component Value Date   CREATININE 0.82 11/28/2020    No results found for: PSA  No results found for: TESTOSTERONE  No results found for: HGBA1C  Urinalysis    Component Value Date/Time   COLORURINE STRAW (A) 09/24/2020 1639   APPEARANCEUR Clear 01/01/2021 1346   LABSPEC 1.006 09/24/2020 1639   PHURINE 6.0 09/24/2020 1639   GLUCOSEU Negative 01/01/2021 1346   HGBUR SMALL (A) 09/24/2020 1639   BILIRUBINUR Negative 01/01/2021 1346   KETONESUR NEGATIVE 09/24/2020 1639   PROTEINUR Negative 01/01/2021 1346   PROTEINUR NEGATIVE 09/24/2020 1639   UROBILINOGEN negative 12/19/2015 1720   NITRITE Negative 01/01/2021 1346   NITRITE NEGATIVE 09/24/2020 1639   LEUKOCYTESUR Negative 01/01/2021 1346   LEUKOCYTESUR NEGATIVE 09/24/2020 1639    Lab Results  Component Value Date   LABMICR See below: 01/01/2021   WBCUA 0-5 01/01/2021   RBCUA 0-2 03/04/2016   LABEPIT 0-10 01/01/2021   BACTERIA Few 01/01/2021    Pertinent Imaging:  No results found for this or any previous visit.  No results found for this or any previous visit.  No results found for this or any previous visit.  No results found for this or any previous visit.  No results found for this or any previous visit.  No results found for this or any previous visit.  No results found for this or any previous visit.  No results found for this or any previous visit.   Assessment & Plan:    1. micro hematuria -We discussed the natural history of microhematuria and the various causes. We discussed the workup of microhematuria including abdominal imaging and cystoscopy. Since her urine microscopy failed to show more that 2 RBCs/hpf we will defer hematuria workup at this time. of Followup 6 months with UA   No follow-ups on file.  03/01/2021, MD  [facility] [place]

## 2021-02-20 NOTE — Progress Notes (Signed)
Urological Symptom Review  Patient is experiencing the following symptoms: Frequent urination Hard to postpone urine Getting up to urinate at night Blood in urine   Review of Systems  Gastrointestinal (upper)  : Negative for upper GI symptoms  Gastrointestinal (lower) : Negative for lower GI symptoms  Constitutional : Negative for symptoms  Skin: Negative for skin symptoms  Eyes: Negative for eye symptoms  Ear/Nose/Throat : Negative for Ear/Nose/Throat symptoms  Hematologic/Lymphatic: Negative for Hematologic/Lymphatic symptoms  Cardiovascular : Negative for cardiovascular symptoms  Respiratory : Negative for respiratory symptoms  Endocrine: Negative for endocrine symptoms  Musculoskeletal: Negative for musculoskeletal symptoms  Neurological: Negative for neurological symptoms  Psychologic: Negative for psychiatric symptoms

## 2021-02-26 ENCOUNTER — Ambulatory Visit: Payer: Medicare HMO | Admitting: Family Medicine

## 2021-03-02 ENCOUNTER — Encounter: Payer: Self-pay | Admitting: Urology

## 2021-03-10 DIAGNOSIS — J449 Chronic obstructive pulmonary disease, unspecified: Secondary | ICD-10-CM | POA: Diagnosis not present

## 2021-03-11 ENCOUNTER — Ambulatory Visit: Payer: Medicare HMO | Admitting: Family Medicine

## 2021-03-14 ENCOUNTER — Encounter: Payer: Self-pay | Admitting: Family Medicine

## 2021-03-14 ENCOUNTER — Ambulatory Visit (INDEPENDENT_AMBULATORY_CARE_PROVIDER_SITE_OTHER): Payer: Medicare HMO | Admitting: Family Medicine

## 2021-03-14 ENCOUNTER — Other Ambulatory Visit: Payer: Self-pay

## 2021-03-14 VITALS — BP 136/76 | HR 89 | Temp 97.1°F | Ht 65.0 in | Wt 125.0 lb

## 2021-03-14 DIAGNOSIS — I10 Essential (primary) hypertension: Secondary | ICD-10-CM

## 2021-03-14 DIAGNOSIS — J42 Unspecified chronic bronchitis: Secondary | ICD-10-CM | POA: Diagnosis not present

## 2021-03-14 DIAGNOSIS — H9113 Presbycusis, bilateral: Secondary | ICD-10-CM | POA: Diagnosis not present

## 2021-03-14 DIAGNOSIS — Z0289 Encounter for other administrative examinations: Secondary | ICD-10-CM

## 2021-03-14 DIAGNOSIS — J449 Chronic obstructive pulmonary disease, unspecified: Secondary | ICD-10-CM

## 2021-03-14 MED ORDER — IPRATROPIUM-ALBUTEROL 0.5-2.5 (3) MG/3ML IN SOLN: 3.0000 mL | RESPIRATORY_TRACT | 3 refills | Status: AC | PRN

## 2021-03-14 MED ORDER — PREDNISONE 20 MG PO TABS: 20.0000 mg | ORAL_TABLET | Freq: Two times a day (BID) | ORAL | 0 refills | Status: AC

## 2021-03-14 MED ORDER — LOSARTAN POTASSIUM 25 MG PO TABS: 25.0000 mg | ORAL_TABLET | Freq: Every day | ORAL | 1 refills | Status: DC

## 2021-03-14 MED ORDER — BUDESONIDE-FORMOTEROL FUMARATE 160-4.5 MCG/ACT IN AERO: 2.0000 | INHALATION_SPRAY | Freq: Two times a day (BID) | RESPIRATORY_TRACT | 3 refills | Status: DC

## 2021-03-14 MED ORDER — GABAPENTIN 300 MG PO CAPS: 900.0000 mg | ORAL_CAPSULE | Freq: Every day | ORAL | 0 refills | Status: DC

## 2021-03-18 ENCOUNTER — Encounter: Payer: Self-pay | Admitting: Family Medicine

## 2021-03-18 NOTE — Progress Notes (Signed)
Subjective:  Patient ID: Catherine Harrell, female    DOB: 1946-10-29  Age: 75 y.o. MRN: [id number]  CC: Medical Management of Chronic Issues   HPI Catherine Harrell presents for  follow-up of hypertension. Patient has no history of headache chest pain or shortness of breath or recent cough. Patient also denies symptoms of TIA such as focal numbness or weakness. Patient denies side effects from medication. States taking it regularly.  Pt. Understands speech only when spoken loudly within 6 inches of her ear.   She has a chronic cough. No purulent sputum. She is dyspneic with walking short distances:  mMRC level 3.    History Catherine Harrell has a past medical history of COPD (chronic obstructive pulmonary disease) (HCC), Hypertension, Osteoporosis, and TMJ arthralgia.   She has no past surgical history on file.   Her family history includes COPD in her father and sister; Cancer in her daughter and son; Diabetes in her mother and sister; Healthy in her son and son; Heart attack in her sister; Heart disease in her brother, father, and sister; Hypertension in her mother; Kidney disease in her daughter.She reports that she quit smoking about 16 years ago. Her smoking use included cigarettes. She has a 30.00 pack-year smoking history. She has never used smokeless tobacco. She reports that she does not drink alcohol and does not use drugs.  Current Outpatient Medications on File Prior to Visit  Medication Sig Dispense Refill  . acetaminophen (TYLENOL) 325 MG tablet Take 650 mg by mouth every 6 (six) hours as needed.    . Calcium Carbonate-Vit D-Min (CALCIUM 1200) 1200-1000 MG-UNIT CHEW Chew 1 tablet by mouth daily. 30 tablet 3  . cholecalciferol (VITAMIN D3) 25 MCG (1000 UNIT) tablet Take 1,000 Units by mouth daily.    . predniSONE (DELTASONE) 5 MG tablet TAKE 3 TABLETS (15 MG TOTAL) BY MOUTH DAILY WITH BREAKFAST. 90 tablet 5  . Tiotropium Bromide Monohydrate (SPIRIVA RESPIMAT) 2.5 MCG/ACT AERS Inhale 2  puffs into the lungs daily. 4 g 11   No current facility-administered medications on file prior to visit.    ROS Review of Systems  Unable to perform ROS: Other (deafness and dyspnea)    Objective:  BP 136/76   Pulse 89   Temp (!) 97.1 F (36.2 C) (Temporal)   Ht 5\' 5"  (1.651 m)   Wt 125 lb (56.7 kg)   SpO2 97%   BMI 20.80 kg/m   BP Readings from Last 3 Encounters:  03/14/21 136/76  02/20/21 (!) 169/81  01/01/21 (!) 153/76    Wt Readings from Last 3 Encounters:  03/14/21 125 lb (56.7 kg)  02/20/21 125 lb (56.7 kg)  01/01/21 125 lb (56.7 kg)     Physical Exam Constitutional:      General: She is not in acute distress.    Appearance: She is well-developed.  HENT:     Head: Normocephalic and atraumatic.  Eyes:     Conjunctiva/sclera: Conjunctivae normal.     Pupils: Pupils are equal, round, and reactive to light.  Neck:     Thyroid: No thyromegaly.  Cardiovascular:     Rate and Rhythm: Normal rate and regular rhythm.     Heart sounds: Normal heart sounds. No murmur heard.   Pulmonary:     Effort: Pulmonary effort is normal. No respiratory distress.     Breath sounds: Normal breath sounds. No wheezing or rales.     Comments: diminshed respiratory exchange. Distant with decreased expiratory phase 1:1 with respiration Abdominal:  General: Bowel sounds are normal. There is no distension.     Palpations: Abdomen is soft.     Tenderness: There is no abdominal tenderness.  Musculoskeletal:        General: Normal range of motion.     Cervical back: Normal range of motion and neck supple.  Lymphadenopathy:     Cervical: No cervical adenopathy.  Skin:    General: Skin is warm and dry.  Neurological:     Mental Status: She is alert and oriented to person, place, and time.  Psychiatric:        Behavior: Behavior normal.        Thought Content: Thought content normal.        Judgment: Judgment normal.       Assessment & Plan:   Catherine Harrell was seen today  for medical management of chronic issues.  Diagnoses and all orders for this visit:  Chronic bronchitis, unspecified chronic bronchitis type (HCC) -     budesonide-formoterol (SYMBICORT) 160-4.5 MCG/ACT inhaler; Inhale 2 puffs into the lungs 2 (two) times daily.  Medication management contract signed -     Cancel: ToxASSURE Select 13 (MW), Urine  Presbycusis of both ears  End stage COPD (HCC)  Essential hypertension  Other orders -     predniSONE (DELTASONE) 20 MG tablet; Take 1 tablet (20 mg total) by mouth 2 (two) times daily with a meal for 7 days. -     losartan (COZAAR) 25 MG tablet; Take 1 tablet (25 mg total) by mouth daily. -     ipratropium-albuterol (DUONEB) 0.5-2.5 (3) MG/3ML SOLN; Take 3 mLs by nebulization every 4 (four) hours as needed. INHALE CONTENTS OF 1 VIAL VIA NEBULIZER EVERY 4 HOURS AS NEEDED -     gabapentin (NEURONTIN) 300 MG capsule; Take 3 capsules (900 mg total) by mouth at bedtime.   Allergies as of 03/14/2021      Reactions   Levofloxacin       Medication List       Accurate as of March 14, 2021 11:59 PM. If you have any questions, ask your nurse or doctor.        STOP taking these medications   ibuprofen 200 MG tablet Commonly known as: ADVIL Stopped by: [name], MD   traMADol-acetaminophen 37.5-325 MG tablet Commonly known as: Ultracet Stopped by: [name], MD     TAKE these medications   acetaminophen 325 MG tablet Commonly known as: TYLENOL Take 650 mg by mouth every 6 (six) hours as needed.   budesonide-formoterol 160-4.5 MCG/ACT inhaler Commonly known as: SYMBICORT Inhale 2 puffs into the lungs 2 (two) times daily.   Calcium 1200 1200-1000 MG-UNIT Chew Chew 1 tablet by mouth daily.   cholecalciferol 25 MCG (1000 UNIT) tablet Commonly known as: VITAMIN D3 Take 1,000 Units by mouth daily.   gabapentin 300 MG capsule Commonly known as: NEURONTIN Take 3 capsules (900 mg total) by mouth at bedtime.    ipratropium-albuterol 0.5-2.5 (3) MG/3ML Soln Commonly known as: DUONEB Take 3 mLs by nebulization every 4 (four) hours as needed. INHALE CONTENTS OF 1 VIAL VIA NEBULIZER EVERY 4 HOURS AS NEEDED What changed: See the new instructions. Changed by: [name], MD   losartan 25 MG tablet Commonly known as: COZAAR Take 1 tablet (25 mg total) by mouth daily.   predniSONE 5 MG tablet Commonly known as: DELTASONE TAKE 3 TABLETS (15 MG TOTAL) BY MOUTH DAILY WITH BREAKFAST. What changed: Another medication with the same name  was changed. Make sure you understand how and when to take each. Changed by: [name], MD   predniSONE 20 MG tablet Commonly known as: DELTASONE Take 1 tablet (20 mg total) by mouth 2 (two) times daily with a meal for 7 days. What changed:   medication strength  how much to take  when to take this Changed by: [name], MD   Spiriva Respimat 2.5 MCG/ACT Aers Generic drug: Tiotropium Bromide Monohydrate Inhale 2 puffs into the lungs daily.       Meds ordered this encounter  Medications  . predniSONE (DELTASONE) 20 MG tablet    Sig: Take 1 tablet (20 mg total) by mouth 2 (two) times daily with a meal for 7 days.    Dispense:  14 tablet    Refill:  0  . losartan (COZAAR) 25 MG tablet    Sig: Take 1 tablet (25 mg total) by mouth daily.    Dispense:  90 tablet    Refill:  1  . ipratropium-albuterol (DUONEB) 0.5-2.5 (3) MG/3ML SOLN    Sig: Take 3 mLs by nebulization every 4 (four) hours as needed. INHALE CONTENTS OF 1 VIAL VIA NEBULIZER EVERY 4 HOURS AS NEEDED    Dispense:  1080 mL    Refill:  3  . gabapentin (NEURONTIN) 300 MG capsule    Sig: Take 3 capsules (900 mg total) by mouth at bedtime.    Dispense:  270 capsule    Refill:  0  . budesonide-formoterol (SYMBICORT) 160-4.5 MCG/ACT inhaler    Sig: Inhale 2 puffs into the lungs 2 (two) times daily.    Dispense:  1 each    Refill:  3      Follow-up: Return in about 3 months (around  06/13/2021) for COPD.  [name], M.D.

## 2021-04-09 DIAGNOSIS — J449 Chronic obstructive pulmonary disease, unspecified: Secondary | ICD-10-CM | POA: Diagnosis not present

## 2021-04-15 ENCOUNTER — Telehealth: Payer: Self-pay | Admitting: Family Medicine

## 2021-04-15 DIAGNOSIS — J449 Chronic obstructive pulmonary disease, unspecified: Secondary | ICD-10-CM

## 2021-04-15 NOTE — Telephone Encounter (Signed)
Pt asking for an order for portable oxygen tank. I kept asking which pharmacy to send it. She would not say. Then she said someone name Annette Stable called her. I let her know we did not have someone here with that name. Call dropped.

## 2021-04-17 NOTE — Telephone Encounter (Signed)
Please sign pending order, I will send message onto [facility], last OV in April may have enough documentation, if not aware may need to be seen

## 2021-04-17 NOTE — Telephone Encounter (Signed)
  Prescription Request  04/17/2021  What is the name of the medication or equipment? Pt needs portable oxygen tank rx  Have you contacted your pharmacy to request a refill? (if applicable) yes  Which pharmacy would you like this sent to? [facility]   Patient notified that their request is being sent to the clinical staff for review and that they should receive a response within 2 business days.

## 2021-04-18 NOTE — Telephone Encounter (Signed)
Community message sent to [facility].

## 2021-04-23 NOTE — Telephone Encounter (Signed)
Daughter aware that information was sent to [facility].

## 2021-05-10 DIAGNOSIS — J449 Chronic obstructive pulmonary disease, unspecified: Secondary | ICD-10-CM | POA: Diagnosis not present

## 2021-06-09 DIAGNOSIS — J449 Chronic obstructive pulmonary disease, unspecified: Secondary | ICD-10-CM | POA: Diagnosis not present

## 2021-06-14 ENCOUNTER — Other Ambulatory Visit: Payer: Self-pay | Admitting: Nurse Practitioner

## 2021-06-14 ENCOUNTER — Telehealth: Payer: Self-pay | Admitting: Family Medicine

## 2021-06-14 MED ORDER — GABAPENTIN 300 MG PO CAPS: 900.0000 mg | ORAL_CAPSULE | Freq: Every day | ORAL | 0 refills | Status: DC

## 2021-06-14 NOTE — Telephone Encounter (Signed)
Last office visit 03/14/21 Last refill 03/14/21, #[phone], no refills

## 2021-06-14 NOTE — Telephone Encounter (Signed)
  Prescription Request  06/14/2021  What is the name of the medication or equipment? gabapentin (NEURONTIN) 300 MG capsule   Have you contacted your pharmacy to request a refill? (if applicable) yes  Which pharmacy would you like this sent to? [facility]    Patient notified that their request is being sent to the clinical staff for review and that they should receive a response within 2 business days.

## 2021-06-14 NOTE — Telephone Encounter (Signed)
Patient aware and verbalized understanding. °

## 2021-06-14 NOTE — Telephone Encounter (Signed)
Neurontin refilled X1

## 2021-07-08 ENCOUNTER — Other Ambulatory Visit: Payer: Self-pay

## 2021-07-08 ENCOUNTER — Ambulatory Visit (INDEPENDENT_AMBULATORY_CARE_PROVIDER_SITE_OTHER): Payer: Medicare HMO | Admitting: Family Medicine

## 2021-07-08 ENCOUNTER — Telehealth: Payer: Self-pay | Admitting: Family Medicine

## 2021-07-08 ENCOUNTER — Encounter: Payer: Self-pay | Admitting: Family Medicine

## 2021-07-08 VITALS — BP 152/92 | HR 114 | Temp 97.9°F | Ht 65.0 in | Wt 125.0 lb

## 2021-07-08 DIAGNOSIS — G8929 Other chronic pain: Secondary | ICD-10-CM | POA: Diagnosis not present

## 2021-07-08 DIAGNOSIS — I10 Essential (primary) hypertension: Secondary | ICD-10-CM

## 2021-07-08 DIAGNOSIS — M546 Pain in thoracic spine: Secondary | ICD-10-CM

## 2021-07-08 DIAGNOSIS — J449 Chronic obstructive pulmonary disease, unspecified: Secondary | ICD-10-CM

## 2021-07-08 MED ORDER — BETAMETHASONE SOD PHOS & ACET 6 (3-3) MG/ML IJ SUSP
6.0000 mg | Freq: Once | INTRAMUSCULAR | Status: AC
  Administered 2021-07-08: 6 mg via INTRAMUSCULAR

## 2021-07-08 MED ORDER — KETOROLAC TROMETHAMINE 60 MG/2ML IM SOLN
60.0000 mg | Freq: Once | INTRAMUSCULAR | Status: AC
  Administered 2021-07-08: 60 mg via INTRAMUSCULAR

## 2021-07-08 NOTE — Progress Notes (Signed)
Subjective:  Patient ID: Catherine Harrell, female    DOB: Apr 09, 1946  Age: 75 y.o. MRN: [id number]  CC: Pain   HPI Catherine Harrell presents for pain across the upper abdomen and into the flanks. Fell over her couch 2.5 mos ago. Pain continues to increase without relief from prednisone, Wants to see a bone specialist and have an MRI..   Ms. Catherine Harrell also has deafness and is very difficult to obtain answers to questions.  She is in end-stage COPD patient and her daughter sent in a request to that refer her to a pulmonologist.  She is currently on fairly maximal treatment including home oxygen, 50 mg prednisone daily, Spiriva and Symbicort as well as DuoNeb's.  He remains short of breath most of the time in spite of these treatments.  Depression screen [facility] 2/9 07/08/2021 03/14/2021 11/28/2020  Decreased Interest 0 0 0  Down, Depressed, Hopeless 0 0 0  PHQ - 2 Score 0 0 0    History Catherine Harrell has a past medical history of COPD (chronic obstructive pulmonary disease) (HCC), Hypertension, Osteoporosis, and TMJ arthralgia.   She has no past surgical history on file.   Her family history includes COPD in her father and sister; Cancer in her daughter and son; Diabetes in her mother and sister; Healthy in her son and son; Heart attack in her sister; Heart disease in her brother, father, and sister; Hypertension in her mother; Kidney disease in her daughter.She reports that she quit smoking about 16 years ago. Her smoking use included cigarettes. She has a 30.00 pack-year smoking history. She has never used smokeless tobacco. She reports that she does not drink alcohol and does not use drugs.    ROS Review of Systems  Unable to perform ROS: Other (deafness)   Objective:  BP (!) 152/92   Pulse (!) 114   Temp 97.9 F (36.6 C)   Ht 5\' 5"  (1.651 m)   Wt 125 lb (56.7 kg)   SpO2 96%   BMI 20.80 kg/m   BP Readings from Last 3 Encounters:  07/08/21 (!) 152/92  03/14/21 136/76  02/20/21 (!) 169/81     Wt Readings from Last 3 Encounters:  07/08/21 125 lb (56.7 kg)  03/14/21 125 lb (56.7 kg)  02/20/21 125 lb (56.7 kg)     Physical Exam Constitutional:      General: She is not in acute distress.    Appearance: She is well-developed.  HENT:     Head: Normocephalic and atraumatic.     Ears:     Comments: Minimal residual hearing Eyes:     Conjunctiva/sclera: Conjunctivae normal.     Pupils: Pupils are equal, round, and reactive to light.  Neck:     Thyroid: No thyromegaly.  Cardiovascular:     Rate and Rhythm: Normal rate and regular rhythm.     Heart sounds: Normal heart sounds. No murmur heard. Pulmonary:     Effort: Pulmonary effort is normal. No respiratory distress.     Breath sounds: Normal breath sounds. No wheezing or rales.  Abdominal:     General: Bowel sounds are normal. There is no distension.     Palpations: Abdomen is soft.     Tenderness: There is no abdominal tenderness.  Musculoskeletal:        General: Normal range of motion.     Cervical back: Normal range of motion and neck supple.  Lymphadenopathy:     Cervical: No cervical adenopathy.  Skin:    General:  Skin is warm and dry.  Neurological:     Mental Status: She is alert and oriented to person, place, and time.  Psychiatric:        Behavior: Behavior normal.        Thought Content: Thought content normal.        Judgment: Judgment normal.      Assessment & Plan:   Siobhan was seen today for pain.  Diagnoses and all orders for this visit:  End stage COPD (HCC) -     Ambulatory referral to Pulmonology -     betamethasone acetate-betamethasone sodium phosphate (CELESTONE) injection 6 mg  Essential hypertension  Chronic bilateral thoracic back pain -     Ambulatory referral to Orthopedics -     betamethasone acetate-betamethasone sodium phosphate (CELESTONE) injection 6 mg -     ketorolac (TORADOL) injection 60 mg      I am having Brexley Bara maintain her cholecalciferol,  Calcium 1200, acetaminophen, Spiriva Respimat, predniSONE, losartan, ipratropium-albuterol, budesonide-formoterol, and gabapentin. We administered betamethasone acetate-betamethasone sodium phosphate and ketorolac.  Allergies as of 07/08/2021       Reactions   Levofloxacin         Medication List        Accurate as of July 08, 2021  6:14 PM. If you have any questions, ask your nurse or doctor.          acetaminophen 325 MG tablet Commonly known as: TYLENOL Take 650 mg by mouth every 6 (six) hours as needed.   budesonide-formoterol 160-4.5 MCG/ACT inhaler Commonly known as: SYMBICORT Inhale 2 puffs into the lungs 2 (two) times daily.   Calcium 1200 1200-1000 MG-UNIT Chew Chew 1 tablet by mouth daily.   cholecalciferol 25 MCG (1000 UNIT) tablet Commonly known as: VITAMIN D3 Take 1,000 Units by mouth daily.   gabapentin 300 MG capsule Commonly known as: NEURONTIN Take 3 capsules (900 mg total) by mouth at bedtime.   ipratropium-albuterol 0.5-2.5 (3) MG/3ML Soln Commonly known as: DUONEB Take 3 mLs by nebulization every 4 (four) hours as needed. INHALE CONTENTS OF 1 VIAL VIA NEBULIZER EVERY 4 HOURS AS NEEDED   losartan 25 MG tablet Commonly known as: COZAAR Take 1 tablet (25 mg total) by mouth daily.   predniSONE 5 MG tablet Commonly known as: DELTASONE TAKE 3 TABLETS (15 MG TOTAL) BY MOUTH DAILY WITH BREAKFAST.   Spiriva Respimat 2.5 MCG/ACT Aers Generic drug: Tiotropium Bromide Monohydrate Inhale 2 puffs into the lungs daily.         Follow-up: Return in about 3 months (around 10/08/2021).  [name], M.D.

## 2021-07-10 DIAGNOSIS — J449 Chronic obstructive pulmonary disease, unspecified: Secondary | ICD-10-CM | POA: Diagnosis not present

## 2021-07-19 ENCOUNTER — Ambulatory Visit (INDEPENDENT_AMBULATORY_CARE_PROVIDER_SITE_OTHER): Payer: Medicare HMO | Admitting: Family Medicine

## 2021-07-19 ENCOUNTER — Other Ambulatory Visit: Payer: Self-pay

## 2021-07-19 ENCOUNTER — Ambulatory Visit (INDEPENDENT_AMBULATORY_CARE_PROVIDER_SITE_OTHER): Payer: Medicare HMO

## 2021-07-19 ENCOUNTER — Encounter: Payer: Self-pay | Admitting: Family Medicine

## 2021-07-19 DIAGNOSIS — J449 Chronic obstructive pulmonary disease, unspecified: Secondary | ICD-10-CM

## 2021-07-19 DIAGNOSIS — M546 Pain in thoracic spine: Secondary | ICD-10-CM

## 2021-07-19 NOTE — Progress Notes (Signed)
Emailed order, demographics, insurance information and ov note to [employer] with [facility]. He responded that all was received.

## 2021-07-19 NOTE — Progress Notes (Signed)
Office Visit Note   Patient: Catherine Harrell           Date of Birth: Apr 23, 1946           MRN: [id number] Visit Date: 07/19/2021 Requested by: [name], MD [address] [place],  [place] [address] PCP: [name], MD  Subjective: Chief Complaint  Patient presents with   Middle Back - Pain    Has had pain across her shoulder blades and around her torso for about a year, but it worsened after a fall in May -- tripped on a rug while entering through a doorway, and fell forward. She landed on her chest.     HPI: She is here with thoracic pain.  Symptoms for about a year, no injury at first but in May she tripped and fell landing on her chest and her pain got worse.  She has had right greater than left pain with radiation around the ribs.  She also has chronic COPD.  She has not had any treatment or evaluation yet.  She does have a history of lumbar compression fractures.               ROS:   All other systems were reviewed and are negative.  Objective: Vital Signs: There were no vitals taken for this visit.  Physical Exam:  General:  Alert and oriented, in no acute distress. Pulm:  Breathing unlabored. Psy:  Normal mood, congruent affect.  Back: She is tender to the right of midline at around the T8-10 level.  There is diffuse mild tenderness of the thoracic spinous processes.    Imaging: XR Thoracic Spine 2 View  Result Date: 07/19/2021 X-rays of the thoracic spine reveal at least 6 new compression fractures compared to CT images from 2021.   Assessment & Plan: Chronic thoracic pain, suspect due to multiple compression fractures -We will order a Spinamed back brace to decrease pain and improve posture. -MRI thoracic spine.  If symptoms do not improve, consider vertebroplasty consult. -Pulmonary consult for her COPD.     Procedures: No procedures performed        PMFS History: Patient Active Problem List   Diagnosis Date Noted   Presbycusis of both ears  12/06/2019   Osteoporosis 06/09/2017   Essential hypertension 08/07/2014   End stage COPD (HCC) 01/16/2014   Past Medical History:  Diagnosis Date   COPD (chronic obstructive pulmonary disease) (HCC)    Hypertension    Osteoporosis    TMJ arthralgia    left    Family History  Problem Relation Age of Onset   Heart disease Father    COPD Father    Heart disease Sister    Heart attack Sister    Diabetes Sister    COPD Sister    Heart disease Brother    Cancer Daughter        breast   Hypertension Mother    Diabetes Mother    Healthy Son    Cancer Son        Prostate    Kidney disease Daughter    Healthy Son     History reviewed. No pertinent surgical history. Social History   Occupational History   Occupation: Retired  Tobacco Use   Smoking status: Former    Packs/day: 1.00    Years: 30.00    Pack years: 30.00    Types: Cigarettes    Quit date: 01/16/2005    Years since quitting: 16.5   Smokeless tobacco:  Never  Vaping Use   Vaping Use: Never used  Substance and Sexual Activity   Alcohol use: No   Drug use: No   Sexual activity: Not Currently

## 2021-07-24 ENCOUNTER — Telehealth: Payer: Self-pay | Admitting: Family Medicine

## 2021-07-24 ENCOUNTER — Other Ambulatory Visit: Payer: Self-pay | Admitting: Orthopedic Surgery

## 2021-07-24 DIAGNOSIS — M546 Pain in thoracic spine: Secondary | ICD-10-CM

## 2021-07-24 NOTE — Telephone Encounter (Signed)
I called and spoke with Tammy. The spinamed brace order was emailed to [place] with [facility] on 07/19/21, with confirmation from Gerre Pebbles that it was received. He will be contacting Tammy. I sent another email to [facility] today, asking him to call her, as he can better answer her questions.

## 2021-07-24 NOTE — Telephone Encounter (Signed)
Needs to come in on my acute day schedule tomorrow.

## 2021-07-24 NOTE — Telephone Encounter (Signed)
Pt's daughter Babette Relic called requesting a call back from [place]. She stated Dr. Prince Rome was to send pt out to be measured for back brace and never received a call. Please call Tammy at [phone].

## 2021-07-24 NOTE — Telephone Encounter (Signed)
Left message to call back  

## 2021-07-24 NOTE — Telephone Encounter (Signed)
Pt had appt with Dr Darlyn Read on 8/15 for pain and was given 2 shots. Pt says she is still in pain and the shots did not help. Wants to know if there is any medicine Dr Darlyn Read can prescribe to her to help with her pain.  [facility] [place]

## 2021-07-25 ENCOUNTER — Other Ambulatory Visit: Payer: Self-pay

## 2021-07-25 ENCOUNTER — Ambulatory Visit (INDEPENDENT_AMBULATORY_CARE_PROVIDER_SITE_OTHER): Payer: Medicare HMO | Admitting: Family Medicine

## 2021-07-25 VITALS — BP 144/89 | HR 115 | Temp 98.0°F | Resp 20 | Ht 65.0 in | Wt 121.0 lb

## 2021-07-25 DIAGNOSIS — M546 Pain in thoracic spine: Secondary | ICD-10-CM | POA: Diagnosis not present

## 2021-07-25 DIAGNOSIS — J449 Chronic obstructive pulmonary disease, unspecified: Secondary | ICD-10-CM | POA: Diagnosis not present

## 2021-07-25 MED ORDER — MORPHINE SULFATE 15 MG PO TABS: 15.0000 mg | ORAL_TABLET | ORAL | 0 refills | Status: DC | PRN

## 2021-07-25 NOTE — Progress Notes (Signed)
Subjective:  Patient ID: Catherine Harrell, female    DOB: 1946-07-20  Age: 75 y.o. MRN: [id number]  CC: Back Pain (Scheduled for an MRI on 9/10)   HPI Catherine Harrell presents for continued back pain.  It is severe.  The area of pain is at the mid back radiating to the lower anterior chest.  Patient reports that she does have some shortness of breath more than usual.  We decreased her oxygen to 3 L and that took away the dyspnea.  She wears oxygen chronically usually at 2 L.  Her pain is 8-08/2009 through the affected area.  She has a CT scan of the back next week.  Depression screen [facility] 2/9 07/25/2021 07/08/2021 03/14/2021  Decreased Interest 0 0 0  Down, Depressed, Hopeless 0 0 0  PHQ - 2 Score 0 0 0  Altered sleeping 0 - -  Tired, decreased energy 0 - -  Change in appetite 0 - -  Feeling bad or failure about yourself  0 - -  Trouble concentrating 0 - -  Moving slowly or fidgety/restless 0 - -  Suicidal thoughts 0 - -  PHQ-9 Score 0 - -  Difficult doing work/chores Not difficult at all - -    History Catherine Harrell has a past medical history of COPD (chronic obstructive pulmonary disease) (HCC), Hypertension, Osteoporosis, and TMJ arthralgia.   She has no past surgical history on file.   Her family history includes COPD in her father and sister; Cancer in her daughter and son; Diabetes in her mother and sister; Healthy in her son and son; Heart attack in her sister; Heart disease in her brother, father, and sister; Hypertension in her mother; Kidney disease in her daughter.She reports that she quit smoking about 16 years ago. Her smoking use included cigarettes. She has a 30.00 pack-year smoking history. She has never used smokeless tobacco. She reports that she does not drink alcohol and does not use drugs.    ROS Review of Systems  Constitutional: Negative.   HENT: Negative.    Eyes:  Negative for visual disturbance.  Respiratory:  Negative for shortness of breath.   Cardiovascular:   Negative for chest pain.  Gastrointestinal:  Negative for abdominal pain.  Musculoskeletal:  Negative for arthralgias.   Objective:  BP (!) 144/89   Pulse (!) 115   Temp 98 F (36.7 C) (Temporal)   Resp 20   Ht 5\' 5"  (1.651 m)   Wt 121 lb (54.9 kg)   SpO2 94% Comment: 2 liters  BMI 20.14 kg/m   BP Readings from Last 3 Encounters:  07/29/21 (!) 160/96  07/25/21 (!) 144/89  07/08/21 (!) 152/92    Wt Readings from Last 3 Encounters:  07/29/21 125 lb (56.7 kg)  07/25/21 121 lb (54.9 kg)  07/08/21 125 lb (56.7 kg)     Physical Exam Constitutional:      General: She is not in acute distress.    Appearance: She is well-developed.  Cardiovascular:     Rate and Rhythm: Normal rate and regular rhythm.  Pulmonary:     Breath sounds: Normal breath sounds.  Musculoskeletal:        General: Tenderness (T11-L2, for percussion) present.  Skin:    General: Skin is warm and dry.  Neurological:     Mental Status: She is alert and oriented to person, place, and time.      Assessment & Plan:   Catherine Harrell was seen today for back pain.  Diagnoses and  all orders for this visit:  End stage COPD (HCC)  Acute right-sided thoracic back pain  Other orders -     morphine (MSIR) 15 MG tablet; Take 1 tablet (15 mg total) by mouth every 4 (four) hours as needed for severe pain.      I am having [employer] start on morphine. I am also having her maintain her cholecalciferol, Calcium 1200, acetaminophen, Spiriva Respimat, predniSONE, losartan, ipratropium-albuterol, and gabapentin.  Allergies as of 07/25/2021       Reactions   Levofloxacin         Medication List        Accurate as of July 25, 2021 11:59 PM. If you have any questions, ask your nurse or doctor.          acetaminophen 325 MG tablet Commonly known as: TYLENOL Take 650 mg by mouth every 6 (six) hours as needed.   Calcium 1200 1200-1000 MG-UNIT Chew Chew 1 tablet by mouth daily.   cholecalciferol  25 MCG (1000 UNIT) tablet Commonly known as: VITAMIN D3 Take 1,000 Units by mouth daily.   gabapentin 300 MG capsule Commonly known as: NEURONTIN Take 3 capsules (900 mg total) by mouth at bedtime.   ipratropium-albuterol 0.5-2.5 (3) MG/3ML Soln Commonly known as: DUONEB Take 3 mLs by nebulization every 4 (four) hours as needed. INHALE CONTENTS OF 1 VIAL VIA NEBULIZER EVERY 4 HOURS AS NEEDED   losartan 25 MG tablet Commonly known as: COZAAR Take 1 tablet (25 mg total) by mouth daily.   morphine 15 MG tablet Commonly known as: MSIR Take 1 tablet (15 mg total) by mouth every 4 (four) hours as needed for severe pain. Started by: [name], MD   predniSONE 5 MG tablet Commonly known as: DELTASONE TAKE 3 TABLETS (15 MG TOTAL) BY MOUTH DAILY WITH BREAKFAST.   Spiriva Respimat 2.5 MCG/ACT Aers Generic drug: Tiotropium Bromide Monohydrate Inhale 2 puffs into the lungs daily.         Follow-up: Return in about 2 weeks (around 08/08/2021).  [name], M.D.

## 2021-07-25 NOTE — Telephone Encounter (Signed)
Patient seen in office today. 

## 2021-07-29 ENCOUNTER — Encounter (HOSPITAL_COMMUNITY): Payer: Self-pay

## 2021-07-29 ENCOUNTER — Emergency Department (HOSPITAL_COMMUNITY): Payer: Medicare HMO

## 2021-07-29 ENCOUNTER — Encounter: Payer: Self-pay | Admitting: Family Medicine

## 2021-07-29 ENCOUNTER — Other Ambulatory Visit: Payer: Self-pay

## 2021-07-29 ENCOUNTER — Inpatient Hospital Stay (HOSPITAL_COMMUNITY)
Admission: EM | Admit: 2021-07-29 | Discharge: 2021-08-01 | DRG: 191 | Disposition: A | Discharge status: Home Health | Payer: Medicare HMO | Attending: Internal Medicine | Admitting: Internal Medicine

## 2021-07-29 DIAGNOSIS — J9611 Chronic respiratory failure with hypoxia: Secondary | ICD-10-CM | POA: Diagnosis present

## 2021-07-29 DIAGNOSIS — M26622 Arthralgia of left temporomandibular joint: Secondary | ICD-10-CM | POA: Diagnosis present

## 2021-07-29 DIAGNOSIS — J449 Chronic obstructive pulmonary disease, unspecified: Secondary | ICD-10-CM | POA: Diagnosis present

## 2021-07-29 DIAGNOSIS — R197 Diarrhea, unspecified: Secondary | ICD-10-CM | POA: Diagnosis not present

## 2021-07-29 DIAGNOSIS — Z8249 Family history of ischemic heart disease and other diseases of the circulatory system: Secondary | ICD-10-CM

## 2021-07-29 DIAGNOSIS — Z7952 Long term (current) use of systemic steroids: Secondary | ICD-10-CM

## 2021-07-29 DIAGNOSIS — G8929 Other chronic pain: Secondary | ICD-10-CM | POA: Diagnosis present

## 2021-07-29 DIAGNOSIS — R531 Weakness: Secondary | ICD-10-CM | POA: Diagnosis not present

## 2021-07-29 DIAGNOSIS — Z833 Family history of diabetes mellitus: Secondary | ICD-10-CM | POA: Diagnosis not present

## 2021-07-29 DIAGNOSIS — E860 Dehydration: Secondary | ICD-10-CM | POA: Diagnosis present

## 2021-07-29 DIAGNOSIS — I10 Essential (primary) hypertension: Secondary | ICD-10-CM | POA: Diagnosis present

## 2021-07-29 DIAGNOSIS — K449 Diaphragmatic hernia without obstruction or gangrene: Secondary | ICD-10-CM | POA: Diagnosis not present

## 2021-07-29 DIAGNOSIS — M810 Age-related osteoporosis without current pathological fracture: Secondary | ICD-10-CM | POA: Diagnosis not present

## 2021-07-29 DIAGNOSIS — Z79899 Other long term (current) drug therapy: Secondary | ICD-10-CM

## 2021-07-29 DIAGNOSIS — H9193 Unspecified hearing loss, bilateral: Secondary | ICD-10-CM | POA: Diagnosis present

## 2021-07-29 DIAGNOSIS — K5900 Constipation, unspecified: Secondary | ICD-10-CM

## 2021-07-29 DIAGNOSIS — Z20822 Contact with and (suspected) exposure to covid-19: Secondary | ICD-10-CM | POA: Diagnosis not present

## 2021-07-29 DIAGNOSIS — Z9181 History of falling: Secondary | ICD-10-CM | POA: Diagnosis not present

## 2021-07-29 DIAGNOSIS — J9621 Acute and chronic respiratory failure with hypoxia: Secondary | ICD-10-CM | POA: Diagnosis not present

## 2021-07-29 DIAGNOSIS — Z825 Family history of asthma and other chronic lower respiratory diseases: Secondary | ICD-10-CM

## 2021-07-29 DIAGNOSIS — R000 Tachycardia, unspecified: Secondary | ICD-10-CM | POA: Diagnosis not present

## 2021-07-29 DIAGNOSIS — T380X5A Adverse effect of glucocorticoids and synthetic analogues, initial encounter: Secondary | ICD-10-CM | POA: Diagnosis present

## 2021-07-29 DIAGNOSIS — Z87891 Personal history of nicotine dependence: Secondary | ICD-10-CM | POA: Diagnosis not present

## 2021-07-29 DIAGNOSIS — D7389 Other diseases of spleen: Secondary | ICD-10-CM | POA: Diagnosis not present

## 2021-07-29 DIAGNOSIS — Z66 Do not resuscitate: Secondary | ICD-10-CM | POA: Diagnosis not present

## 2021-07-29 DIAGNOSIS — Z9981 Dependence on supplemental oxygen: Secondary | ICD-10-CM | POA: Diagnosis not present

## 2021-07-29 DIAGNOSIS — S22080A Wedge compression fracture of T11-T12 vertebra, initial encounter for closed fracture: Secondary | ICD-10-CM | POA: Diagnosis not present

## 2021-07-29 DIAGNOSIS — I700 Atherosclerosis of aorta: Secondary | ICD-10-CM | POA: Diagnosis not present

## 2021-07-29 DIAGNOSIS — R0602 Shortness of breath: Secondary | ICD-10-CM | POA: Diagnosis not present

## 2021-07-29 DIAGNOSIS — J441 Chronic obstructive pulmonary disease with (acute) exacerbation: Principal | ICD-10-CM | POA: Diagnosis present

## 2021-07-29 LAB — CBC
HCT: 45 % (ref 36.0–46.0)
Hemoglobin: 13.9 g/dL (ref 12.0–15.0)
MCH: 29.8 pg (ref 26.0–34.0)
MCHC: 30.9 g/dL (ref 30.0–36.0)
MCV: 96.4 fL (ref 80.0–100.0)
Platelets: 425 10*3/uL — ABNORMAL HIGH (ref 150–400)
RBC: 4.67 MIL/uL (ref 3.87–5.11)
RDW: 12.7 % (ref 11.5–15.5)
WBC: 14.9 10*3/uL — ABNORMAL HIGH (ref 4.0–10.5)
nRBC: 0 % (ref 0.0–0.2)

## 2021-07-29 LAB — URINALYSIS, ROUTINE W REFLEX MICROSCOPIC
Bilirubin Urine: NEGATIVE
Glucose, UA: NEGATIVE mg/dL
Ketones, ur: NEGATIVE mg/dL
Nitrite: NEGATIVE
Protein, ur: NEGATIVE mg/dL
Specific Gravity, Urine: 1.015 (ref 1.005–1.030)
pH: 6 (ref 5.0–8.0)

## 2021-07-29 LAB — URINALYSIS, MICROSCOPIC (REFLEX)

## 2021-07-29 LAB — COMPREHENSIVE METABOLIC PANEL
ALT: 14 U/L (ref 0–44)
AST: 15 U/L (ref 15–41)
Albumin: 3.7 g/dL (ref 3.5–5.0)
Alkaline Phosphatase: 86 U/L (ref 38–126)
Anion gap: 7 (ref 5–15)
BUN: 10 mg/dL (ref 8–23)
CO2: 34 mmol/L — ABNORMAL HIGH (ref 22–32)
Calcium: 9.3 mg/dL (ref 8.9–10.3)
Chloride: 92 mmol/L — ABNORMAL LOW (ref 98–111)
Creatinine, Ser: 0.74 mg/dL (ref 0.44–1.00)
GFR, Estimated: >60 mL/min (ref >60)
Glucose, Bld: 105 mg/dL — ABNORMAL HIGH (ref 70–99)
Potassium: 4.4 mmol/L (ref 3.5–5.1)
Sodium: 133 mmol/L — ABNORMAL LOW (ref 135–145)
Total Bilirubin: 0.4 mg/dL (ref 0.3–1.2)
Total Protein: 7.6 g/dL (ref 6.5–8.1)

## 2021-07-29 LAB — LIPASE, BLOOD: Lipase: 44 U/L (ref 11–51)

## 2021-07-29 MED ORDER — POLYETHYLENE GLYCOL 3350 17 G PO PACK: 17.0000 g | PACK | Freq: Every day | ORAL | Status: DC | PRN

## 2021-07-29 MED ORDER — PREDNISONE 10 MG PO TABS
15.0000 mg | ORAL_TABLET | Freq: Every day | ORAL | Status: DC
  Administered 2021-07-30: 15 mg via ORAL
  Filled 2021-07-29: qty 2

## 2021-07-29 MED ORDER — ENOXAPARIN SODIUM 40 MG/0.4ML IJ SOSY
40.0000 mg | PREFILLED_SYRINGE | INTRAMUSCULAR | Status: DC
  Administered 2021-07-29 – 2021-07-31 (×3): 40 mg via SUBCUTANEOUS
  Filled 2021-07-29 (×3): qty 0.4

## 2021-07-29 MED ORDER — IPRATROPIUM-ALBUTEROL 0.5-2.5 (3) MG/3ML IN SOLN: 3.0000 mL | RESPIRATORY_TRACT | Status: DC | PRN

## 2021-07-29 MED ORDER — IPRATROPIUM-ALBUTEROL 0.5-2.5 (3) MG/3ML IN SOLN
3.0000 mL | Freq: Four times a day (QID) | RESPIRATORY_TRACT | Status: AC
  Administered 2021-07-29: 3 mL via RESPIRATORY_TRACT
  Filled 2021-07-29 (×2): qty 3

## 2021-07-29 MED ORDER — TIOTROPIUM BROMIDE MONOHYDRATE 2.5 MCG/ACT IN AERS: 2.0000 | INHALATION_SPRAY | Freq: Every day | RESPIRATORY_TRACT | Status: DC

## 2021-07-29 MED ORDER — GABAPENTIN 300 MG PO CAPS
900.0000 mg | ORAL_CAPSULE | Freq: Every day | ORAL | Status: DC
  Administered 2021-07-29: 900 mg via ORAL
  Administered 2021-07-30: 600 mg via ORAL
  Administered 2021-07-31: 900 mg via ORAL
  Filled 2021-07-29 (×3): qty 3

## 2021-07-29 MED ORDER — LOSARTAN POTASSIUM 50 MG PO TABS
25.0000 mg | ORAL_TABLET | Freq: Every day | ORAL | Status: DC
  Administered 2021-07-30 – 2021-08-01 (×3): 25 mg via ORAL
  Filled 2021-07-29 (×3): qty 1

## 2021-07-29 MED ORDER — ACETAMINOPHEN 650 MG RE SUPP: 650.0000 mg | Freq: Four times a day (QID) | RECTAL | Status: DC | PRN

## 2021-07-29 MED ORDER — SODIUM CHLORIDE 0.9 % IV SOLN
500.0000 mg | INTRAVENOUS | Status: DC
  Administered 2021-07-29 – 2021-07-31 (×3): 500 mg via INTRAVENOUS
  Filled 2021-07-29 (×3): qty 500

## 2021-07-29 MED ORDER — SODIUM CHLORIDE 0.9 % IV SOLN
1.0000 g | Freq: Once | INTRAVENOUS | Status: AC
  Administered 2021-07-29: 1 g via INTRAVENOUS
  Filled 2021-07-29: qty 10

## 2021-07-29 MED ORDER — BOOST / RESOURCE BREEZE PO LIQD CUSTOM
1.0000 | Freq: Three times a day (TID) | ORAL | Status: DC
  Administered 2021-07-30 – 2021-08-01 (×6): 1 via ORAL

## 2021-07-29 MED ORDER — ACETAMINOPHEN 325 MG PO TABS: 650.0000 mg | ORAL_TABLET | Freq: Four times a day (QID) | ORAL | Status: DC | PRN

## 2021-07-29 MED ORDER — ONDANSETRON HCL 4 MG/2ML IJ SOLN: 4.0000 mg | Freq: Four times a day (QID) | INTRAMUSCULAR | Status: DC | PRN

## 2021-07-29 MED ORDER — ONDANSETRON HCL 4 MG PO TABS: 4.0000 mg | ORAL_TABLET | Freq: Four times a day (QID) | ORAL | Status: DC | PRN

## 2021-07-29 MED ORDER — UMECLIDINIUM BROMIDE 62.5 MCG/INH IN AEPB
1.0000 | INHALATION_SPRAY | Freq: Every day | RESPIRATORY_TRACT | Status: DC
  Administered 2021-07-30: 1 via RESPIRATORY_TRACT
  Filled 2021-07-29: qty 7

## 2021-07-29 MED ORDER — IOHEXOL 350 MG/ML SOLN
100.0000 mL | Freq: Once | INTRAVENOUS | Status: AC | PRN
  Administered 2021-07-29: 80 mL via INTRAVENOUS

## 2021-07-29 MED ORDER — SODIUM CHLORIDE 0.9 % IV SOLN: INTRAVENOUS | Status: AC

## 2021-07-29 MED ORDER — LACTATED RINGERS IV BOLUS
1000.0000 mL | Freq: Once | INTRAVENOUS | Status: AC
  Administered 2021-07-29: 1000 mL via INTRAVENOUS

## 2021-07-29 MED ORDER — GUAIFENESIN-DM 100-10 MG/5ML PO SYRP
10.0000 mL | ORAL_SOLUTION | Freq: Four times a day (QID) | ORAL | Status: AC
  Administered 2021-07-29 – 2021-07-30 (×4): 10 mL via ORAL
  Filled 2021-07-29 (×4): qty 10

## 2021-07-29 NOTE — ED Notes (Signed)
Checked on pt. Pt went to the restroom and gave urine sample. Got vitals and asked pt if she needed anything else.

## 2021-07-29 NOTE — Plan of Care (Signed)
  Problem: Education: Goal: Knowledge of General Education information will improve Description: Including pain rating scale, medication(s)/side effects and non-pharmacologic comfort measures Outcome: Progressing   Problem: Activity: Goal: Risk for activity intolerance will decrease Outcome: Progressing   Problem: Coping: Goal: Level of anxiety will decrease Outcome: Progressing   

## 2021-07-29 NOTE — Progress Notes (Signed)
Received report from ED, Catherine Muir, RN. Assumed pt care at 1800. Pt arrived to unit in stable condition accompanied by her daughter, Summit Borchardt. Pt is deaf and requires everything to be written down on paper to communicate. O2 @ 3L/[place] chronic at home. Lungs diminished, rhonchi, and wheezing present. Nebulizer treatments ordered. Pt tolerated diet well. No swallowing issues. HOB greater than 45 degrees. Pt/daughter reports pt is unable to provide a stool sample since coming to the hospital despite having multiple diarrhea episodes at home. BLE scabbed areas d/t recent fall last week per daughter, no other skin issues noted. Call bell within within reach. Will continue to monitor.

## 2021-07-29 NOTE — ED Notes (Signed)
Patient to bathroom for stool sample

## 2021-07-29 NOTE — ED Notes (Signed)
Patient ambulated approximately 20 feet to and from the bathroom with portable oxygen at 3L/[place].

## 2021-07-29 NOTE — ED Provider Notes (Signed)
[facility] EMERGENCY DEPARTMENT Provider Note   CSN: [id number] Arrival date & time: 07/29/21  1123     History Chief Complaint  Patient presents with   Diarrhea    Catherine Harrell is a 75 y.o. female with PMH COPD, HTN, osteoporosis who presents emergency department for evaluation of diarrhea.  Patient states that she has had approximately 1 week of diarrhea but has been getting progressively worse.  She states her diarrhea is watery, foul-smelling, nonbloody.  Denies chest pain, shortness of breath, headache, fever or other systemic symptoms.  Patient arrives tachycardic but otherwise hemodynamically stable.   Diarrhea Associated symptoms: no abdominal pain, no arthralgias, no chills, no fever and no vomiting       Past Medical History:  Diagnosis Date   COPD (chronic obstructive pulmonary disease) (HCC)    Hypertension    Osteoporosis    TMJ arthralgia    left    Patient Active Problem List   Diagnosis Date Noted   Presbycusis of both ears 12/06/2019   Osteoporosis 06/09/2017   Essential hypertension 08/07/2014   End stage COPD (HCC) 01/16/2014    History reviewed. No pertinent surgical history.   OB History   No obstetric history on file.     Family History  Problem Relation Age of Onset   Heart disease Father    COPD Father    Heart disease Sister    Heart attack Sister    Diabetes Sister    COPD Sister    Heart disease Brother    Cancer Daughter        breast   Hypertension Mother    Diabetes Mother    Healthy Son    Cancer Son        Prostate    Kidney disease Daughter    Healthy Son     Social History   Tobacco Use   Smoking status: Former    Packs/day: 1.00    Years: 30.00    Pack years: 30.00    Types: Cigarettes    Quit date: 01/16/2005    Years since quitting: 16.5   Smokeless tobacco: Never  Vaping Use   Vaping Use: Never used  Substance Use Topics   Alcohol use: No   Drug use: No    Home Medications Prior to Admission  medications   Medication Sig Start Date End Date Taking? Authorizing Provider  acetaminophen (TYLENOL) 325 MG tablet Take 650 mg by mouth every 6 (six) hours as needed.    [provider]  Calcium Carbonate-Vit D-Min (CALCIUM 1200) 1200-1000 MG-UNIT CHEW Chew 1 tablet by mouth daily. 10/09/20   Daryll Drown, NP  cholecalciferol (VITAMIN D3) 25 MCG (1000 UNIT) tablet Take 1,000 Units by mouth daily.    [provider]  gabapentin (NEURONTIN) 300 MG capsule Take 3 capsules (900 mg total) by mouth at bedtime. 06/14/21   Daphine Deutscher, Mary-Margaret, FNP  ipratropium-albuterol (DUONEB) 0.5-2.5 (3) MG/3ML SOLN Take 3 mLs by nebulization every 4 (four) hours as needed. INHALE CONTENTS OF 1 VIAL VIA NEBULIZER EVERY 4 HOURS AS NEEDED 03/14/21   [name], MD  losartan (COZAAR) 25 MG tablet Take 1 tablet (25 mg total) by mouth daily. 03/14/21   [name], MD  morphine (MSIR) 15 MG tablet Take 1 tablet (15 mg total) by mouth every 4 (four) hours as needed for severe pain. 07/25/21   [name], MD  predniSONE (DELTASONE) 5 MG tablet TAKE 3 TABLETS (15 MG TOTAL) BY MOUTH DAILY WITH  BREAKFAST. 01/09/21   [name], MD  Tiotropium Bromide Monohydrate (SPIRIVA RESPIMAT) 2.5 MCG/ACT AERS Inhale 2 puffs into the lungs daily. 11/14/20   Gabriel Earing, FNP    Allergies    Levofloxacin  Review of Systems   Review of Systems  Constitutional:  Negative for chills and fever.  HENT:  Negative for ear pain and sore throat.   Eyes:  Negative for pain and visual disturbance.  Respiratory:  Negative for cough and shortness of breath.   Cardiovascular:  Negative for chest pain and palpitations.  Gastrointestinal:  Positive for diarrhea. Negative for abdominal pain and vomiting.  Genitourinary:  Negative for dysuria and hematuria.  Musculoskeletal:  Negative for arthralgias and back pain.  Skin:  Negative for color change and rash.  Neurological:  Negative for seizures and syncope.   All other systems reviewed and are negative.  Physical Exam Updated Vital Signs BP (!) 160/96 (BP Location: Right Arm)   Pulse (!) 112   Temp 98.2 F (36.8 C) (Oral)   Resp (!) 22   Ht 5\' 5"  (1.651 m)   Wt 56.7 kg   SpO2 100%   BMI 20.80 kg/m   Physical Exam Vitals and nursing note reviewed.  Constitutional:      General: She is not in acute distress.    Appearance: She is well-developed.  HENT:     Head: Normocephalic and atraumatic.  Eyes:     Conjunctiva/sclera: Conjunctivae normal.  Cardiovascular:     Rate and Rhythm: Regular rhythm. Tachycardia present.     Heart sounds: No murmur heard. Pulmonary:     Effort: Pulmonary effort is normal. No respiratory distress.     Breath sounds: Normal breath sounds.  Abdominal:     Palpations: Abdomen is soft.     Tenderness: There is no abdominal tenderness.  Musculoskeletal:     Cervical back: Neck supple.  Skin:    General: Skin is warm and dry.  Neurological:     Mental Status: She is alert.    ED Results / Procedures / Treatments   Labs (all labs ordered are listed, but only abnormal results are displayed) Labs Reviewed  COMPREHENSIVE METABOLIC PANEL - Abnormal; Notable for the following components:      Result Value   Sodium 133 (*)    Chloride 92 (*)    CO2 34 (*)    Glucose, Bld 105 (*)    All other components within normal limits  CBC - Abnormal; Notable for the following components:   WBC 14.9 (*)    Platelets 425 (*)    All other components within normal limits  URINALYSIS, ROUTINE W REFLEX MICROSCOPIC - Abnormal; Notable for the following components:   Hgb urine dipstick SMALL (*)    Leukocytes,Ua SMALL (*)    All other components within normal limits  URINALYSIS, MICROSCOPIC (REFLEX) - Abnormal; Notable for the following components:   Bacteria, UA RARE (*)    All other components within normal limits  LIPASE, BLOOD    EKG None  Radiology No results found.  Procedures Procedures    Medications Ordered in ED Medications  lactated ringers bolus 1,000 mL (has no administration in time range)    ED Course  I have reviewed the triage vital signs and the nursing notes.  Pertinent labs & imaging results that were available during my care of the patient were reviewed by me and considered in my medical decision making (see chart for details).    MDM  Rules/Calculators/A&P                           Patient seen in the emergency department for evaluation of diarrhea.  Physical exam unremarkable.  Laboratory evaluation with leukocytosis to 14.9, hyponatremia 133, hypochloremia to 92, glucose elevated to 34.  X-ray with possible pneumonia developing in the left upper lobe, evidence of mild edema.  CT abdomen pelvis unremarkable.  IVF initiated in the setting of persistent diarrhea and ceftriaxone azithromycin initiated.  A Legionella antigen was also sent in the setting of diarrhea and possible pneumonia.  Patient then admitted. Final Clinical Impression(s) / ED Diagnoses Final diagnoses:  None    Rx / DC Orders ED Discharge Orders     None        Antha Niday, Wyn Forster, MD 07/29/21 [phone]

## 2021-07-29 NOTE — ED Triage Notes (Signed)
Pt. States they have been having diarrhea for about a week now.

## 2021-07-29 NOTE — H&P (Addendum)
History and Physical    Catherine Harrell NAT:[id number] DOB: Jun 04, 1946 DOA: 07/29/2021  PCP: [name], MD   Patient coming from: Home  I have personally briefly reviewed patient's old medical records in [facility]  Chief Complaint: Diarrhea  HPI: Catherine Harrell is a 75 y.o. female with medical history significant for COPD, with chronic respiratory failure on 3 L, hypertension.  Patient presented today with complaints of diarrhea of 1 week duration. She reports lose and watery stools after meals, about 3 times a day.  Patient is very hard of hearing, questions are written down on paper by daughter- Catherine Harrell for patient to read.  Lives with her husband.   No pain with urination.  No vomiting.  She reported right-sided abdominal pain yesterday.  No recent antibiotics. Daughter also reports multiple falls, last fall was about 2 weeks ago.  Patient at baseline uses a cane, she is supposed to use a walker, but because of her oxygen tank she is unable to use it. Patient has chronic productive cough that is unchanged.  She also has chronic difficulty breathing for which her home O2 sats had to be increased from 2 to 3 L.  ED Course: T-max 99.1.  Heart rate 113, respiratory 22-28.  Blood pressure 150s to 160s.  O2 sats 95 - 100% on 3 L.  Echo cytosis of 14.9.  UA small leukocytes.  Chest x-ray with increased interstitial coarsening suggesting mild edema, asymmetric increase in left upper lobe infection not excluded. CT abdomen and pelvis with contrast without acute abnormality.  Shows osteopenic compression fractures. 1 L Ringer's lactate bolus given hospitalist to admit for dehydration, diarrhea.  Review of Systems: As per HPI all other systems reviewed and negative.  Past Medical History:  Diagnosis Date   COPD (chronic obstructive pulmonary disease) (HCC)    Hypertension    Osteoporosis    TMJ arthralgia    left    History reviewed. No pertinent surgical history.   reports that she  quit smoking about 16 years ago. Her smoking use included cigarettes. She has a 30.00 pack-year smoking history. She has never used smokeless tobacco. She reports that she does not drink alcohol and does not use drugs.  Allergies  Allergen Reactions   Levofloxacin     Family History  Problem Relation Age of Onset   Heart disease Father    COPD Father    Heart disease Sister    Heart attack Sister    Diabetes Sister    COPD Sister    Heart disease Brother    Cancer Daughter        breast   Hypertension Mother    Diabetes Mother    Healthy Son    Cancer Son        Prostate    Kidney disease Daughter    Healthy Son     Prior to Admission medications   Medication Sig Start Date End Date Taking? Authorizing Provider  acetaminophen (TYLENOL) 325 MG tablet Take 650 mg by mouth every 6 (six) hours as needed.    [provider]  Calcium Carbonate-Vit D-Min (CALCIUM 1200) 1200-1000 MG-UNIT CHEW Chew 1 tablet by mouth daily. 10/09/20   Daryll Drown, NP  cholecalciferol (VITAMIN D3) 25 MCG (1000 UNIT) tablet Take 1,000 Units by mouth daily.    [provider]  gabapentin (NEURONTIN) 300 MG capsule Take 3 capsules (900 mg total) by mouth at bedtime. 06/14/21   Daphine Deutscher Mary-Margaret, FNP  ipratropium-albuterol (DUONEB) 0.5-2.5 (  3) MG/3ML SOLN Take 3 mLs by nebulization every 4 (four) hours as needed. INHALE CONTENTS OF 1 VIAL VIA NEBULIZER EVERY 4 HOURS AS NEEDED 03/14/21   [name], MD  losartan (COZAAR) 25 MG tablet Take 1 tablet (25 mg total) by mouth daily. 03/14/21   [name], MD  morphine (MSIR) 15 MG tablet Take 1 tablet (15 mg total) by mouth every 4 (four) hours as needed for severe pain. 07/25/21   [name], MD  predniSONE (DELTASONE) 5 MG tablet TAKE 3 TABLETS (15 MG TOTAL) BY MOUTH DAILY WITH BREAKFAST. 01/09/21   [name], MD  Tiotropium Bromide Monohydrate (SPIRIVA RESPIMAT) 2.5 MCG/ACT AERS Inhale 2 puffs into the lungs daily. 11/14/20    Gabriel Earing, FNP    Physical Exam: Vitals:   07/29/21 1143 07/29/21 1145 07/29/21 1252  BP: (!) 152/87  (!) 160/96  Pulse: (!) 113  (!) 112  Resp: (!) 22  (!) 22  Temp: 98.3 F (36.8 C)  98.2 F (36.8 C)  TempSrc: Oral  Oral  SpO2: 95%  100%  Weight:  56.7 kg   Height:  5\' 5"  (1.651 m)     Constitutional: Chronically ill-appearing due to advanced COPD, calm, comfortable Vitals:   07/29/21 1143 07/29/21 1145 07/29/21 1252  BP: (!) 152/87  (!) 160/96  Pulse: (!) 113  (!) 112  Resp: (!) 22  (!) 22  Temp: 98.3 F (36.8 C)  98.2 F (36.8 C)  TempSrc: Oral  Oral  SpO2: 95%  100%  Weight:  56.7 kg   Height:  5\' 5"  (1.651 m)    Eyes: PERRL, lids and conjunctivae normal ENMT: Mucous membranes are  Neck: normal, supple, no masses, no thyromegaly Respiratory: Chest sounds congested.  Very faint and occasional expiratory wheezing.  Increased work of breathing -patient's baseline. Cardiovascular: Tachycardic rate and rhythm, no murmurs / rubs / gallops. No extremity edema. 2+ pedal pulses.   Abdomen: no tenderness, no masses palpated. No hepatosplenomegaly. Bowel sounds positive.  Musculoskeletal: no clubbing / cyanosis. No joint deformity upper and lower extremities. Good ROM, no contractures. Normal muscle tone.  Skin: no rashes, lesions, ulcers. No induration Neurologic: No apparent cranial nerve abnormality, moving extremities spontaneously.09/28/21  Psychiatric: Normal judgment and insight. Alert and oriented x 3. Normal mood.   Labs on Admission: I have personally reviewed following labs and imaging studies  CBC: Recent Labs  Lab 07/29/21 1206  WBC 14.9*  HGB 13.9  HCT 45.0  MCV 96.4  PLT 425*   Basic Metabolic Panel: Recent Labs  Lab 07/29/21 1206  NA 133*  K 4.4  CL 92*  CO2 34*  GLUCOSE 105*  BUN 10  CREATININE 0.74  CALCIUM 9.3   Liver Function Tests: Recent Labs  Lab 07/29/21 1206  AST 15  ALT 14  ALKPHOS 86  BILITOT 0.4  PROT 7.6  ALBUMIN 3.7    Recent Labs  Lab 07/29/21 1206  LIPASE 44   Urine analysis:    Component Value Date/Time   COLORURINE YELLOW 07/29/2021 1302   APPEARANCEUR CLEAR 07/29/2021 1302   APPEARANCEUR Clear 02/20/2021 1532   LABSPEC 1.015 07/29/2021 1302   PHURINE 6.0 07/29/2021 1302   GLUCOSEU NEGATIVE 07/29/2021 1302   HGBUR SMALL (A) 07/29/2021 1302   BILIRUBINUR NEGATIVE 07/29/2021 1302   BILIRUBINUR Negative 02/20/2021 1532   KETONESUR NEGATIVE 07/29/2021 1302   PROTEINUR NEGATIVE 07/29/2021 1302   UROBILINOGEN negative 12/19/2015 1720   NITRITE NEGATIVE 07/29/2021 1302   LEUKOCYTESUR SMALL (  A) 07/29/2021 1302    Radiological Exams on Admission: CT ABDOMEN PELVIS W CONTRAST  Result Date: 07/29/2021 CLINICAL DATA:  Diarrhea for 3 weeks. EXAM: CT ABDOMEN AND PELVIS WITH CONTRAST TECHNIQUE: Multidetector CT imaging of the abdomen and pelvis was performed using the standard protocol following bolus administration of intravenous contrast. CONTRAST:  81mL OMNIPAQUE IOHEXOL 350 MG/ML SOLN COMPARISON:  Lumbar spine films on 09/24/2020 and thoracic spine films on 07/19/2021 FINDINGS: Lower chest: No acute abnormality.  Small hiatal hernia. Hepatobiliary: No focal liver abnormality is seen. No gallstones, gallbladder wall thickening, or biliary dilatation. Pancreas: Unremarkable. No pancreatic ductal dilatation or surrounding inflammatory changes. Spleen: Multiple calcified granulomata in a small spleen. Adrenals/Urinary Tract: Adrenal glands are unremarkable. Kidneys are normal, without renal calculi, focal lesion, or hydronephrosis. Bladder is unremarkable. Stomach/Bowel: Bowel shows no evidence of obstruction, ileus, inflammatory process or focal lesion. No evidence of free intraperitoneal air. Vascular/Lymphatic: Heavily calcified abdominal aorta without aneurysm. No enlarged lymph nodes identified. Reproductive: Uterus and bilateral adnexa are unremarkable. Other: No abdominal wall hernia or abnormality. No  abdominopelvic ascites. Musculoskeletal: Osteopenia with compression fractures of the T11, T12 and L1 vertebral bodies. Loss of height appears similar to the recent thoracic spine films and progressive at the T12 and L1 level since the prior lumbar spine films. Stable mild anterolisthesis of L4 on L5. IMPRESSION: 1. Small hiatal hernia 2. Calcified granulomata of the spleen 3. No acute process identified in the abdomen or pelvis. 4. Osteopenic compression fractures of the T11, T12 and L1 vertebral bodies. Electronically Signed   By: Irish Lack M.D.   On: 07/29/2021 15:00   DG Chest Portable 1 View  Result Date: 07/29/2021 CLINICAL DATA:  Generalized weakness and diarrhea. Shortness of breath. EXAM: PORTABLE CHEST 1 VIEW COMPARISON:  One-view chest x-ray 11/08/2020 FINDINGS: Heart size normal. Diffuse interstitial coarsening in noted. There is slight increased to the overall pattern, suggesting mild edema. Asymmetric increase noted in the left upper lobe. Infection not excluded. IMPRESSION: 1. Increasing interstitial coarsening, suggesting mild edema. 2. Asymmetric increase in the left upper lobe. Infection not excluded. Electronically Signed   By: Marin Roberts M.D.   On: 07/29/2021 14:17    EKG: Independently reviewed.  Sinus tachycardia rate 106, QTc 435.  No significant abnormalities compared to prior. Assessment/Plan Principal Problem:   Diarrhea Active Problems:   End stage COPD (HCC)   Essential hypertension  Diarrhea-initial tachycardia to 113, WBC- 14.9 likely 2/2 chronic steroids.  T-max 99.1.  Tachypnea likely secondary to advanced COPD.  CT abdomen pelvis negative for acute abnormality. -Follow-up stool C. Difficile -If negative, as needed Imodium -1 L bolus given continue N/s 75cc/hr x 15hrs  Advanced COPD with chronic respiratory failure on 3 L-chronic unchanged productive cough and increasing dyspnea over the past week which I think is due to her COPD.  Chest x-ray shows  increased interstitial coarsening suggesting edema, asymmetric increase in left upper lobe, infection not excluded. -DuoNebs as needed and scheduled -Mucolytic's -IV Azithromycin started in ED will continue - Resume chronic prednisone  HTN-systolic 150s to 627O. -Resume losartan  Chronic pain -Resume gabapentin  Hx of Multiple falls - PT eval.   DVT prophylaxis: Lovenox Code Status: DNR - confirmed with daughter at bedside. Family Communication: Daughter Catherine Harrell is HCPOA, and present at bedside.   Disposition Plan: ~ 1 - 2 days Consults called: None Admission status: obs, tele  Onnie Boer MD [facility]  07/29/2021, 6:02 PM

## 2021-07-30 DIAGNOSIS — I10 Essential (primary) hypertension: Secondary | ICD-10-CM | POA: Diagnosis present

## 2021-07-30 DIAGNOSIS — Z8249 Family history of ischemic heart disease and other diseases of the circulatory system: Secondary | ICD-10-CM | POA: Diagnosis not present

## 2021-07-30 DIAGNOSIS — J9611 Chronic respiratory failure with hypoxia: Secondary | ICD-10-CM | POA: Diagnosis present

## 2021-07-30 DIAGNOSIS — R197 Diarrhea, unspecified: Secondary | ICD-10-CM | POA: Diagnosis present

## 2021-07-30 DIAGNOSIS — J449 Chronic obstructive pulmonary disease, unspecified: Secondary | ICD-10-CM

## 2021-07-30 DIAGNOSIS — M810 Age-related osteoporosis without current pathological fracture: Secondary | ICD-10-CM | POA: Diagnosis present

## 2021-07-30 DIAGNOSIS — M26622 Arthralgia of left temporomandibular joint: Secondary | ICD-10-CM | POA: Diagnosis present

## 2021-07-30 DIAGNOSIS — T380X5A Adverse effect of glucocorticoids and synthetic analogues, initial encounter: Secondary | ICD-10-CM | POA: Diagnosis present

## 2021-07-30 DIAGNOSIS — J9621 Acute and chronic respiratory failure with hypoxia: Secondary | ICD-10-CM | POA: Diagnosis not present

## 2021-07-30 DIAGNOSIS — E860 Dehydration: Secondary | ICD-10-CM | POA: Diagnosis present

## 2021-07-30 DIAGNOSIS — J441 Chronic obstructive pulmonary disease with (acute) exacerbation: Secondary | ICD-10-CM | POA: Diagnosis present

## 2021-07-30 DIAGNOSIS — I700 Atherosclerosis of aorta: Secondary | ICD-10-CM | POA: Diagnosis not present

## 2021-07-30 DIAGNOSIS — Z9181 History of falling: Secondary | ICD-10-CM | POA: Diagnosis not present

## 2021-07-30 DIAGNOSIS — H9193 Unspecified hearing loss, bilateral: Secondary | ICD-10-CM | POA: Diagnosis present

## 2021-07-30 DIAGNOSIS — Z20822 Contact with and (suspected) exposure to covid-19: Secondary | ICD-10-CM | POA: Diagnosis present

## 2021-07-30 DIAGNOSIS — G8929 Other chronic pain: Secondary | ICD-10-CM | POA: Diagnosis present

## 2021-07-30 DIAGNOSIS — Z9981 Dependence on supplemental oxygen: Secondary | ICD-10-CM | POA: Diagnosis not present

## 2021-07-30 DIAGNOSIS — Z825 Family history of asthma and other chronic lower respiratory diseases: Secondary | ICD-10-CM | POA: Diagnosis not present

## 2021-07-30 DIAGNOSIS — Z87891 Personal history of nicotine dependence: Secondary | ICD-10-CM | POA: Diagnosis not present

## 2021-07-30 DIAGNOSIS — Z66 Do not resuscitate: Secondary | ICD-10-CM | POA: Diagnosis present

## 2021-07-30 DIAGNOSIS — Z79899 Other long term (current) drug therapy: Secondary | ICD-10-CM | POA: Diagnosis not present

## 2021-07-30 DIAGNOSIS — Z7952 Long term (current) use of systemic steroids: Secondary | ICD-10-CM | POA: Diagnosis not present

## 2021-07-30 DIAGNOSIS — Z833 Family history of diabetes mellitus: Secondary | ICD-10-CM | POA: Diagnosis not present

## 2021-07-30 LAB — CBC
HCT: 37.3 % (ref 36.0–46.0)
Hemoglobin: 11.2 g/dL — ABNORMAL LOW (ref 12.0–15.0)
MCH: 29.2 pg (ref 26.0–34.0)
MCHC: 30 g/dL (ref 30.0–36.0)
MCV: 97.4 fL (ref 80.0–100.0)
Platelets: 312 10*3/uL (ref 150–400)
RBC: 3.83 MIL/uL — ABNORMAL LOW (ref 3.87–5.11)
RDW: 12.6 % (ref 11.5–15.5)
WBC: 9 10*3/uL (ref 4.0–10.5)
nRBC: 0 % (ref 0.0–0.2)

## 2021-07-30 LAB — SARS CORONAVIRUS 2 (TAT 6-24 HRS): SARS Coronavirus 2: NEGATIVE

## 2021-07-30 MED ORDER — METHYLPREDNISOLONE SODIUM SUCC 125 MG IJ SOLR
60.0000 mg | Freq: Every day | INTRAMUSCULAR | Status: DC
  Administered 2021-07-30 – 2021-08-01 (×3): 60 mg via INTRAVENOUS
  Filled 2021-07-30 (×3): qty 2

## 2021-07-30 MED ORDER — IPRATROPIUM-ALBUTEROL 0.5-2.5 (3) MG/3ML IN SOLN
3.0000 mL | Freq: Four times a day (QID) | RESPIRATORY_TRACT | Status: DC
  Administered 2021-07-30 – 2021-08-01 (×9): 3 mL via RESPIRATORY_TRACT
  Filled 2021-07-30 (×8): qty 3

## 2021-07-30 NOTE — Progress Notes (Signed)
PROGRESS NOTE    Catherine Harrell  ZWC:[id number] DOB: 1945-12-14 DOA: 07/29/2021 PCP: [name], MD   Brief Narrative:  75 year old female PMHx COPD, chronic respiratory failure with hypoxia on 2 L O2 at home, HTN, LEFT TMJ arthralgia, osteoporosis  Patient presented today with complaints of diarrhea of 1 week duration. She reports lose and watery stools after meals, about 3 times a day.  Patient is very hard of hearing, questions are written down on paper by daughter- Tammy for patient to read.  Lives with her husband.   No pain with urination.  No vomiting.  She reported right-sided abdominal pain yesterday.  No recent antibiotics. Daughter also reports multiple falls, last fall was about 2 weeks ago.  Patient at baseline uses a cane, she is supposed to use a walker, but because of her oxygen tank she is unable to use it.  Subjective: Alert, deaf.  Diarrhea resolved overnight per her daughter, and patient now hungry.   Assessment & Plan:  Covid vaccination;  Principal Problem:   Diarrhea Active Problems:   End stage COPD (HCC)   Essential hypertension   Osteoporosis   Chronic respiratory failure with hypoxia (HCC)   Diarrhea -Diarrhea x1 week  - CT negative for -9/6 GI stool panel pending -9/6 C. Difficile -9/6 stool NA/K pending - 9/6 stool osmolality. - 9/6 lactoferrin pending -9/6 occult blood pending -CT abdomen nondiagnostic for cause of diarrhea see results below - 9/6 advance diet to bland - Normal saline88ml/hr  Chronic respiratory failure with hypoxia/Advanced COPD - Patient normally on 2 L O2 via [place] at home now increased O2 demand. - Complete 5 to 7-day course antibiotics - Solu-Medrol 60 mg daily - Flutter valve - Incentive spirometry -DuoNeb QID  Essential HTN -Losartan 25 mg daily   Chronic pain -Neurontin 900 mg qhs   Hx of Multiple falls -9/6 PT eval. recommends SNF.  Will need to discuss with daughter in the a.m.   DVT prophylaxis:  Lovenox Code Status: DNR Family Communication: 9/6 daughter at bedside for discussion of plan of care all questions answered Status is: Inpatient    Dispo: The patient is from: Home              Anticipated d/c is to: SNF              Anticipated d/c date is: 3 days              Patient currently is not medically stable to d/c.      Consultants:    Procedures/Significant Events:  Chest x-ray with increased interstitial coarsening suggesting mild edema, asymmetric increase in left upper lobe infection not excluded. CT abdomen and pelvis with contrast without acute abnormality.  Shows osteopenic compression fractures.   I have personally reviewed and interpreted all radiology studies and my findings are as above.  VENTILATOR SETTINGS:    Cultures   Antimicrobials: Anti-infectives (From admission, onward)    Start     Ordered Stop   07/29/21 1615  cefTRIAXone (ROCEPHIN) 1 g in sodium chloride 0.9 % 100 mL IVPB        07/29/21 1603 07/29/21 1710   07/29/21 1615  azithromycin (ZITHROMAX) 500 mg in sodium chloride 0.9 % 250 mL IVPB        07/29/21 1603           Devices    LINES / TUBES:      Continuous Infusions:  sodium chloride 75 mL/hr at 07/29/21 1846  azithromycin 500 mg (07/29/21 2025)     Objective: Vitals:   07/29/21 2214 07/30/21 0159 07/30/21 0609 07/30/21 0815  BP: (!) 147/75 (!) 142/85 (!) 142/73   Pulse: 87 92 86   Resp: 19 19 19    Temp: 98.2 F (36.8 C) 98 F (36.7 C) 98.2 F (36.8 C)   TempSrc: Oral Oral Oral   SpO2: 99% 100% 99% 98%  Weight:      Height:        Intake/Output Summary (Last 24 hours) at 07/30/2021 1246 Last data filed at 07/30/2021 0830 Gross per 24 hour  Intake 2632.68 ml  Output --  Net 2632.68 ml   Filed Weights   07/29/21 1145  Weight: 56.7 kg    Examination:  General: Alert, but deaf, positive acute on chronic respiratory distress Eyes: negative scleral hemorrhage, negative anisocoria, negative  icterus ENT: Negative Runny nose, negative gingival bleeding, Neck:  Negative scars, masses, torticollis, lymphadenopathy, JVD Lungs: decreased breath sounds,, positive expiratory wheeze, coarse breath sounds, negative crackles Cardiovascular: Regular rate and rhythm without murmur gallop or rub normal S1 and S2 Abdomen: negative abdominal pain, nondistended, positive soft, bowel sounds, no rebound, no ascites, no appreciable mass Extremities: No significant cyanosis, clubbing, or edema bilateral lower extremities Skin: Negative rashes, lesions, ulcers Psychiatric:  Negative depression, negative anxiety, negative fatigue, negative mania  Central nervous system:  Cranial nerves II through XII intact, tongue/uvula midline, all extremities muscle strength 5/5, sensation intact throughout, negative dysarthria, negative expressive aphasia, negative receptive aphasia.  .     Data Reviewed: Care during the described time interval was provided by me .  I have reviewed this patient's available data, including medical history, events of note, physical examination, and all test results as part of my evaluation.   CBC: Recent Labs  Lab 07/29/21 1206 07/30/21 0610  WBC 14.9* 9.0  HGB 13.9 11.2*  HCT 45.0 37.3  MCV 96.4 97.4  PLT 425* 312   Basic Metabolic Panel: Recent Labs  Lab 07/29/21 1206  NA 133*  K 4.4  CL 92*  CO2 34*  GLUCOSE 105*  BUN 10  CREATININE 0.74  CALCIUM 9.3   GFR: Estimated Creatinine Clearance: 53.6 mL/min (by C-G formula based on SCr of 0.74 mg/dL). Liver Function Tests: Recent Labs  Lab 07/29/21 1206  AST 15  ALT 14  ALKPHOS 86  BILITOT 0.4  PROT 7.6  ALBUMIN 3.7   Recent Labs  Lab 07/29/21 1206  LIPASE 44   No results for input(s): AMMONIA in the last 168 hours. Coagulation Profile: No results for input(s): INR, PROTIME in the last 168 hours. Cardiac Enzymes: No results for input(s): CKTOTAL, CKMB, CKMBINDEX, TROPONINI in the last 168  hours. BNP (last 3 results) No results for input(s): PROBNP in the last 8760 hours. HbA1C: No results for input(s): HGBA1C in the last 72 hours. CBG: No results for input(s): GLUCAP in the last 168 hours. Lipid Profile: No results for input(s): CHOL, HDL, LDLCALC, TRIG, CHOLHDL, LDLDIRECT in the last 72 hours. Thyroid Function Tests: No results for input(s): TSH, T4TOTAL, FREET4, T3FREE, THYROIDAB in the last 72 hours. Anemia Panel: No results for input(s): VITAMINB12, FOLATE, FERRITIN, TIBC, IRON, RETICCTPCT in the last 72 hours. Urine analysis:    Component Value Date/Time   COLORURINE YELLOW 07/29/2021 1302   APPEARANCEUR CLEAR 07/29/2021 1302   APPEARANCEUR Clear 02/20/2021 1532   LABSPEC 1.015 07/29/2021 1302   PHURINE 6.0 07/29/2021 1302   GLUCOSEU NEGATIVE 07/29/2021 1302   HGBUR  SMALL (A) 07/29/2021 1302   BILIRUBINUR NEGATIVE 07/29/2021 1302   BILIRUBINUR Negative 02/20/2021 1532   KETONESUR NEGATIVE 07/29/2021 1302   PROTEINUR NEGATIVE 07/29/2021 1302   UROBILINOGEN negative 12/19/2015 1720   NITRITE NEGATIVE 07/29/2021 1302   LEUKOCYTESUR SMALL (A) 07/29/2021 1302   Sepsis Labs: @LABRCNTIP (procalcitonin:4,lacticidven:4)  ) Recent Results (from the past 240 hour(s))  SARS CORONAVIRUS 2 (TAT 6-24 HRS) Nasopharyngeal Nasopharyngeal Swab     Status: None   Collection Time: 07/29/21  3:49 PM   Specimen: Nasopharyngeal Swab  Result Value Ref Range Status   SARS Coronavirus 2 NEGATIVE NEGATIVE Final    Comment: (NOTE) SARS-CoV-2 target nucleic acids are NOT DETECTED.  The SARS-CoV-2 RNA is generally detectable in upper and lower respiratory specimens during the acute phase of infection. Negative results do not preclude SARS-CoV-2 infection, do not rule out co-infections with other pathogens, and should not be used as the sole basis for treatment or other patient management decisions. Negative results must be combined with clinical observations, patient history,  and epidemiological information. The expected result is Negative.  Fact Sheet for Patients: 09/28/21  Fact Sheet for Healthcare Providers: HairSlick.no  This test is not yet approved or cleared by the quierodirigir.com FDA and  has been authorized for detection and/or diagnosis of SARS-CoV-2 by FDA under an Emergency Use Authorization (EUA). This EUA will remain  in effect (meaning this test can be used) for the duration of the COVID-19 declaration under Se ction 564(b)(1) of the Act, 21 U.S.C. section 360bbb-3(b)(1), unless the authorization is terminated or revoked sooner.  Performed at [facility], [address] [place], [place]          Radiology Studies: CT ABDOMEN PELVIS W CONTRAST  Result Date: 07/29/2021 CLINICAL DATA:  Diarrhea for 3 weeks. EXAM: CT ABDOMEN AND PELVIS WITH CONTRAST TECHNIQUE: Multidetector CT imaging of the abdomen and pelvis was performed using the standard protocol following bolus administration of intravenous contrast. CONTRAST:  77mL OMNIPAQUE IOHEXOL 350 MG/ML SOLN COMPARISON:  Lumbar spine films on 09/24/2020 and thoracic spine films on 07/19/2021 FINDINGS: Lower chest: No acute abnormality.  Small hiatal hernia. Hepatobiliary: No focal liver abnormality is seen. No gallstones, gallbladder wall thickening, or biliary dilatation. Pancreas: Unremarkable. No pancreatic ductal dilatation or surrounding inflammatory changes. Spleen: Multiple calcified granulomata in a small spleen. Adrenals/Urinary Tract: Adrenal glands are unremarkable. Kidneys are normal, without renal calculi, focal lesion, or hydronephrosis. Bladder is unremarkable. Stomach/Bowel: Bowel shows no evidence of obstruction, ileus, inflammatory process or focal lesion. No evidence of free intraperitoneal air. Vascular/Lymphatic: Heavily calcified abdominal aorta without aneurysm. No enlarged lymph nodes identified.  Reproductive: Uterus and bilateral adnexa are unremarkable. Other: No abdominal wall hernia or abnormality. No abdominopelvic ascites. Musculoskeletal: Osteopenia with compression fractures of the T11, T12 and L1 vertebral bodies. Loss of height appears similar to the recent thoracic spine films and progressive at the T12 and L1 level since the prior lumbar spine films. Stable mild anterolisthesis of L4 on L5. IMPRESSION: 1. Small hiatal hernia 2. Calcified granulomata of the spleen 3. No acute process identified in the abdomen or pelvis. 4. Osteopenic compression fractures of the T11, T12 and L1 vertebral bodies. Electronically Signed   By: 07/21/2021 M.D.   On: 07/29/2021 15:00   DG Chest Portable 1 View  Result Date: 07/29/2021 CLINICAL DATA:  Generalized weakness and diarrhea. Shortness of breath. EXAM: PORTABLE CHEST 1 VIEW COMPARISON:  One-view chest x-ray 11/08/2020 FINDINGS: Heart size normal. Diffuse  interstitial coarsening in noted. There is slight increased to the overall pattern, suggesting mild edema. Asymmetric increase noted in the left upper lobe. Infection not excluded. IMPRESSION: 1. Increasing interstitial coarsening, suggesting mild edema. 2. Asymmetric increase in the left upper lobe. Infection not excluded. Electronically Signed   By: Marin Roberts M.D.   On: 07/29/2021 14:17        Scheduled Meds:  enoxaparin (LOVENOX) injection  40 mg Subcutaneous Q24H   feeding supplement  1 Container Oral TID BM   gabapentin  900 mg Oral QHS   guaiFENesin-dextromethorphan  10 mL Oral Q6H   losartan  25 mg Oral Daily   predniSONE  15 mg Oral Q breakfast   umeclidinium bromide  1 puff Inhalation Daily   Continuous Infusions:  sodium chloride 75 mL/hr at 07/29/21 1846   azithromycin 500 mg (07/29/21 2025)     LOS: 0 days   The patient is critically ill with multiple organ systems failure and requires high complexity decision making for assessment and support, frequent  evaluation and titration of therapies, application of advanced monitoring technologies and extensive interpretation of multiple databases. Critical Care Time devoted to patient care services described in this note  Time spent: 40 minutes     Kahmari Koller, Roselind Messier, MD [facility]   If 7PM-7AM, please contact night-coverage 07/30/2021, 12:46 PM

## 2021-07-30 NOTE — Plan of Care (Signed)
  Problem: Acute Rehab PT Goals(only PT should resolve) Goal: Pt Will Go Supine/Side To Sit Outcome: Progressing Flowsheets (Taken 07/30/2021 1053) Pt will go Supine/Side to Sit:  with modified independence  with supervision Goal: Patient Will Transfer Sit To/From Stand Outcome: Progressing Flowsheets (Taken 07/30/2021 1053) Patient will transfer sit to/from stand:  with modified independence  with supervision Goal: Pt Will Transfer Bed To Chair/Chair To Bed Outcome: Progressing Flowsheets (Taken 07/30/2021 1053) Pt will Transfer Bed to Chair/Chair to Bed: with supervision Goal: Pt Will Ambulate Outcome: Progressing Flowsheets (Taken 07/30/2021 1053) Pt will Ambulate:  50 feet  with min guard assist  with minimal assist  with rolling walker   10:54 AM, 07/30/21 Ocie Bob, MPT Physical Therapist with [facility] 336 [phone] office [phone] mobile phone

## 2021-07-30 NOTE — Evaluation (Signed)
Physical Therapy Evaluation Patient Details Name: Catherine Harrell MRN: [id number] DOB: 1946/04/27 Today's Date: 07/30/2021   History of Present Illness  Catherine Harrell is a 75 y.o. female with medical history significant for COPD, with chronic respiratory failure on 3 L, hypertension.   Patient presented today with complaints of diarrhea of 1 week duration. She reports lose and watery stools after meals, about 3 times a day.  Patient is very hard of hearing, questions are written down on paper by daughter- Tammy for patient to read.  Lives with her husband.    No pain with urination.  No vomiting.  She reported right-sided abdominal pain yesterday.  No recent antibiotics.  Daughter also reports multiple falls, last fall was about 2 weeks ago.  Patient at baseline uses a cane, she is supposed to use a walker, but because of her oxygen tank she is unable to use it.  Patient has chronic productive cough that is unchanged.  She also has chronic difficulty breathing for which her home O2 sats had to be increased from 2 to 3 L.   Clinical Impression  Patient presents with bruises/scarring on anterior left leg due to falls at home, demonstrates slightly labored movement for sitting up at bedside, appears slightly impulsive possibly due to deafness, once fatigued tends to lean over RW requiring Min assist to prevent patient from losing balance and limited for ambulation mostly due to fatigue and SOB.  Patient ambulate on 3 LPM with SpO2 at 94-96%.  Patient tolerated sitting up in chair after therapy - nursing staff aware.  Patient will benefit from continued physical therapy in hospital and recommended venue below to increase strength, balance, endurance for safe ADLs and gait.      Follow Up Recommendations SNF;Supervision for mobility/OOB;Supervision - Intermittent    Equipment Recommendations  None recommended by PT    Recommendations for Other Services       Precautions / Restrictions  Precautions Precautions: Fall Restrictions Weight Bearing Restrictions: No      Mobility  Bed Mobility Overal bed mobility: Needs Assistance Bed Mobility: Supine to Sit     Supine to sit: Min guard;Min assist     General bed mobility comments: increased time labored movement    Transfers Overall transfer level: Needs assistance Equipment used: Rolling walker (2 wheeled);1 person hand held assist Transfers: Sit to/from [employer] Sit to Stand: Min guard;Min assist Stand pivot transfers: Min assist       General transfer comment: slow labored movement  Ambulation/Gait Ambulation/Gait assistance: Min assist Gait Distance (Feet): 45 Feet Assistive device: Rolling walker (2 wheeled) Gait Pattern/deviations: Decreased step length - right;Decreased step length - left;Decreased stride length;Trunk flexed Gait velocity: decreased   General Gait Details: labored cadence with tendency to lean over RW once fatigued requiring tactile cueing and Min assist to prevent patient from losing balance  Stairs            Wheelchair Mobility    Modified Rankin (Stroke Patients Only)       Balance Overall balance assessment: Needs assistance Sitting-balance support: Feet supported;No upper extremity supported Sitting balance-Leahy Scale: Fair Sitting balance - Comments: fair/good seated at EOB   Standing balance support: During functional activity;No upper extremity supported Standing balance-Leahy Scale: Poor Standing balance comment: fair/poor without AD, fair using RW                             Pertinent Vitals/Pain Pain Assessment:  No/denies pain    Home Living Family/patient expects to be discharged to:: Private residence Living Arrangements: Spouse/significant other Available Help at Discharge: Family;Available PRN/intermittently           Home Equipment: Walker - 2 wheels Additional Comments: difficult to obtain info from  patient due to deaf    Prior Function Level of Independence: Needs assistance   Gait / Transfers Assistance Needed: household ambulator using RW PRN  ADL's / Homemaking Assistance Needed: assisted by family  Comments: had to write on note pad to communicate     Hand Dominance        Extremity/Trunk Assessment   Upper Extremity Assessment Upper Extremity Assessment: Generalized weakness    Lower Extremity Assessment Lower Extremity Assessment: Generalized weakness    Cervical / Trunk Assessment Cervical / Trunk Assessment: Kyphotic  Communication   Communication: Deaf  Cognition Arousal/Alertness: Awake/alert Behavior During Therapy: WFL for tasks assessed/performed Overall Cognitive Status: Within Functional Limits for tasks assessed                                        General Comments      Exercises     Assessment/Plan    PT Assessment Patient needs continued PT services  PT Problem List Decreased strength;Decreased activity tolerance;Decreased balance;Decreased mobility       PT Treatment Interventions DME instruction;Gait training;Stair training;Functional mobility training;Therapeutic activities;Therapeutic exercise;Patient/family education;Balance training    PT Goals (Current goals can be found in the Care Plan section)  Acute Rehab PT Goals Patient Stated Goal: return home with family to assist PT Goal Formulation: With patient Time For Goal Achievement: 08/13/21 Potential to Achieve Goals: Good    Frequency Min 3X/week   Barriers to discharge        Co-evaluation               AM-PAC PT "6 Clicks" Mobility  Outcome Measure Help needed turning from your back to your side while in a flat bed without using bedrails?: None Help needed moving from lying on your back to sitting on the side of a flat bed without using bedrails?: A Little Help needed moving to and from a bed to a chair (including a wheelchair)?: A  Little Help needed standing up from a chair using your arms (e.g., wheelchair or bedside chair)?: A Little Help needed to walk in hospital room?: A Lot Help needed climbing 3-5 steps with a railing? : A Lot 6 Click Score: 17    End of Session Equipment Utilized During Treatment: Oxygen Activity Tolerance: Patient tolerated treatment well;Patient limited by fatigue Patient left: in chair;with call bell/phone within reach Nurse Communication: Mobility status PT Visit Diagnosis: Unsteadiness on feet (R26.81);Other abnormalities of gait and mobility (R26.89);Muscle weakness (generalized) (M62.81)    Time: 9211-9417 PT Time Calculation (min) (ACUTE ONLY): 32 min   Charges:   PT Evaluation $PT Eval Moderate Complexity: 1 Mod PT Treatments $Therapeutic Activity: 23-37 mins        10:51 AM, 07/30/21 Ocie Bob, MPT Physical Therapist with [facility] 336 [phone] office [phone] mobile phone

## 2021-07-30 NOTE — Plan of Care (Signed)
Pt is from home. Pt has had no loose bowel movements during admission.  No complaints of pain or discomfort noted.  Pt remains on fall risk precautions.  Encouraged pt to turn self every two hours to prevent breakdown.  Problem: Education: Goal: Knowledge of General Education information will improve Description: Including pain rating scale, medication(s)/side effects and non-pharmacologic comfort measures Outcome: Progressing   Problem: Health Behavior/Discharge Planning: Goal: Ability to manage health-related needs will improve Outcome: Progressing   Problem: Clinical Measurements: Goal: Ability to maintain clinical measurements within normal limits will improve Outcome: Progressing Goal: Will remain free from infection Outcome: Progressing Goal: Diagnostic test results will improve Outcome: Progressing Goal: Respiratory complications will improve Outcome: Progressing Goal: Cardiovascular complication will be avoided Outcome: Progressing   Problem: Activity: Goal: Risk for activity intolerance will decrease Outcome: Progressing   Problem: Nutrition: Goal: Adequate nutrition will be maintained Outcome: Progressing   Problem: Coping: Goal: Level of anxiety will decrease Outcome: Progressing   Problem: Elimination: Goal: Will not experience complications related to bowel motility Outcome: Progressing Goal: Will not experience complications related to urinary retention Outcome: Progressing   Problem: Pain Managment: Goal: General experience of comfort will improve Outcome: Progressing   Problem: Safety: Goal: Ability to remain free from injury will improve Outcome: Progressing   Problem: Skin Integrity: Goal: Risk for impaired skin integrity will decrease Outcome: Progressing

## 2021-07-31 ENCOUNTER — Other Ambulatory Visit: Payer: Self-pay | Admitting: Family Medicine

## 2021-07-31 ENCOUNTER — Inpatient Hospital Stay (HOSPITAL_COMMUNITY): Payer: Medicare HMO

## 2021-07-31 DIAGNOSIS — R197 Diarrhea, unspecified: Secondary | ICD-10-CM | POA: Diagnosis not present

## 2021-07-31 DIAGNOSIS — J9611 Chronic respiratory failure with hypoxia: Secondary | ICD-10-CM

## 2021-07-31 DIAGNOSIS — J449 Chronic obstructive pulmonary disease, unspecified: Secondary | ICD-10-CM | POA: Diagnosis not present

## 2021-07-31 DIAGNOSIS — I10 Essential (primary) hypertension: Secondary | ICD-10-CM | POA: Diagnosis not present

## 2021-07-31 DIAGNOSIS — J411 Mucopurulent chronic bronchitis: Secondary | ICD-10-CM

## 2021-07-31 DIAGNOSIS — M810 Age-related osteoporosis without current pathological fracture: Secondary | ICD-10-CM

## 2021-07-31 LAB — COMPREHENSIVE METABOLIC PANEL
ALT: 12 U/L (ref 0–44)
AST: 13 U/L — ABNORMAL LOW (ref 15–41)
Albumin: 2.6 g/dL — ABNORMAL LOW (ref 3.5–5.0)
Alkaline Phosphatase: 57 U/L (ref 38–126)
Anion gap: 6 (ref 5–15)
BUN: 8 mg/dL (ref 8–23)
CO2: 31 mmol/L (ref 22–32)
Calcium: 9 mg/dL (ref 8.9–10.3)
Chloride: 93 mmol/L — ABNORMAL LOW (ref 98–111)
Creatinine, Ser: 0.56 mg/dL (ref 0.44–1.00)
GFR, Estimated: >60 mL/min (ref >60)
Glucose, Bld: 106 mg/dL — ABNORMAL HIGH (ref 70–99)
Potassium: 4.9 mmol/L (ref 3.5–5.1)
Sodium: 130 mmol/L — ABNORMAL LOW (ref 135–145)
Total Bilirubin: 0.4 mg/dL (ref 0.3–1.2)
Total Protein: 5.4 g/dL — ABNORMAL LOW (ref 6.5–8.1)

## 2021-07-31 LAB — CBC WITH DIFFERENTIAL/PLATELET
Abs Immature Granulocytes: 0.05 10*3/uL (ref 0.00–0.07)
Basophils Absolute: 0 10*3/uL (ref 0.0–0.1)
Basophils Relative: 0 %
Eosinophils Absolute: 0 10*3/uL (ref 0.0–0.5)
Eosinophils Relative: 0 %
HCT: 34.8 % — ABNORMAL LOW (ref 36.0–46.0)
Hemoglobin: 11 g/dL — ABNORMAL LOW (ref 12.0–15.0)
Immature Granulocytes: 1 %
Lymphocytes Relative: 19 %
Lymphs Abs: 1.2 10*3/uL (ref 0.7–4.0)
MCH: 30.5 pg (ref 26.0–34.0)
MCHC: 31.6 g/dL (ref 30.0–36.0)
MCV: 96.4 fL (ref 80.0–100.0)
Monocytes Absolute: 0.3 10*3/uL (ref 0.1–1.0)
Monocytes Relative: 5 %
Neutro Abs: 4.8 10*3/uL (ref 1.7–7.7)
Neutrophils Relative %: 75 %
Platelets: 301 10*3/uL (ref 150–400)
RBC: 3.61 MIL/uL — ABNORMAL LOW (ref 3.87–5.11)
RDW: 12.5 % (ref 11.5–15.5)
WBC: 6.4 10*3/uL (ref 4.0–10.5)
nRBC: 0 % (ref 0.0–0.2)

## 2021-07-31 LAB — C DIFFICILE QUICK SCREEN W PCR REFLEX
C Diff antigen: NEGATIVE
C Diff interpretation: NOT DETECTED
C Diff toxin: NEGATIVE

## 2021-07-31 LAB — OCCULT BLOOD X 1 CARD TO LAB, STOOL: Fecal Occult Bld: POSITIVE — AB

## 2021-07-31 LAB — PHOSPHORUS: Phosphorus: 3.7 mg/dL (ref 2.5–4.6)

## 2021-07-31 LAB — MAGNESIUM: Magnesium: 1.8 mg/dL (ref 1.7–2.4)

## 2021-07-31 NOTE — TOC Progression Note (Signed)
Transition of Care [facility]) - Progression Note    Patient Details  Name: Ralene Gasparyan MRN: [id number] Date of Birth: 07-10-1946  [facility] CM/SW Contact  Leitha Bleak, RN Phone Number: 07/31/2021, 4:55 PM  Clinical Narrative:   Son is agreeable to home health. [facility] Custard accepted the referral for HHPT. MD updated asked for orders. TOC to follow   Expected Discharge Plan: Home w Home Health Services Barriers to Discharge: Continued Medical Work up  Expected Discharge Plan and Services Expected Discharge Plan: Home w Home Health Services     Post Acute Care Choice: Home Health         HH Arranged: PT [facility] Agency: [facility] Date [facility] Agency Contacted: 07/31/21 Time HH Agency Contacted: 1655 Representative spoke with at [facility] Agency: Clif[facility] Custard   Readmission Risk Interventions Readmission Risk Prevention Plan 07/31/2021  Medication Screening Complete  Transportation Screening Complete  Some recent data might be hidden

## 2021-07-31 NOTE — Progress Notes (Signed)
PROGRESS NOTE    Leeana Creer  QHU:[id number] DOB: [birth year] DOA: 07/29/2021 PCP: [name], MD   Brief Narrative:  75 year old female PMHx COPD, chronic respiratory failure with hypoxia on 2 L O2 at home, HTN, LEFT TMJ arthralgia, osteoporosis. Patient presented today with complaints of diarrhea of 1 week duration.  Upon admission diarrhea has resolved but she had significantly abnormal breath sounds requiring Solu-Medrol and bronchodilators.   Assessment & Plan:   Principal Problem:   Diarrhea Active Problems:   End stage COPD (HCC)   Essential hypertension   Osteoporosis   Chronic respiratory failure with hypoxia (HCC)  Acute COPD exacerbation Chronic hypoxic respiratory failure on 2 L nasal cannula - She still has bilateral rhonchi left greater than right.  Continue Solu-Medrol IV daily, bronchodilators, I-S/flutter  Diarrhea - Nonspecific.  CT abdomen pelvis negative.  Stool studies never sent as patient did not have any further symptoms in the hospital.  It has resolved.  Essential hypertension - Losartan  Chronic pain - Gabapentin bedtime  PT/OT is recommended SNF but patient would like to go home when medically stable   DVT prophylaxis: Lovenox Code Status: DNR Family Communication: Attempted to call daughter but no answer  Status is: Inpatient  Remains inpatient appropriate because:Inpatient level of care appropriate due to severity of illness  Dispo: The patient is from: Home              Anticipated d/c is to: Home              Patient currently is not medically stable to d/c.  Still has significantly abnormal breath sounds requiring IV steroids   Difficult to place patient No         Subjective: Very hard of hearing, communicating by writing on the book.  No new complaints but still has abnormal breath sounds  Review of Systems Otherwise negative except as per HPI, including: General: Denies fever, chills, night sweats or unintended  weight loss. Resp: Denies cough, wheezing, shortness of breath. Cardiac: Denies chest pain, palpitations, orthopnea, paroxysmal nocturnal dyspnea. GI: Denies abdominal pain, nausea, vomiting, diarrhea or constipation GU: Denies dysuria, frequency, hesitancy or incontinence MS: Denies muscle aches, joint pain or swelling Neuro: Denies headache, neurologic deficits (focal weakness, numbness, tingling), abnormal gait Psych: Denies anxiety, depression, SI/HI/AVH Skin: Denies new rashes or lesions ID: Denies sick contacts, exotic exposures, travel  Examination:  General exam: Appears calm and comfortable, very hard of hearing Respiratory system: Bilateral rhonchi left greater than right Cardiovascular system: S1 & S2 heard, RRR. No JVD, murmurs, rubs, gallops or clicks. No pedal edema. Gastrointestinal system: Abdomen is nondistended, soft and nontender. No organomegaly or masses felt. Normal bowel sounds heard. Central nervous system: Alert and oriented. No focal neurological deficits. Extremities: Symmetric 5 x 5 power. Skin: No rashes, lesions or ulcers Psychiatry: Judgement and insight appear normal. Mood & affect appropriate.     Objective: Vitals:   07/30/21 1951 07/30/21 2107 07/31/21 0329 07/31/21 0726  BP:  (!) 123/105 134/84   Pulse:  92 86   Resp:  19 19   Temp:  98 F (36.7 C) 98.3 F (36.8 C)   TempSrc:  Oral    SpO2: 91% 99% 98% 98%  Weight:      Height:        Intake/Output Summary (Last 24 hours) at 07/31/2021 0918 Last data filed at 07/31/2021 0555 Gross per 24 hour  Intake 1420 ml  Output 200 ml  Net 1220 ml  Filed Weights   07/29/21 1145  Weight: 56.7 kg     Data Reviewed:   CBC: Recent Labs  Lab 07/29/21 1206 07/30/21 0610 07/31/21 0603  WBC 14.9* 9.0 6.4  NEUTROABS  --   --  4.8  HGB 13.9 11.2* 11.0*  HCT 45.0 37.3 34.8*  MCV 96.4 97.4 96.4  PLT 425* 312 301   Basic Metabolic Panel: Recent Labs  Lab 07/29/21 1206 07/31/21 0603  NA  133* 130*  K 4.4 4.9  CL 92* 93*  CO2 34* 31  GLUCOSE 105* 106*  BUN 10 8  CREATININE 0.74 0.56  CALCIUM 9.3 9.0  MG  --  1.8  PHOS  --  3.7   GFR: Estimated Creatinine Clearance: 53.6 mL/min (by C-G formula based on SCr of 0.56 mg/dL). Liver Function Tests: Recent Labs  Lab 07/29/21 1206 07/31/21 0603  AST 15 13*  ALT 14 12  ALKPHOS 86 57  BILITOT 0.4 0.4  PROT 7.6 5.4*  ALBUMIN 3.7 2.6*   Recent Labs  Lab 07/29/21 1206  LIPASE 44   No results for input(s): AMMONIA in the last 168 hours. Coagulation Profile: No results for input(s): INR, PROTIME in the last 168 hours. Cardiac Enzymes: No results for input(s): CKTOTAL, CKMB, CKMBINDEX, TROPONINI in the last 168 hours. BNP (last 3 results) No results for input(s): PROBNP in the last 8760 hours. HbA1C: No results for input(s): HGBA1C in the last 72 hours. CBG: No results for input(s): GLUCAP in the last 168 hours. Lipid Profile: No results for input(s): CHOL, HDL, LDLCALC, TRIG, CHOLHDL, LDLDIRECT in the last 72 hours. Thyroid Function Tests: No results for input(s): TSH, T4TOTAL, FREET4, T3FREE, THYROIDAB in the last 72 hours. Anemia Panel: No results for input(s): VITAMINB12, FOLATE, FERRITIN, TIBC, IRON, RETICCTPCT in the last 72 hours. Sepsis Labs: No results for input(s): PROCALCITON, LATICACIDVEN in the last 168 hours.  Recent Results (from the past 240 hour(s))  SARS CORONAVIRUS 2 (TAT 6-24 HRS) Nasopharyngeal Nasopharyngeal Swab     Status: None   Collection Time: 07/29/21  3:49 PM   Specimen: Nasopharyngeal Swab  Result Value Ref Range Status   SARS Coronavirus 2 NEGATIVE NEGATIVE Final    Comment: (NOTE) SARS-CoV-2 target nucleic acids are NOT DETECTED.  The SARS-CoV-2 RNA is generally detectable in upper and lower respiratory specimens during the acute phase of infection. Negative results do not preclude SARS-CoV-2 infection, do not rule out co-infections with other pathogens, and should not be  used as the sole basis for treatment or other patient management decisions. Negative results must be combined with clinical observations, patient history, and epidemiological information. The expected result is Negative.  Fact Sheet for Patients: HairSlick.no  Fact Sheet for Healthcare Providers: quierodirigir.com  This test is not yet approved or cleared by the [place] FDA and  has been authorized for detection and/or diagnosis of SARS-CoV-2 by FDA under an Emergency Use Authorization (EUA). This EUA will remain  in effect (meaning this test can be used) for the duration of the COVID-19 declaration under Se ction 564(b)(1) of the Act, 21 U.S.C. section 360bbb-3(b)(1), unless the authorization is terminated or revoked sooner.  Performed at [facility], [address], [place], [place] [address]          Radiology Studies: CT ABDOMEN PELVIS W CONTRAST  Result Date: 07/29/2021 CLINICAL DATA:  Diarrhea for 3 weeks. EXAM: CT ABDOMEN AND PELVIS WITH CONTRAST TECHNIQUE: Multidetector CT imaging of the abdomen and pelvis was performed  using the standard protocol following bolus administration of intravenous contrast. CONTRAST:  59mL OMNIPAQUE IOHEXOL 350 MG/ML SOLN COMPARISON:  Lumbar spine films on 09/24/2020 and thoracic spine films on 07/19/2021 FINDINGS: Lower chest: No acute abnormality.  Small hiatal hernia. Hepatobiliary: No focal liver abnormality is seen. No gallstones, gallbladder wall thickening, or biliary dilatation. Pancreas: Unremarkable. No pancreatic ductal dilatation or surrounding inflammatory changes. Spleen: Multiple calcified granulomata in a small spleen. Adrenals/Urinary Tract: Adrenal glands are unremarkable. Kidneys are normal, without renal calculi, focal lesion, or hydronephrosis. Bladder is unremarkable. Stomach/Bowel: Bowel shows no evidence of obstruction, ileus, inflammatory process or focal  lesion. No evidence of free intraperitoneal air. Vascular/Lymphatic: Heavily calcified abdominal aorta without aneurysm. No enlarged lymph nodes identified. Reproductive: Uterus and bilateral adnexa are unremarkable. Other: No abdominal wall hernia or abnormality. No abdominopelvic ascites. Musculoskeletal: Osteopenia with compression fractures of the T11, T12 and L1 vertebral bodies. Loss of height appears similar to the recent thoracic spine films and progressive at the T12 and L1 level since the prior lumbar spine films. Stable mild anterolisthesis of L4 on L5. IMPRESSION: 1. Small hiatal hernia 2. Calcified granulomata of the spleen 3. No acute process identified in the abdomen or pelvis. 4. Osteopenic compression fractures of the T11, T12 and L1 vertebral bodies. Electronically Signed   By: Irish Lack M.D.   On: 07/29/2021 15:00   DG Chest Portable 1 View  Result Date: 07/29/2021 CLINICAL DATA:  Generalized weakness and diarrhea. Shortness of breath. EXAM: PORTABLE CHEST 1 VIEW COMPARISON:  One-view chest x-ray 11/08/2020 FINDINGS: Heart size normal. Diffuse interstitial coarsening in noted. There is slight increased to the overall pattern, suggesting mild edema. Asymmetric increase noted in the left upper lobe. Infection not excluded. IMPRESSION: 1. Increasing interstitial coarsening, suggesting mild edema. 2. Asymmetric increase in the left upper lobe. Infection not excluded. Electronically Signed   By: Marin Roberts M.D.   On: 07/29/2021 14:17        Scheduled Meds:  enoxaparin (LOVENOX) injection  40 mg Subcutaneous Q24H   feeding supplement  1 Container Oral TID BM   gabapentin  900 mg Oral QHS   ipratropium-albuterol  3 mL Nebulization QID   losartan  25 mg Oral Daily   methylPREDNISolone (SOLU-MEDROL) injection  60 mg Intravenous Daily   Continuous Infusions:  azithromycin 500 mg (07/30/21 1547)     LOS: 1 day   Time spent= 35 mins    Tesa Meadors Joline Maxcy, MD [facility]  If 7PM-7AM, please contact night-coverage  07/31/2021, 9:18 AM

## 2021-08-01 DIAGNOSIS — R197 Diarrhea, unspecified: Secondary | ICD-10-CM | POA: Diagnosis not present

## 2021-08-01 DIAGNOSIS — I10 Essential (primary) hypertension: Secondary | ICD-10-CM | POA: Diagnosis not present

## 2021-08-01 DIAGNOSIS — J9611 Chronic respiratory failure with hypoxia: Secondary | ICD-10-CM | POA: Diagnosis not present

## 2021-08-01 DIAGNOSIS — J449 Chronic obstructive pulmonary disease, unspecified: Secondary | ICD-10-CM | POA: Diagnosis not present

## 2021-08-01 LAB — COMPREHENSIVE METABOLIC PANEL
ALT: 13 U/L (ref 0–44)
AST: 14 U/L — ABNORMAL LOW (ref 15–41)
Albumin: 2.8 g/dL — ABNORMAL LOW (ref 3.5–5.0)
Alkaline Phosphatase: 61 U/L (ref 38–126)
Anion gap: 8 (ref 5–15)
BUN: 9 mg/dL (ref 8–23)
CO2: 29 mmol/L (ref 22–32)
Calcium: 9 mg/dL (ref 8.9–10.3)
Chloride: 92 mmol/L — ABNORMAL LOW (ref 98–111)
Creatinine, Ser: 0.55 mg/dL (ref 0.44–1.00)
GFR, Estimated: >60 mL/min (ref >60)
Glucose, Bld: 94 mg/dL (ref 70–99)
Potassium: 4.2 mmol/L (ref 3.5–5.1)
Sodium: 129 mmol/L — ABNORMAL LOW (ref 135–145)
Total Bilirubin: 0.5 mg/dL (ref 0.3–1.2)
Total Protein: 5.8 g/dL — ABNORMAL LOW (ref 6.5–8.1)

## 2021-08-01 LAB — PHOSPHORUS: Phosphorus: 2.5 mg/dL (ref 2.5–4.6)

## 2021-08-01 LAB — CBC WITH DIFFERENTIAL/PLATELET
Abs Immature Granulocytes: 0.08 10*3/uL — ABNORMAL HIGH (ref 0.00–0.07)
Basophils Absolute: 0 10*3/uL (ref 0.0–0.1)
Basophils Relative: 0 %
Eosinophils Absolute: 0 10*3/uL (ref 0.0–0.5)
Eosinophils Relative: 0 %
HCT: 36.2 % (ref 36.0–46.0)
Hemoglobin: 11.3 g/dL — ABNORMAL LOW (ref 12.0–15.0)
Immature Granulocytes: 1 %
Lymphocytes Relative: 17 %
Lymphs Abs: 1.4 10*3/uL (ref 0.7–4.0)
MCH: 29.3 pg (ref 26.0–34.0)
MCHC: 31.2 g/dL (ref 30.0–36.0)
MCV: 93.8 fL (ref 80.0–100.0)
Monocytes Absolute: 0.9 10*3/uL (ref 0.1–1.0)
Monocytes Relative: 11 %
Neutro Abs: 6 10*3/uL (ref 1.7–7.7)
Neutrophils Relative %: 71 %
Platelets: 286 10*3/uL (ref 150–400)
RBC: 3.86 MIL/uL — ABNORMAL LOW (ref 3.87–5.11)
RDW: 12.3 % (ref 11.5–15.5)
WBC: 8.4 10*3/uL (ref 4.0–10.5)
nRBC: 0 % (ref 0.0–0.2)

## 2021-08-01 LAB — MAGNESIUM: Magnesium: 1.8 mg/dL (ref 1.7–2.4)

## 2021-08-01 MED ORDER — AZITHROMYCIN 250 MG PO TABS: 250.0000 mg | ORAL_TABLET | Freq: Every day | ORAL | 0 refills | Status: AC

## 2021-08-01 MED ORDER — PREDNISONE 20 MG PO TABS: 40.0000 mg | ORAL_TABLET | Freq: Every day | ORAL | 0 refills | Status: AC

## 2021-08-01 MED ORDER — AZITHROMYCIN 250 MG PO TABS
500.0000 mg | ORAL_TABLET | Freq: Every day | ORAL | Status: DC
  Administered 2021-08-01: 500 mg via ORAL
  Filled 2021-08-01: qty 2

## 2021-08-01 NOTE — Discharge Summary (Signed)
Physician Discharge Summary  Catherine Harrell MMN:[id number] DOB: [birth year] DOA: 07/29/2021  PCP: [name], MD  Admit date: 07/29/2021 Discharge date: 08/01/2021  Admitted From: Home Disposition: Home  Recommendations for Outpatient Follow-up:  Follow up with PCP in 1-2 weeks Please obtain BMP/CBC in one week your next doctors visit.  Oral azithromycin for 2 more days Prednisone 40 mg p.o. daily for 4 days after resume 15 mg daily   Discharge Condition: Stable CODE STATUS: DNR Diet recommendation: 2 g salt  Brief/Interim Summary: 75 year old female PMHx COPD, chronic respiratory failure with hypoxia on 2 L O2 at home, HTN, LEFT TMJ arthralgia, osteoporosis. Patient presented today with complaints of diarrhea of 1 week duration.  Upon admission diarrhea has resolved but she had significantly abnormal breath sounds requiring Solu-Medrol and bronchodilators.  Eventually she was weaned off IV steroids to p.o. prednisone, her breath sounds improved.  Diarrhea resolved in the hospital, she was negative for C. difficile. Stable for discharge today     Assessment & Plan:   Principal Problem:   Diarrhea Active Problems:   End stage COPD (HCC)   Essential hypertension   Osteoporosis   Chronic respiratory failure with hypoxia (HCC)   Acute COPD exacerbation Chronic hypoxic respiratory failure on 2 L nasal cannula - Breath sounds are significantly improved, transition to p.o. prednisone 40 mg daily for 4 more days thereafter resume home 15 mg orally daily.  Bronchodilators.   Diarrhea, resolved - Nonspecific.  CT abdomen pelvis negative.  Stool studies never sent as patient did not have any further symptoms in the hospital.  It has resolved.  C. difficile is negative  Positive Hemoccult - Stable hemoglobin.  Recommend outpatient follow-up with PCP   Essential hypertension - Losartan  Chronic pain - Gabapentin bedtime   PT/OT is recommended SNF but patient would like to go home    Body mass index is 20.8 kg/m.         Discharge Diagnoses:  Principal Problem:   Diarrhea Active Problems:   End stage COPD (HCC)   Essential hypertension   Osteoporosis   Chronic respiratory failure with hypoxia (HCC)      Consultations: None  Subjective: Feels great wants to go home.  Discharge Exam: Vitals:   08/01/21 0754 08/01/21 1058  BP:    Pulse:    Resp:    Temp:    SpO2: 99% 99%   Vitals:   07/31/21 2050 08/01/21 0457 08/01/21 0754 08/01/21 1058  BP:  (!) 148/81    Pulse:  88    Resp:  19    Temp:  97.9 F (36.6 C)    TempSrc:  Oral    SpO2: 94% 100% 99% 99%  Weight:      Height:        General: Pt is alert, awake, not in acute distress, hard of hearing Cardiovascular: RRR, S1/S2 +, no rubs, no gallops Respiratory: Very minimal bilateral rhonchi Abdominal: Soft, NT, ND, bowel sounds + Extremities: no edema, no cyanosis  Discharge Instructions   Allergies as of 08/01/2021       Reactions   Levofloxacin         Medication List     STOP taking these medications    morphine 15 MG tablet Commonly known as: MSIR       TAKE these medications    acetaminophen 325 MG tablet Commonly known as: TYLENOL Take 650 mg by mouth every 6 (six) hours as needed.   azithromycin 250 MG tablet  Commonly known as: ZITHROMAX Take 1 tablet (250 mg total) by mouth daily for 2 days.   Calcium 1200 1200-1000 MG-UNIT Chew Chew 1 tablet by mouth daily.   cholecalciferol 25 MCG (1000 UNIT) tablet Commonly known as: VITAMIN D3 Take 1,000 Units by mouth daily.   gabapentin 300 MG capsule Commonly known as: NEURONTIN Take 3 capsules (900 mg total) by mouth at bedtime.   ipratropium-albuterol 0.5-2.5 (3) MG/3ML Soln Commonly known as: DUONEB Take 3 mLs by nebulization every 4 (four) hours as needed. INHALE CONTENTS OF 1 VIAL VIA NEBULIZER EVERY 4 HOURS AS NEEDED   losartan 25 MG tablet Commonly known as: COZAAR Take 1 tablet (25 mg  total) by mouth daily.   predniSONE 5 MG tablet Commonly known as: DELTASONE TAKE 3 TABLETS (15 MG TOTAL) BY MOUTH DAILY WITH BREAKFAST. What changed: Another medication with the same name was added. Make sure you understand how and when to take each.   predniSONE 20 MG tablet Commonly known as: DELTASONE Take 2 tablets (40 mg total) by mouth daily with breakfast for 4 days. What changed: You were already taking a medication with the same name, and this prescription was added. Make sure you understand how and when to take each.   Spiriva Respimat 2.5 MCG/ACT Aers Generic drug: Tiotropium Bromide Monohydrate Inhale 2 puffs into the lungs daily.        Follow-up Information     [facility] Follow up.   Specialty: Home Health Services Why: PT will call to schedule your first home visit with 48 hour of discharge. Contact information: [address] [place] [address] [phone]         [name], MD Follow up in 1 week(s).   Specialty: Family Medicine Contact information: [address] [place] [address] [phone]                Allergies  Allergen Reactions   Levofloxacin     You were cared for by a hospitalist during your hospital stay. If you have any questions about your discharge medications or the care you received while you were in the hospital after you are discharged, you can call the unit and asked to speak with the hospitalist on call if the hospitalist that took care of you is not available. Once you are discharged, your primary care physician will handle any further medical issues. Please note that no refills for any discharge medications will be authorized once you are discharged, as it is imperative that you return to your primary care physician (or establish a relationship with a primary care physician if you do not have one) for your aftercare needs so that they can reassess your need for medications and monitor your  lab values.   Procedures/Studies: DG Abd 1 View  Result Date: 07/31/2021 CLINICAL DATA:  75 year old female with history of diarrhea. EXAM: ABDOMEN - 1 VIEW COMPARISON:  No priors. FINDINGS: Single view of the abdomen demonstrates no pathologic dilatation of small bowel or colon. There are innumerable nondilated loops of gas-filled small bowel throughout the central abdomen. Gas and stool are noted in the colon extending to the level the distal rectum. No pneumoperitoneum. Extensive aortic atherosclerosis. IMPRESSION: 1. Nonspecific, nonobstructive bowel gas pattern, as above. 2. No pneumoperitoneum. 3. Aortic atherosclerosis. Electronically Signed   By: Trudie Reed M.D.   On: 07/31/2021 18:57   CT ABDOMEN PELVIS W CONTRAST  Result Date: 07/29/2021 CLINICAL DATA:  Diarrhea for 3  weeks. EXAM: CT ABDOMEN AND PELVIS WITH CONTRAST TECHNIQUE: Multidetector CT imaging of the abdomen and pelvis was performed using the standard protocol following bolus administration of intravenous contrast. CONTRAST:  3mL OMNIPAQUE IOHEXOL 350 MG/ML SOLN COMPARISON:  Lumbar spine films on 09/24/2020 and thoracic spine films on 07/19/2021 FINDINGS: Lower chest: No acute abnormality.  Small hiatal hernia. Hepatobiliary: No focal liver abnormality is seen. No gallstones, gallbladder wall thickening, or biliary dilatation. Pancreas: Unremarkable. No pancreatic ductal dilatation or surrounding inflammatory changes. Spleen: Multiple calcified granulomata in a small spleen. Adrenals/Urinary Tract: Adrenal glands are unremarkable. Kidneys are normal, without renal calculi, focal lesion, or hydronephrosis. Bladder is unremarkable. Stomach/Bowel: Bowel shows no evidence of obstruction, ileus, inflammatory process or focal lesion. No evidence of free intraperitoneal air. Vascular/Lymphatic: Heavily calcified abdominal aorta without aneurysm. No enlarged lymph nodes identified. Reproductive: Uterus and bilateral adnexa are unremarkable.  Other: No abdominal wall hernia or abnormality. No abdominopelvic ascites. Musculoskeletal: Osteopenia with compression fractures of the T11, T12 and L1 vertebral bodies. Loss of height appears similar to the recent thoracic spine films and progressive at the T12 and L1 level since the prior lumbar spine films. Stable mild anterolisthesis of L4 on L5. IMPRESSION: 1. Small hiatal hernia 2. Calcified granulomata of the spleen 3. No acute process identified in the abdomen or pelvis. 4. Osteopenic compression fractures of the T11, T12 and L1 vertebral bodies. Electronically Signed   By: Irish Lack M.D.   On: 07/29/2021 15:00   DG Chest Portable 1 View  Result Date: 07/29/2021 CLINICAL DATA:  Generalized weakness and diarrhea. Shortness of breath. EXAM: PORTABLE CHEST 1 VIEW COMPARISON:  One-view chest x-ray 11/08/2020 FINDINGS: Heart size normal. Diffuse interstitial coarsening in noted. There is slight increased to the overall pattern, suggesting mild edema. Asymmetric increase noted in the left upper lobe. Infection not excluded. IMPRESSION: 1. Increasing interstitial coarsening, suggesting mild edema. 2. Asymmetric increase in the left upper lobe. Infection not excluded. Electronically Signed   By: Marin Roberts M.D.   On: 07/29/2021 14:17   XR Thoracic Spine 2 View  Result Date: 07/19/2021 X-rays of the thoracic spine reveal at least 6 new compression fractures compared to CT images from 2021.    The results of significant diagnostics from this hospitalization (including imaging, microbiology, ancillary and laboratory) are listed below for reference.     Microbiology: Recent Results (from the past 240 hour(s))  SARS CORONAVIRUS 2 (TAT 6-24 HRS) Nasopharyngeal Nasopharyngeal Swab     Status: None   Collection Time: 07/29/21  3:49 PM   Specimen: Nasopharyngeal Swab  Result Value Ref Range Status   SARS Coronavirus 2 NEGATIVE NEGATIVE Final    Comment: (NOTE) SARS-CoV-2 target nucleic  acids are NOT DETECTED.  The SARS-CoV-2 RNA is generally detectable in upper and lower respiratory specimens during the acute phase of infection. Negative results do not preclude SARS-CoV-2 infection, do not rule out co-infections with other pathogens, and should not be used as the sole basis for treatment or other patient management decisions. Negative results must be combined with clinical observations, patient history, and epidemiological information. The expected result is Negative.  Fact Sheet for Patients: HairSlick.no  Fact Sheet for Healthcare Providers: quierodirigir.com  This test is not yet approved or cleared by the [place] FDA and  has been authorized for detection and/or diagnosis of SARS-CoV-2 by FDA under an Emergency Use Authorization (EUA). This EUA will remain  in effect (meaning this test can be used) for the duration of the  COVID-19 declaration under Se ction 564(b)(1) of the Act, 21 U.S.C. section 360bbb-3(b)(1), unless the authorization is terminated or revoked sooner.  Performed at [facility], [address] [place], [place] [address]   C Difficile Quick Screen w PCR reflex     Status: None   Collection Time: 07/31/21  4:15 PM   Specimen: STOOL  Result Value Ref Range Status   C Diff antigen NEGATIVE NEGATIVE Final   C Diff toxin NEGATIVE NEGATIVE Final   C Diff interpretation No C. difficile detected.  Final    Comment: Performed at [facility], [address] [place], [place] [address]     Labs: BNP (last 3 results) Recent Labs    11/08/20 1459  BNP 76.0   Basic Metabolic Panel: Recent Labs  Lab 07/29/21 1206 07/31/21 0603 08/01/21 0657  NA 133* 130* 129*  K 4.4 4.9 4.2  CL 92* 93* 92*  CO2 34* 31 29  GLUCOSE 105* 106* 94  BUN 10 8 9   CREATININE 0.74 0.56 0.55  CALCIUM 9.3 9.0 9.0  MG  --  1.8 1.8  PHOS  --  3.7 2.5   Liver Function Tests: Recent Labs  Lab  07/29/21 1206 07/31/21 0603 08/01/21 0657  AST 15 13* 14*  ALT 14 12 13   ALKPHOS 86 57 61  BILITOT 0.4 0.4 0.5  PROT 7.6 5.4* 5.8*  ALBUMIN 3.7 2.6* 2.8*   Recent Labs  Lab 07/29/21 1206  LIPASE 44   No results for input(s): AMMONIA in the last 168 hours. CBC: Recent Labs  Lab 07/29/21 1206 07/30/21 0610 07/31/21 0603 08/01/21 0657  WBC 14.9* 9.0 6.4 8.4  NEUTROABS  --   --  4.8 6.0  HGB 13.9 11.2* 11.0* 11.3*  HCT 45.0 37.3 34.8* 36.2  MCV 96.4 97.4 96.4 93.8  PLT 425* 312 301 286   Cardiac Enzymes: No results for input(s): CKTOTAL, CKMB, CKMBINDEX, TROPONINI in the last 168 hours. BNP: Invalid input(s): POCBNP CBG: No results for input(s): GLUCAP in the last 168 hours. D-Dimer No results for input(s): DDIMER in the last 72 hours. Hgb A1c No results for input(s): HGBA1C in the last 72 hours. Lipid Profile No results for input(s): CHOL, HDL, LDLCALC, TRIG, CHOLHDL, LDLDIRECT in the last 72 hours. Thyroid function studies No results for input(s): TSH, T4TOTAL, T3FREE, THYROIDAB in the last 72 hours.  Invalid input(s): FREET3 Anemia work up No results for input(s): VITAMINB12, FOLATE, FERRITIN, TIBC, IRON, RETICCTPCT in the last 72 hours. Urinalysis    Component Value Date/Time   COLORURINE YELLOW 07/29/2021 1302   APPEARANCEUR CLEAR 07/29/2021 1302   APPEARANCEUR Clear 02/20/2021 1532   LABSPEC 1.015 07/29/2021 1302   PHURINE 6.0 07/29/2021 1302   GLUCOSEU NEGATIVE 07/29/2021 1302   HGBUR SMALL (A) 07/29/2021 1302   BILIRUBINUR NEGATIVE 07/29/2021 1302   BILIRUBINUR Negative 02/20/2021 1532   KETONESUR NEGATIVE 07/29/2021 1302   PROTEINUR NEGATIVE 07/29/2021 1302   UROBILINOGEN negative 12/19/2015 1720   NITRITE NEGATIVE 07/29/2021 1302   LEUKOCYTESUR SMALL (A) 07/29/2021 1302   Sepsis Labs Invalid input(s): PROCALCITONIN,  WBC,  LACTICIDVEN Microbiology Recent Results (from the past 240 hour(s))  SARS CORONAVIRUS 2 (TAT 6-24 HRS) Nasopharyngeal  Nasopharyngeal Swab     Status: None   Collection Time: 07/29/21  3:49 PM   Specimen: Nasopharyngeal Swab  Result Value Ref Range Status   SARS Coronavirus 2 NEGATIVE NEGATIVE Final    Comment: (NOTE) SARS-CoV-2 target nucleic acids are NOT DETECTED.  The  SARS-CoV-2 RNA is generally detectable in upper and lower respiratory specimens during the acute phase of infection. Negative results do not preclude SARS-CoV-2 infection, do not rule out co-infections with other pathogens, and should not be used as the sole basis for treatment or other patient management decisions. Negative results must be combined with clinical observations, patient history, and epidemiological information. The expected result is Negative.  Fact Sheet for Patients: HairSlick.no  Fact Sheet for Healthcare Providers: quierodirigir.com  This test is not yet approved or cleared by the [place] FDA and  has been authorized for detection and/or diagnosis of SARS-CoV-2 by FDA under an Emergency Use Authorization (EUA). This EUA will remain  in effect (meaning this test can be used) for the duration of the COVID-19 declaration under Se ction 564(b)(1) of the Act, 21 U.S.C. section 360bbb-3(b)(1), unless the authorization is terminated or revoked sooner.  Performed at [facility], [address] [place], [place] [address]   C Difficile Quick Screen w PCR reflex     Status: None   Collection Time: 07/31/21  4:15 PM   Specimen: STOOL  Result Value Ref Range Status   C Diff antigen NEGATIVE NEGATIVE Final   C Diff toxin NEGATIVE NEGATIVE Final   C Diff interpretation No C. difficile detected.  Final    Comment: Performed at [facility], [address], [place], [place] [address]     Time coordinating discharge:  I have spent 35 minutes face to face with the patient and on the ward discussing the patients care, assessment, plan and disposition with  other care givers. >50% of the time was devoted counseling the patient about the risks and benefits of treatment/Discharge disposition and coordinating care.   SIGNED:   Dimple Nanas, MD  [facility] 08/01/2021, 1:25 PM   If 7PM-7AM, please contact night-coverage

## 2021-08-01 NOTE — TOC Transition Note (Signed)
Transition of Care [facility]) - CM/SW Discharge Note   Patient Details  Name: Catherine Harrell MRN: [id number] Date of Birth: [birth year]  Transition of Care [facility]) CM/SW Contact:  Karn Cassis, LCSW Phone Number: 08/01/2021, 11:50 AM   Clinical Narrative:  Pt d/c today and will return home with home health PT. Clifton Custard with [facility] notified of d/c. Orders in. Pt's daughter, Tammy aware of d/c and indicates family will pick her up.      Final next level of care: Home w Home Health Services Barriers to Discharge: Barriers Resolved   Patient Goals and CMS Choice Patient states their goals for this hospitalization and ongoing recovery are:: to go home. CMS Medicare.gov Compare Post Acute Care list provided to:: Patient Represenative (must comment) Choice offered to / list presented to : Adult Children  Discharge Placement                  Name of family member notified: Tammy- daughter Patient and family notified of of transfer: 08/01/21  Discharge Plan and Services     Post Acute Care Choice: Home Health          DME Arranged: N/A         HH Arranged: PT HH Agency: [facility] Date HH Agency Contacted: 08/01/21 Time HH Agency Contacted: 1150 Representative spoke with at [facility] Agency: Clifton Custard  Social Determinants of Health (SDOH) Interventions     Readmission Risk Interventions Readmission Risk Prevention Plan 07/31/2021  Medication Screening Complete  Transportation Screening Complete  Some recent data might be hidden

## 2021-08-03 ENCOUNTER — Other Ambulatory Visit: Payer: Medicare HMO

## 2021-08-05 ENCOUNTER — Telehealth: Payer: Self-pay

## 2021-08-05 NOTE — Telephone Encounter (Signed)
Transition Care Management Unsuccessful Follow-up Telephone Call  Date of discharge and from where:  Catherine Harrell 08/01/21  Diagnosis:  COPD exacerbation, diarrhea   Attempts:  1st Attempt  Reason for unsuccessful TCM follow-up call:  Left voice message on pt primary #   Transition Care Management Unsuccessful Follow-up Telephone Call  Date of discharge and from where:  Catherine Harrell 08/01/21  Diagnosis:  diarrhea, COPD exacerbation   Attempts:  2nd Attempt  Reason for unsuccessful TCM follow-up call:  Left voice message on patient secondary number

## 2021-08-10 DIAGNOSIS — J449 Chronic obstructive pulmonary disease, unspecified: Secondary | ICD-10-CM | POA: Diagnosis not present

## 2021-08-13 ENCOUNTER — Telehealth: Payer: Self-pay | Admitting: Family Medicine

## 2021-08-13 NOTE — Telephone Encounter (Signed)
Transition Care Management Unsuccessful Follow-up Telephone Call  Date of discharge and from where:  08/01/21 Catherine Harrell  Diagnosis:  copd exacerbation   Attempts:  3rd Attempt  Reason for unsuccessful TCM follow-up call:  Unable to reach patient

## 2021-08-13 NOTE — Telephone Encounter (Signed)
I called and spoke with Tammy, after Mickel Fuchs for an update: he is still awaiting prior approval from [facility] and said he would talk with Tammy. I gave her his office number [phone] -- she just wanted that one and not his cell). She said she has spoken with Gerre Pebbles before and will just give it more time, possibly calling the insurance company back, herself.

## 2021-08-13 NOTE — Telephone Encounter (Signed)
Patient's daughter Babette Relic called asked if she can get a number to contact [place] concerning the back brace for her mother? Tammy said she called the insurance company and was told a request for the back brace was not submitted to them. The number to contact Tammy is [phone]

## 2021-08-20 ENCOUNTER — Telehealth: Payer: Self-pay | Admitting: Family Medicine

## 2021-08-20 NOTE — Telephone Encounter (Signed)
Left message for patient to call back and schedule Medicare Annual Wellness Visit (AWV)   Last AWV 02/02/2019 please schedule at anytime with health coach  This should be a 45 minute visit.

## 2021-08-21 ENCOUNTER — Ambulatory Visit: Payer: Medicare HMO | Admitting: Urology

## 2021-08-23 ENCOUNTER — Telehealth: Payer: Self-pay | Admitting: Family Medicine

## 2021-08-23 NOTE — Telephone Encounter (Signed)
Pt called to set up a hospital follow up. Please  call back to set up

## 2021-08-23 NOTE — Telephone Encounter (Signed)
RETURNED CALL, NO ANSWER 

## 2021-09-09 DIAGNOSIS — J449 Chronic obstructive pulmonary disease, unspecified: Secondary | ICD-10-CM | POA: Diagnosis not present

## 2021-09-12 ENCOUNTER — Ambulatory Visit: Payer: Medicare HMO | Admitting: Family Medicine

## 2021-09-12 ENCOUNTER — Other Ambulatory Visit: Payer: Self-pay | Admitting: Family Medicine

## 2021-09-12 DIAGNOSIS — J411 Mucopurulent chronic bronchitis: Secondary | ICD-10-CM

## 2021-09-12 MED ORDER — PREDNISONE 5 MG PO TABS: 15.0000 mg | ORAL_TABLET | Freq: Every day | ORAL | 0 refills | Status: DC

## 2021-09-12 NOTE — Telephone Encounter (Signed)
  Prescription Request  09/12/2021  Is this a "Controlled Substance" medicine? NO Have you seen your PCP in the last 2 weeks? NO If YES, route message to pool  -  If NO, patient needs to be scheduled for appointment.  What is the name of the medication or equipment? Prednisone 5 mg Patient has appt 10-26 and needs medication  Have you contacted your pharmacy to request a refill? YES  Which pharmacy would you like this sent to? [facility] in [place]   Patient notified that their request is being sent to the clinical staff for review and that they should receive a response within 2 business days.

## 2021-09-18 ENCOUNTER — Encounter: Payer: Self-pay | Admitting: Family Medicine

## 2021-09-18 ENCOUNTER — Other Ambulatory Visit: Payer: Self-pay

## 2021-09-18 ENCOUNTER — Ambulatory Visit (INDEPENDENT_AMBULATORY_CARE_PROVIDER_SITE_OTHER): Payer: Medicare HMO | Admitting: Family Medicine

## 2021-09-18 VITALS — BP 140/79 | HR 108 | Temp 96.8°F | Ht 65.0 in | Wt 120.8 lb

## 2021-09-18 DIAGNOSIS — J449 Chronic obstructive pulmonary disease, unspecified: Secondary | ICD-10-CM | POA: Diagnosis not present

## 2021-09-18 DIAGNOSIS — H9113 Presbycusis, bilateral: Secondary | ICD-10-CM | POA: Diagnosis not present

## 2021-09-18 DIAGNOSIS — Z23 Encounter for immunization: Secondary | ICD-10-CM | POA: Diagnosis not present

## 2021-09-18 DIAGNOSIS — J9611 Chronic respiratory failure with hypoxia: Secondary | ICD-10-CM

## 2021-09-18 DIAGNOSIS — H9193 Unspecified hearing loss, bilateral: Secondary | ICD-10-CM | POA: Diagnosis not present

## 2021-09-18 NOTE — Progress Notes (Signed)
++-   Subjective:  Patient ID: Catherine Harrell, female    DOB: 15-Oct-1946  Age: 75 y.o. MRN: [id number]  CC: Hospitalization Follow-up   HPI Catherine Harrell presents for follow-up of a recent hospitalization.  She was last hospitalized September 5 through 8 of this year.  She had intractable diarrhea on admission.  This caused decompensation of her end-stage COPD as well.  She improved enough to go home on her own by the end of her stay.  Currently she says that she was short of breath some this morning.  However that stabilized.  She is now 97% on 2 L and feels comfortable.  She is mobile in her home.  She has been approved for a small pocket with size portable concentrator that would help her be more mobile.  Given the backpack concentrator is too heavy and is hard on her shoulders.  Of note is that she has had multiple compression fractures in the spine.  She is having pain at the mid axillary line bilaterally radiating from the lower thoracic region.  She does not want to take the Norco because she is concerned about getting addicted to it.  Of note is that although she has been approved for this small concentrator she has been waiting for it for several months apparently and has been approved but [facility] says they are too short handed to bring it to her.  She is no longer having diarrhea  Depression screen [facility] 2/9 09/18/2021 07/25/2021 07/08/2021  Decreased Interest 0 0 0  Down, Depressed, Hopeless 0 0 0  PHQ - 2 Score 0 0 0  Altered sleeping 0 0 -  Tired, decreased energy 3 0 -  Change in appetite 0 0 -  Feeling bad or failure about yourself  2 0 -  Trouble concentrating 0 0 -  Moving slowly or fidgety/restless 0 0 -  Suicidal thoughts 0 0 -  PHQ-9 Score 5 0 -  Difficult doing work/chores Not difficult at all Not difficult at all -  Some recent data might be hidden    History Catherine Harrell has a past medical history of COPD (chronic obstructive pulmonary disease) ([place]), Hypertension,  Osteoporosis, and TMJ arthralgia.   She has no past surgical history on file.   Her family history includes COPD in her father and sister; Cancer in her daughter and son; Diabetes in her mother and sister; Healthy in her son and son; Heart attack in her sister; Heart disease in her brother, father, and sister; Hypertension in her mother; Kidney disease in her daughter.She reports that she quit smoking about 16 years ago. Her smoking use included cigarettes. She has a 30.00 pack-year smoking history. She has never used smokeless tobacco. She reports that she does not drink alcohol and does not use drugs.    ROS Review of Systems  Constitutional: Negative.   HENT:  Positive for hearing loss (she is essentially deaf.).   Eyes:  Negative for visual disturbance.  Respiratory:  Positive for shortness of breath (at baseline).   Cardiovascular:  Negative for chest pain.  Gastrointestinal:  Negative for abdominal pain and blood in stool.       Stool heme positive at hospital in early september  Musculoskeletal:  Positive for arthralgias (shoulders, chronic) and back pain.   Objective:  BP 140/79   Pulse (!) 108   Temp (!) 96.8 F (36 C)   Ht 5' 5"  (1.651 m)   Wt 120 lb 12.8 oz (54.8 kg)  SpO2 97%   BMI 20.10 kg/m   BP Readings from Last 3 Encounters:  09/18/21 140/79  08/01/21 (!) 149/102  07/25/21 (!) 144/89    Wt Readings from Last 3 Encounters:  09/18/21 120 lb 12.8 oz (54.8 kg)  07/29/21 125 lb (56.7 kg)  07/25/21 121 lb (54.9 kg)     Physical Exam Constitutional:      General: She is not in acute distress.    Appearance: She is well-developed.  HENT:     Head: Normocephalic and atraumatic.     Nose: Nose normal.     Mouth/Throat:     Mouth: Mucous membranes are moist.  Cardiovascular:     Rate and Rhythm: Normal rate and regular rhythm.  Pulmonary:     Breath sounds: Normal breath sounds.     Comments: Diminished exchange without rhonchi currently. She is wearing  her O2 cannula at 2 liters at exam. Rolling a portable cannister.  Musculoskeletal:        General: Normal range of motion.  Skin:    General: Skin is warm and dry.  Neurological:     Mental Status: She is alert and oriented to person, place, and time.     Cranial Nerves: Cranial nerve deficit (She is deaf) present.  Psychiatric:        Mood and Affect: Mood normal.        Behavior: Behavior normal.      Assessment & Plan:   Anacristina was seen today for hospitalization follow-up.  Diagnoses and all orders for this visit:  Chronic respiratory failure with hypoxia ([place]) -     CBC with Differential/Platelet -     CMP14+EGFR  End stage COPD ([place]) -     CBC with Differential/Platelet -     CMP14+EGFR  Presbycusis of both ears -     CBC with Differential/Platelet -     CMP14+EGFR  Bilateral deafness      I am having Nahjae Shimp maintain her cholecalciferol, Calcium 1200, acetaminophen, Spiriva Respimat, losartan, ipratropium-albuterol, gabapentin, and predniSONE.  Allergies as of 09/18/2021       Reactions   Levofloxacin         Medication List        Accurate as of September 18, 2021  2:51 PM. If you have any questions, ask your nurse or doctor.          acetaminophen 325 MG tablet Commonly known as: TYLENOL Take 650 mg by mouth every 6 (six) hours as needed.   Calcium 1200 1200-1000 MG-UNIT Chew Chew 1 tablet by mouth daily.   cholecalciferol 25 MCG (1000 UNIT) tablet Commonly known as: VITAMIN D3 Take 1,000 Units by mouth daily.   gabapentin 300 MG capsule Commonly known as: NEURONTIN Take 3 capsules (900 mg total) by mouth at bedtime.   ipratropium-albuterol 0.5-2.5 (3) MG/3ML Soln Commonly known as: DUONEB Take 3 mLs by nebulization every 4 (four) hours as needed. INHALE CONTENTS OF 1 VIAL VIA NEBULIZER EVERY 4 HOURS AS NEEDED   losartan 25 MG tablet Commonly known as: COZAAR Take 1 tablet (25 mg total) by mouth daily.   predniSONE 5 MG  tablet Commonly known as: DELTASONE Take 3 tablets (15 mg total) by mouth daily with breakfast.   Spiriva Respimat 2.5 MCG/ACT Aers Generic drug: Tiotropium Bromide Monohydrate Inhale 2 puffs into the lungs daily.         Follow-up: Return in about 3 months (around 12/19/2021), or if symptoms worsen or fail  to improve, for COPD.  Claretta Fraise, M.D.

## 2021-09-19 LAB — CBC WITH DIFFERENTIAL/PLATELET
Basophils Absolute: 0 10*3/uL (ref 0.0–0.2)
Basos: 0 %
EOS (ABSOLUTE): 0 10*3/uL (ref 0.0–0.4)
Eos: 0 %
Hematocrit: 40 % (ref 34.0–46.6)
Hemoglobin: 13.2 g/dL (ref 11.1–15.9)
Immature Grans (Abs): 0.1 10*3/uL (ref 0.0–0.1)
Immature Granulocytes: 1 %
Lymphocytes Absolute: 0.6 10*3/uL — ABNORMAL LOW (ref 0.7–3.1)
Lymphs: 5 %
MCH: 29.5 pg (ref 26.6–33.0)
MCHC: 33 g/dL (ref 31.5–35.7)
MCV: 90 fL (ref 79–97)
Monocytes Absolute: 0.3 10*3/uL (ref 0.1–0.9)
Monocytes: 3 %
Neutrophils Absolute: 9.6 10*3/uL — ABNORMAL HIGH (ref 1.4–7.0)
Neutrophils: 91 %
Platelets: 408 10*3/uL (ref 150–450)
RBC: 4.47 x10E6/uL (ref 3.77–5.28)
RDW: 12 % (ref 11.7–15.4)
WBC: 10.6 10*3/uL (ref 3.4–10.8)

## 2021-09-19 LAB — CMP14+EGFR
ALT: 9 IU/L (ref 0–32)
AST: 12 IU/L (ref 0–40)
Albumin/Globulin Ratio: 1.9 (ref 1.2–2.2)
Albumin: 4.1 g/dL (ref 3.7–4.7)
Alkaline Phosphatase: 81 IU/L (ref 44–121)
BUN/Creatinine Ratio: 13 (ref 12–28)
BUN: 11 mg/dL (ref 8–27)
Bilirubin Total: <0.2 mg/dL (ref 0.0–1.2)
CO2: 26 mmol/L (ref 20–29)
Calcium: 10.4 mg/dL — ABNORMAL HIGH (ref 8.7–10.3)
Chloride: 93 mmol/L — ABNORMAL LOW (ref 96–106)
Creatinine, Ser: 0.88 mg/dL (ref 0.57–1.00)
Globulin, Total: 2.2 g/dL (ref 1.5–4.5)
Glucose: 133 mg/dL — ABNORMAL HIGH (ref 70–99)
Potassium: 5.1 mmol/L (ref 3.5–5.2)
Sodium: 137 mmol/L (ref 134–144)
Total Protein: 6.3 g/dL (ref 6.0–8.5)
eGFR: 68 mL/min/{1.73_m2} (ref >59)

## 2021-09-21 NOTE — Progress Notes (Signed)
Hello Catherine Harrell,  Your lab result is normal and/or stable.Some minor variations that are not significant are commonly marked abnormal, but do not represent any medical problem for you.  Best regards, [name], M.D.

## 2021-09-23 DIAGNOSIS — M546 Pain in thoracic spine: Secondary | ICD-10-CM | POA: Diagnosis not present

## 2021-10-11 ENCOUNTER — Other Ambulatory Visit: Payer: Self-pay | Admitting: Family Medicine

## 2021-10-11 DIAGNOSIS — J411 Mucopurulent chronic bronchitis: Secondary | ICD-10-CM

## 2021-10-14 ENCOUNTER — Telehealth: Payer: Self-pay | Admitting: Family Medicine

## 2021-10-14 NOTE — Telephone Encounter (Signed)
Please let the patient know that I sent their prescription to their pharmacy. Thanks, WS 

## 2021-10-15 ENCOUNTER — Ambulatory Visit (INDEPENDENT_AMBULATORY_CARE_PROVIDER_SITE_OTHER): Payer: Medicare HMO | Admitting: Family Medicine

## 2021-10-15 ENCOUNTER — Other Ambulatory Visit: Payer: Self-pay

## 2021-10-15 ENCOUNTER — Encounter: Payer: Self-pay | Admitting: Family Medicine

## 2021-10-15 VITALS — BP 139/78 | HR 118 | Temp 97.9°F | Ht 65.0 in | Wt 123.2 lb

## 2021-10-15 DIAGNOSIS — I10 Essential (primary) hypertension: Secondary | ICD-10-CM | POA: Diagnosis not present

## 2021-10-15 DIAGNOSIS — J449 Chronic obstructive pulmonary disease, unspecified: Secondary | ICD-10-CM | POA: Diagnosis not present

## 2021-10-15 DIAGNOSIS — J9611 Chronic respiratory failure with hypoxia: Secondary | ICD-10-CM

## 2021-10-15 DIAGNOSIS — M546 Pain in thoracic spine: Secondary | ICD-10-CM

## 2021-10-15 DIAGNOSIS — G8929 Other chronic pain: Secondary | ICD-10-CM | POA: Diagnosis not present

## 2021-10-15 DIAGNOSIS — J411 Mucopurulent chronic bronchitis: Secondary | ICD-10-CM

## 2021-10-15 MED ORDER — PREDNISONE 5 MG PO TABS: 15.0000 mg | ORAL_TABLET | Freq: Every day | ORAL | 5 refills | Status: AC

## 2021-10-15 MED ORDER — LOSARTAN POTASSIUM 25 MG PO TABS: 25.0000 mg | ORAL_TABLET | Freq: Every day | ORAL | 3 refills | Status: AC

## 2021-10-15 MED ORDER — SPIRIVA RESPIMAT 2.5 MCG/ACT IN AERS: 2.0000 | INHALATION_SPRAY | Freq: Every day | RESPIRATORY_TRACT | 11 refills | Status: DC

## 2021-10-15 MED ORDER — TRAMADOL HCL 50 MG PO TABS: 50.0000 mg | ORAL_TABLET | Freq: Four times a day (QID) | ORAL | 2 refills | Status: AC | PRN

## 2021-10-15 NOTE — Progress Notes (Signed)
Subjective:  Patient ID: Catherine Harrell, female    DOB: 20-Mar-1946  Age: 75 y.o. MRN: [id number]  CC: Medication Refill   HPI Catherine Harrell presents for persistent shortness of breath and severe back pain.  She was recently delivered a portable oxygen concentrator for her to be able to move about more easily.  However, she states she does not know how to set it up so she is decided to send it back.  She wants to just use the small tanks that she can drag behind her.  This is simpler for her.  The back pain is 8/10 frequently.  It is not affected by activities overall.  It is intermittent.  It is there most of the time.  She could not tolerate the morphine.  She said it made her nauseous and she developed intractable vomiting.  She cannot use that anymore.  She had tramadol prescribed about a year ago.  She does not recall having any problem with that.  She is willing to try at this time.  Due to her COPD she is dependent on prednisone.  She is out at this time.  She needs refills.  She is significantly more short of breath when she does not use it daily.  Depression screen [facility] 2/9 09/18/2021 07/25/2021 07/08/2021  Decreased Interest 0 0 0  Down, Depressed, Hopeless 0 0 0  PHQ - 2 Score 0 0 0  Altered sleeping 0 0 -  Tired, decreased energy 3 0 -  Change in appetite 0 0 -  Feeling bad or failure about yourself  2 0 -  Trouble concentrating 0 0 -  Moving slowly or fidgety/restless 0 0 -  Suicidal thoughts 0 0 -  PHQ-9 Score 5 0 -  Difficult doing work/chores Not difficult at all Not difficult at all -  Some recent data might be hidden    History Mellody has a past medical history of COPD (chronic obstructive pulmonary disease) ([place]), Hypertension, Osteoporosis, and TMJ arthralgia.   She has no past surgical history on file.   Her family history includes COPD in her father and sister; Cancer in her daughter and son; Diabetes in her mother and sister; Healthy in her son and son; Heart  attack in her sister; Heart disease in her brother, father, and sister; Hypertension in her mother; Kidney disease in her daughter.She reports that she quit smoking about 16 years ago. Her smoking use included cigarettes. She has a 30.00 pack-year smoking history. She has never used smokeless tobacco. She reports that she does not drink alcohol and does not use drugs.    ROS Review of Systems  Constitutional: Negative.   HENT: Negative.    Eyes:  Negative for visual disturbance.  Respiratory:  Positive for shortness of breath.   Cardiovascular:  Positive for palpitations (frequent, after use of nebulizer). Negative for chest pain.  Gastrointestinal:  Negative for abdominal pain.  Musculoskeletal:  Positive for arthralgias and back pain.   Objective:  BP 139/78   Pulse (!) 118   Temp 97.9 F (36.6 C)   Ht 5\' 5"  (1.651 m)   Wt 123 lb 3.2 oz (55.9 kg)   SpO2 97%   BMI 20.50 kg/m   BP Readings from Last 3 Encounters:  10/15/21 139/78  09/18/21 140/79  08/01/21 (!) 149/102    Wt Readings from Last 3 Encounters:  10/15/21 123 lb 3.2 oz (55.9 kg)  09/18/21 120 lb 12.8 oz (54.8 kg)  07/29/21 125 lb (56.7  kg)     Physical Exam Constitutional:      General: She is not in acute distress.    Appearance: She is well-developed.  Cardiovascular:     Rate and Rhythm: Normal rate. Rhythm irregular.  Pulmonary:     Effort: No respiratory distress.     Breath sounds: Wheezing and rhonchi present.     Comments: Very distant sounds Abdominal:     General: Abdomen is flat.  Musculoskeletal:        General: Normal range of motion.  Skin:    General: Skin is warm and dry.  Neurological:     Mental Status: She is alert and oriented to person, place, and time.      Assessment & Plan:   Bob was seen today for medication refill.  Diagnoses and all orders for this visit:  Chronic respiratory failure with hypoxia (HCC)  Chronic obstructive pulmonary disease, unspecified COPD  type (HCC) -     Tiotropium Bromide Monohydrate (SPIRIVA RESPIMAT) 2.5 MCG/ACT AERS; Inhale 2 puffs into the lungs daily. -     predniSONE (DELTASONE) 5 MG tablet; Take 3 tablets (15 mg total) by mouth daily with breakfast. J44.9 -     For home use only DME oxygen  Mucopurulent chronic bronchitis (HCC) -     predniSONE (DELTASONE) 5 MG tablet; Take 3 tablets (15 mg total) by mouth daily with breakfast. J44.9 -     For home use only DME oxygen  End stage COPD (HCC)  Essential hypertension -     losartan (COZAAR) 25 MG tablet; Take 1 tablet (25 mg total) by mouth daily.  Chronic bilateral thoracic back pain -     traMADol (ULTRAM) 50 MG tablet; Take 1 tablet (50 mg total) by mouth 4 (four) times daily as needed for up to 5 days for moderate pain.      I have changed Benicia Chaney's predniSONE. I am also having her start on traMADol. Additionally, I am having her maintain her cholecalciferol, Calcium 1200, acetaminophen, ipratropium-albuterol, gabapentin, losartan, and Spiriva Respimat.  Allergies as of 10/15/2021       Reactions   Levofloxacin         Medication List        Accurate as of October 15, 2021  5:30 PM. If you have any questions, ask your nurse or doctor.          acetaminophen 325 MG tablet Commonly known as: TYLENOL Take 650 mg by mouth every 6 (six) hours as needed.   Calcium 1200 1200-1000 MG-UNIT Chew Chew 1 tablet by mouth daily.   cholecalciferol 25 MCG (1000 UNIT) tablet Commonly known as: VITAMIN D3 Take 1,000 Units by mouth daily.   gabapentin 300 MG capsule Commonly known as: NEURONTIN Take 3 capsules (900 mg total) by mouth at bedtime.   ipratropium-albuterol 0.5-2.5 (3) MG/3ML Soln Commonly known as: DUONEB Take 3 mLs by nebulization every 4 (four) hours as needed. INHALE CONTENTS OF 1 VIAL VIA NEBULIZER EVERY 4 HOURS AS NEEDED   losartan 25 MG tablet Commonly known as: COZAAR Take 1 tablet (25 mg total) by mouth daily.    predniSONE 5 MG tablet Commonly known as: DELTASONE Take 3 tablets (15 mg total) by mouth daily with breakfast. J44.9 What changed: additional instructions Changed by: Claretta Fraise, MD   Spiriva Respimat 2.5 MCG/ACT Aers Generic drug: Tiotropium Bromide Monohydrate Inhale 2 puffs into the lungs daily.   traMADol 50 MG tablet Commonly known as: ULTRAM Take  1 tablet (50 mg total) by mouth 4 (four) times daily as needed for up to 5 days for moderate pain. Started by: [name], MD               Durable Medical Equipment  (From admission, onward)           Start     Ordered   10/15/21 0000  For home use only DME oxygen       Comments: Needs 1/2 size tanks  Question Answer Comment  Length of Need Lifetime   Mode or (Route) Nasal cannula   Frequency Continuous (stationary and portable oxygen unit needed)   Oxygen conserving device No   Oxygen delivery system Gas      10/15/21 1659             Follow-up: Return in about 3 months (around 01/15/2022), or if symptoms worsen or fail to improve.  [name], M.D.

## 2021-10-15 NOTE — Telephone Encounter (Signed)
Lm rx has been sent

## 2021-10-24 NOTE — Telephone Encounter (Signed)
Pt said her Oxygen needs to go to [facility]. She talked to them this morning and they had not received it. Please call back when it is sent.

## 2021-10-24 NOTE — Telephone Encounter (Signed)
Done

## 2021-10-24 NOTE — Telephone Encounter (Signed)
Please reprint my order from 9 days ago and refax.

## 2021-12-17 ENCOUNTER — Telehealth: Payer: Self-pay | Admitting: Orthopedic Surgery

## 2021-12-17 NOTE — Telephone Encounter (Signed)
Pt is already scheduled

## 2021-12-17 NOTE — Telephone Encounter (Signed)
Pts daughter Catherine Harrell called stating the pt was referred for an MRI about 5 months ago but due to a hectic schedule she never got it. The pt is ready to get it now and was told it should be no problem to get her scheduled with the last MRI referral that was on file. Pt wants to make sure her insurance will still cover it even though it's been like 5 months since she was originally referred and would like a CB to confirm.   [phone]

## 2021-12-21 ENCOUNTER — Other Ambulatory Visit: Payer: Self-pay | Admitting: Nurse Practitioner

## 2021-12-30 ENCOUNTER — Ambulatory Visit
Admission: RE | Admit: 2021-12-30 | Discharge: 2021-12-30 | Disposition: A | Discharge status: Home / Self Care | Payer: Medicare HMO | Source: Ambulatory Visit | Attending: Orthopedic Surgery | Admitting: Orthopedic Surgery

## 2021-12-30 DIAGNOSIS — M546 Pain in thoracic spine: Secondary | ICD-10-CM

## 2021-12-30 DIAGNOSIS — R200 Anesthesia of skin: Secondary | ICD-10-CM | POA: Diagnosis not present

## 2021-12-31 NOTE — Progress Notes (Signed)
I have never seen this person

## 2021-12-31 NOTE — Progress Notes (Signed)
So is he coming in?  I really do not have anything to offer him but can see him to tell him same.  May need surgical referral if his pain is significant

## 2022-01-01 NOTE — Progress Notes (Signed)
I have called and LM for patient to call me.  Wanted to make sure patient is getting help for issue in her back. Dr August Saucer is happy to see her about this if needed but not sure if someone else is already following up on this for her.

## 2022-01-13 ENCOUNTER — Telehealth: Payer: Self-pay | Admitting: Family Medicine

## 2022-01-13 NOTE — Telephone Encounter (Signed)
Copied from CRM [id number]. Topic: Medicare AWV >> Jan 13, 2022 12:19 PM Claudette Laws R wrote: Reason for CRM:  Left message for patient to call back and schedule Medicare Annual Wellness Visit (AWV) to be completed by video or phone.   Last AWV:  02/02/2019  Please schedule at anytime with [facility] Nurse Health Advisor   Germaine Pomfret  45 minute appointment  Any questions, please contact me at [phone]

## 2022-01-17 ENCOUNTER — Institutional Professional Consult (permissible substitution): Payer: Self-pay | Admitting: Internal Medicine

## 2022-01-22 ENCOUNTER — Other Ambulatory Visit: Payer: Self-pay

## 2022-01-22 ENCOUNTER — Ambulatory Visit: Payer: Medicare HMO | Admitting: Orthopedic Surgery

## 2022-01-22 DIAGNOSIS — M546 Pain in thoracic spine: Secondary | ICD-10-CM | POA: Diagnosis not present

## 2022-01-22 MED ORDER — TRAMADOL HCL 50 MG PO TABS: 50.0000 mg | ORAL_TABLET | Freq: Two times a day (BID) | ORAL | 0 refills | Status: AC | PRN

## 2022-01-23 ENCOUNTER — Telehealth: Payer: Self-pay | Admitting: Orthopedic Surgery

## 2022-01-23 NOTE — Telephone Encounter (Signed)
Pt's daughter Lynelle Smoke called requesting a call back from [employer]. She states she has medication questions. Phone number is [phone]. ?

## 2022-01-23 NOTE — Telephone Encounter (Signed)
What I told them is usually people with morphine allergies are also allergic to oxycodone.  I can do one-time prescription for that if they would like and if she has not been allergic to it but in general its not really indicated for older patients because it makes them fallen there is a whole host of problems with that medication.  If she needs more after that she will have to talk to her primary care provider can we send some in tomorrow.  Thanks

## 2022-01-24 ENCOUNTER — Encounter: Payer: Self-pay | Admitting: Orthopedic Surgery

## 2022-01-24 NOTE — Progress Notes (Signed)
? ?Office Visit Note ?  ?Patient: Catherine Harrell           ?Date of Birth: 08-Dec-1945           ?MRN: [id number] ?Visit Date: 01/22/2022 ?Requested by: Claretta Fraise, MD ?[address] [place],  [place] [address] ?PCP: Claretta Fraise, MD ? ?Subjective: ?Chief Complaint  ?Patient presents with  ? Other  ?   ?Scan review  ? ? ?HPI: Patient presents for follow-up of MRI thoracic spine.  She describes multiple falls last year.  She has multiple chronic thoracic compression fractures.  Scan is reviewed with the patient and her family.  The T7 fracture appears to be acute.  She does have COPD and is on home O2.  Back brace is not useful.  Takes over-the-counter pain medication without much relief.  The acute pain in her thoracic spine is about 28 weeks old. ?             ?ROS: All systems reviewed are negative as they relate to the chief complaint within the history of present illness.  Patient denies  fevers or chills. ? ? ?Assessment & Plan: ?Visit Diagnoses:  ?1. Pain in thoracic spine   ? ? ?Plan: Impression is chronic thoracic compression fractures in the setting of kyphosis with acute fracture at T7.  I think this is what is giving her the pain she is having.  Has been going on now for about 3 to 4 weeks without much improvement.  Plan is Ultram prescription sent in and would like to refer to neurosurgery for T7 vertebroplasty consideration.  Follow-up with [place] as needed. ? ?Follow-Up Instructions: No follow-ups on file.  ? ?Orders:  ?Orders Placed This Encounter  ?Procedures  ? Ambulatory referral to Neurosurgery  ? ?Meds ordered this encounter  ?Medications  ? traMADol (ULTRAM) 50 MG tablet  ?  Sig: Take 1 tablet (50 mg total) by mouth every 12 (twelve) hours as needed.  ?  Dispense:  30 tablet  ?  Refill:  0  ? ? ? ? Procedures: ?No procedures performed ? ? ?Clinical Data: ?No additional findings. ? ?Objective: ?Vital Signs: There were no vitals taken for this visit. ? ?Physical Exam:  ? ?Constitutional: Patient appears  well-developed ?HEENT:  ?Head: Normocephalic ?Eyes:EOM are normal ?Neck: Normal range of motion ?Cardiovascular: Normal rate ?Pulmonary/chest: Effort normal ?Neurologic: Patient is alert ?Skin: Skin is warm ?Psychiatric: Patient has normal mood and affect ? ? ?Ortho Exam: Ortho exam demonstrates negative clonus negative Babinski in the lower extremities.  Reflexes about 1+ out of 4 bilateral patella and Achilles.  No definite paresthesias L5-S1.  Patient has good ankle dorsiflexion plantarflexion quad and hamstring strength.  Does localize tenderness in the midthoracic area to direct palpation.  Kyphosis is present. ? ?Specialty Comments:  ?No specialty comments available. ? ?Imaging: ?No results found. ? ? ?PMFS History: ?Patient Active Problem List  ? Diagnosis Date Noted  ? Chronic respiratory failure with hypoxia ([place]) 07/30/2021  ? Diarrhea 07/29/2021  ? Presbycusis of both ears 12/06/2019  ? Osteoporosis 06/09/2017  ? Essential hypertension 08/07/2014  ? End stage COPD ([place]) 01/16/2014  ? ?Past Medical History:  ?Diagnosis Date  ? COPD (chronic obstructive pulmonary disease) ([place])   ? Hypertension   ? Osteoporosis   ? TMJ arthralgia   ? left  ?  ?Family History  ?Problem Relation Age of Onset  ? Heart disease Father   ? COPD Father   ? Heart disease Sister   ?  Heart attack Sister   ? Diabetes Sister   ? COPD Sister   ? Heart disease Brother   ? Cancer Daughter   ?     breast  ? Hypertension Mother   ? Diabetes Mother   ? Healthy Son   ? Cancer Son   ?     Prostate   ? Kidney disease Daughter   ? Healthy Son   ?  ?No past surgical history on file. ?Social History  ? ?Occupational History  ? Occupation: Retired  ?Tobacco Use  ? Smoking status: Former  ?  Packs/day: 1.00  ?  Years: 30.00  ?  Pack years: 30.00  ?  Types: Cigarettes  ?  Quit date: 01/16/2005  ?  Years since quitting: 17.0  ? Smokeless tobacco: Never  ?Vaping Use  ? Vaping Use: Never used  ?Substance and Sexual Activity  ? Alcohol use: No  ? Drug  use: No  ? Sexual activity: Not Currently  ? ? ? ? ? ?

## 2022-01-24 NOTE — Telephone Encounter (Signed)
Tried calling-no answer. LMVM advising per Dr Alfonso Patten note-asked her to please call us back and let us know how they wish to proceed.  ?

## 2022-01-30 ENCOUNTER — Telehealth: Payer: Self-pay | Admitting: Family Medicine

## 2022-01-30 NOTE — Telephone Encounter (Signed)
Left message for patient to call back and schedule Medicare Annual Wellness Visit (AWV) to be completed by video or phone.  ? ?Last AWV: 02/02/2019 ? ?Please schedule at anytime with [facility] Nurse Health Advisor   Germaine Pomfret ? ?45 minute appointment ? ?Any questions, please contact me at [phone]  ?  ?  ?

## 2022-02-19 DIAGNOSIS — M47814 Spondylosis without myelopathy or radiculopathy, thoracic region: Secondary | ICD-10-CM | POA: Diagnosis not present

## 2022-02-19 DIAGNOSIS — I10 Essential (primary) hypertension: Secondary | ICD-10-CM | POA: Diagnosis not present

## 2022-02-19 DIAGNOSIS — S22060A Wedge compression fracture of T7-T8 vertebra, initial encounter for closed fracture: Secondary | ICD-10-CM | POA: Diagnosis not present

## 2022-02-25 ENCOUNTER — Institutional Professional Consult (permissible substitution): Payer: Self-pay | Admitting: Internal Medicine

## 2022-03-03 ENCOUNTER — Other Ambulatory Visit: Payer: Self-pay | Admitting: Family Medicine

## 2022-03-05 ENCOUNTER — Ambulatory Visit (INDEPENDENT_AMBULATORY_CARE_PROVIDER_SITE_OTHER): Payer: Medicare HMO | Admitting: Family Medicine

## 2022-03-05 ENCOUNTER — Ambulatory Visit: Payer: Medicare HMO | Admitting: Family Medicine

## 2022-03-05 ENCOUNTER — Ambulatory Visit (INDEPENDENT_AMBULATORY_CARE_PROVIDER_SITE_OTHER): Payer: Medicare HMO

## 2022-03-05 ENCOUNTER — Encounter: Payer: Self-pay | Admitting: Family Medicine

## 2022-03-05 VITALS — BP 114/70 | HR 113 | Temp 98.2°F

## 2022-03-05 DIAGNOSIS — R0600 Dyspnea, unspecified: Secondary | ICD-10-CM | POA: Diagnosis not present

## 2022-03-05 DIAGNOSIS — R053 Chronic cough: Secondary | ICD-10-CM

## 2022-03-05 DIAGNOSIS — J449 Chronic obstructive pulmonary disease, unspecified: Secondary | ICD-10-CM | POA: Diagnosis not present

## 2022-03-05 DIAGNOSIS — J439 Emphysema, unspecified: Secondary | ICD-10-CM | POA: Diagnosis not present

## 2022-03-05 DIAGNOSIS — R059 Cough, unspecified: Secondary | ICD-10-CM | POA: Diagnosis not present

## 2022-03-05 DIAGNOSIS — Z13220 Encounter for screening for lipoid disorders: Secondary | ICD-10-CM | POA: Diagnosis not present

## 2022-03-05 DIAGNOSIS — J441 Chronic obstructive pulmonary disease with (acute) exacerbation: Secondary | ICD-10-CM | POA: Diagnosis not present

## 2022-03-05 DIAGNOSIS — I10 Essential (primary) hypertension: Secondary | ICD-10-CM | POA: Diagnosis not present

## 2022-03-05 MED ORDER — HYDROCODONE-ACETAMINOPHEN 5-325 MG PO TABS: 1.0000 | ORAL_TABLET | ORAL | 0 refills | Status: AC | PRN

## 2022-03-05 MED ORDER — BETAMETHASONE SOD PHOS & ACET 6 (3-3) MG/ML IJ SUSP
6.0000 mg | Freq: Once | INTRAMUSCULAR | Status: AC
  Administered 2022-03-05: 6 mg via INTRAMUSCULAR

## 2022-03-05 MED ORDER — CEFPROZIL 500 MG PO TABS: 500.0000 mg | ORAL_TABLET | Freq: Two times a day (BID) | ORAL | 0 refills | Status: AC

## 2022-03-05 NOTE — Progress Notes (Signed)
? ?Subjective:  ?Patient ID: Catherine Harrell, female    DOB: 10-Sep-1946  Age: 76 y.o. MRN: [id number] ? ?CC: Medical Management of Chronic Issues ? ? ?HPI ?Catherine Harrell presents for back pain from compressions. Seeing Neurosurg. Considering vertebroplasty. Pain is 10/10 in back. Daughter gives much of history. Says pt. Can't do anything. Considering going to a nursing home. They are concerned about pain from lungs. Pain prevents her from walking.  ? ? ? ?Cough has worsened from baseline for a couple of months. COncerned for pneumonia. More dyspneic in spite of O2 and meds.  ? ? ?  03/05/2022  ?  3:19 PM 03/05/2022  ?  3:05 PM 09/18/2021  ?  2:12 PM  ?Depression screen PHQ 2/9  ?Decreased Interest 2 0 0  ?Down, Depressed, Hopeless 2 0 0  ?PHQ - 2 Score 4 0 0  ?Altered sleeping 0  0  ?Tired, decreased energy 2  3  ?Change in appetite 0  0  ?Feeling bad or failure about yourself  1  2  ?Trouble concentrating 1  0  ?Moving slowly or fidgety/restless 0  0  ?Suicidal thoughts 0  0  ?PHQ-9 Score 8  5  ?Difficult doing work/chores Somewhat difficult  Not difficult at all  ? ? ?History ?Catherine Harrell has a past medical history of COPD (chronic obstructive pulmonary disease) ([place]), Hypertension, Osteoporosis, and TMJ arthralgia.  ? ?She has no past surgical history on file.  ? ?Her family history includes COPD in her father and sister; Cancer in her daughter and son; Diabetes in her mother and sister; Healthy in her son and son; Heart attack in her sister; Heart disease in her brother, father, and sister; Hypertension in her mother; Kidney disease in her daughter.She reports that she quit smoking about 17 years ago. Her smoking use included cigarettes. She has a 30.00 pack-year smoking history. She has never used smokeless tobacco. She reports that she does not drink alcohol and does not use drugs. ? ? ? ?ROS ?Review of Systems  ?Constitutional:  Positive for fatigue. Negative for fever.  ?HENT:  Positive for hearing loss (gtting  hearing aids next week.). Negative for congestion, rhinorrhea and sore throat.   ?Respiratory:  Positive for cough, chest tightness and shortness of breath.   ?Cardiovascular:  Negative for chest pain and palpitations.  ?Gastrointestinal:  Negative for abdominal pain.  ?Musculoskeletal:  Positive for arthralgias and back pain. Negative for myalgias.  ? ?Objective:  ?BP 114/70   Pulse (!) 113   Temp 98.2 ?F (36.8 ?C)   SpO2 95%  ? ?BP Readings from Last 3 Encounters:  ?03/05/22 114/70  ?10/15/21 139/78  ?09/18/21 140/79  ? ? ?Wt Readings from Last 3 Encounters:  ?10/15/21 123 lb 3.2 oz (55.9 kg)  ?09/18/21 120 lb 12.8 oz (54.8 kg)  ?07/29/21 125 lb (56.7 kg)  ? ? ? ?Physical Exam ?Constitutional:   ?   General: She is not in acute distress. ?   Appearance: She is well-developed.  ?Cardiovascular:  ?   Rate and Rhythm: Normal rate and regular rhythm.  ?Pulmonary:  ?   Breath sounds: Wheezing and rhonchi present. No rales.  ?Abdominal:  ?   General: Abdomen is flat.  ?   Palpations: Abdomen is soft.  ?Musculoskeletal:     ?   General: Normal range of motion.  ?Skin: ?   General: Skin is warm and dry.  ?Neurological:  ?   Mental Status: She is alert and oriented to person,  place, and time.  ? ? ?CXR: difuse insterstitial changes, COPD. Possible mass LUL ? ?Assessment & Plan:  ? ?Catherine Harrell was seen today for medical management of chronic issues. ? ?Diagnoses and all orders for this visit: ? ?Essential hypertension ?-     CMP14+EGFR ?-     CBC with Differential/Platelet ? ?Lipid screening ?-     Lipid panel ? ?End stage COPD ([place]) ?-     DG Chest 2 View; Future ?-     CT Chest Wo Contrast; Future ? ?Chronic cough ?-     DG Chest 2 View; Future ? ?Acute exacerbation of chronic obstructive pulmonary disease (COPD) (HCC) ?-     betamethasone acetate-betamethasone sodium phosphate (CELESTONE) injection 6 mg ?-     CT Chest Wo Contrast; Future ? ?Other orders ?-     cefPROZIL (CEFZIL) 500 MG tablet; Take 1 tablet (500 mg  total) by mouth 2 (two) times daily. For infection. Take all of this medication. ?-     HYDROcodone-acetaminophen (NORCO) 5-325 MG tablet; Take 1 tablet by mouth every 4 (four) hours as needed for moderate pain. Take for moderate to severe pain ? ? ? ? ? ? ?I am having Catherine Harrell start on cefPROZIL and HYDROcodone-acetaminophen. I am also having her maintain her cholecalciferol, Calcium 1200, acetaminophen, ipratropium-albuterol, losartan, Spiriva Respimat, predniSONE, traMADol, and gabapentin. We administered betamethasone acetate-betamethasone sodium phosphate. ? ?Allergies as of 03/05/2022   ? ?   Reactions  ? Levofloxacin   ? ?  ? ?  ?Medication List  ?  ? ?  ? Accurate as of March 05, 2022  5:19 PM. If you have any questions, ask your nurse or doctor.  ?  ?  ? ?  ? ?acetaminophen 325 MG tablet ?Commonly known as: TYLENOL ?Take 650 mg by mouth every 6 (six) hours as needed. ?  ?Calcium 1200 1200-1000 MG-UNIT Chew ?Chew 1 tablet by mouth daily. ?  ?cefPROZIL 500 MG tablet ?Commonly known as: CEFZIL ?Take 1 tablet (500 mg total) by mouth 2 (two) times daily. For infection. Take all of this medication. ?Started by: Claretta Fraise, MD ?  ?cholecalciferol 25 MCG (1000 UNIT) tablet ?Commonly known as: VITAMIN D3 ?Take 1,000 Units by mouth daily. ?  ?gabapentin 300 MG capsule ?Commonly known as: NEURONTIN ?Take 3 capsules (900 mg total) by mouth at bedtime. ?  ?HYDROcodone-acetaminophen 5-325 MG tablet ?Commonly known as: Norco ?Take 1 tablet by mouth every 4 (four) hours as needed for moderate pain. Take for moderate to severe pain ?Started by: Claretta Fraise, MD ?  ?ipratropium-albuterol 0.5-2.5 (3) MG/3ML Soln ?Commonly known as: DUONEB ?Take 3 mLs by nebulization every 4 (four) hours as needed. INHALE CONTENTS OF 1 VIAL VIA NEBULIZER EVERY 4 HOURS AS NEEDED ?  ?losartan 25 MG tablet ?Commonly known as: COZAAR ?Take 1 tablet (25 mg total) by mouth daily. ?  ?predniSONE 5 MG tablet ?Commonly known as:  DELTASONE ?Take 3 tablets (15 mg total) by mouth daily with breakfast. J44.9 ?  ?Spiriva Respimat 2.5 MCG/ACT Aers ?Generic drug: Tiotropium Bromide Monohydrate ?Inhale 2 puffs into the lungs daily. ?  ?traMADol 50 MG tablet ?Commonly known as: ULTRAM ?Take 1 tablet (50 mg total) by mouth every 12 (twelve) hours as needed. ?  ? ?  ? ? ? ?Follow-up: Return in about 2 weeks (around 03/19/2022). ? ?Claretta Fraise, M.D. ?

## 2022-03-06 ENCOUNTER — Ambulatory Visit (HOSPITAL_COMMUNITY)
Admission: RE | Admit: 2022-03-06 | Discharge: 2022-03-06 | Disposition: A | Discharge status: Home / Self Care | Payer: Medicare HMO | Source: Ambulatory Visit | Attending: Family Medicine | Admitting: Family Medicine

## 2022-03-06 DIAGNOSIS — J441 Chronic obstructive pulmonary disease with (acute) exacerbation: Secondary | ICD-10-CM | POA: Insufficient documentation

## 2022-03-06 DIAGNOSIS — J449 Chronic obstructive pulmonary disease, unspecified: Secondary | ICD-10-CM | POA: Diagnosis not present

## 2022-03-06 DIAGNOSIS — R911 Solitary pulmonary nodule: Secondary | ICD-10-CM | POA: Diagnosis not present

## 2022-03-06 DIAGNOSIS — J189 Pneumonia, unspecified organism: Secondary | ICD-10-CM | POA: Diagnosis not present

## 2022-03-06 DIAGNOSIS — J479 Bronchiectasis, uncomplicated: Secondary | ICD-10-CM | POA: Diagnosis not present

## 2022-03-06 LAB — CBC WITH DIFFERENTIAL/PLATELET
Basophils Absolute: 0 10*3/uL (ref 0.0–0.2)
Basos: 0 %
EOS (ABSOLUTE): 0 10*3/uL (ref 0.0–0.4)
Eos: 0 %
Hematocrit: 37.3 % (ref 34.0–46.6)
Hemoglobin: 12.2 g/dL (ref 11.1–15.9)
Immature Grans (Abs): 0.1 10*3/uL (ref 0.0–0.1)
Immature Granulocytes: 1 %
Lymphocytes Absolute: 0.6 10*3/uL — ABNORMAL LOW (ref 0.7–3.1)
Lymphs: 4 %
MCH: 28.5 pg (ref 26.6–33.0)
MCHC: 32.7 g/dL (ref 31.5–35.7)
MCV: 87 fL (ref 79–97)
Monocytes Absolute: 0.6 10*3/uL (ref 0.1–0.9)
Monocytes: 4 %
Neutrophils Absolute: 12.5 10*3/uL — ABNORMAL HIGH (ref 1.4–7.0)
Neutrophils: 91 %
Platelets: 476 10*3/uL — ABNORMAL HIGH (ref 150–450)
RBC: 4.28 x10E6/uL (ref 3.77–5.28)
RDW: 12 % (ref 11.7–15.4)
WBC: 13.8 10*3/uL — ABNORMAL HIGH (ref 3.4–10.8)

## 2022-03-06 LAB — CMP14+EGFR
ALT: 8 IU/L (ref 0–32)
AST: 13 IU/L (ref 0–40)
Albumin/Globulin Ratio: 1.3 (ref 1.2–2.2)
Albumin: 3.5 g/dL — ABNORMAL LOW (ref 3.7–4.7)
Alkaline Phosphatase: 99 IU/L (ref 44–121)
BUN/Creatinine Ratio: 14 (ref 12–28)
BUN: 10 mg/dL (ref 8–27)
Bilirubin Total: <0.2 mg/dL (ref 0.0–1.2)
CO2: 29 mmol/L (ref 20–29)
Calcium: 9.6 mg/dL (ref 8.7–10.3)
Chloride: 90 mmol/L — ABNORMAL LOW (ref 96–106)
Creatinine, Ser: 0.71 mg/dL (ref 0.57–1.00)
Globulin, Total: 2.6 g/dL (ref 1.5–4.5)
Glucose: 120 mg/dL — ABNORMAL HIGH (ref 70–99)
Potassium: 5 mmol/L (ref 3.5–5.2)
Sodium: 136 mmol/L (ref 134–144)
Total Protein: 6.1 g/dL (ref 6.0–8.5)
eGFR: 88 mL/min/{1.73_m2} (ref >59)

## 2022-03-06 LAB — LIPID PANEL
Chol/HDL Ratio: 2.3 ratio (ref 0.0–4.4)
Cholesterol, Total: 174 mg/dL (ref 100–199)
HDL: 76 mg/dL (ref >39)
LDL Chol Calc (NIH): 82 mg/dL (ref 0–99)
Triglycerides: 87 mg/dL (ref 0–149)
VLDL Cholesterol Cal: 16 mg/dL (ref 5–40)

## 2022-03-07 ENCOUNTER — Other Ambulatory Visit: Payer: Self-pay | Admitting: Family Medicine

## 2022-03-07 DIAGNOSIS — R911 Solitary pulmonary nodule: Secondary | ICD-10-CM | POA: Insufficient documentation

## 2022-03-07 DIAGNOSIS — J9611 Chronic respiratory failure with hypoxia: Secondary | ICD-10-CM

## 2022-03-07 DIAGNOSIS — J449 Chronic obstructive pulmonary disease, unspecified: Secondary | ICD-10-CM

## 2022-03-07 DIAGNOSIS — J471 Bronchiectasis with (acute) exacerbation: Secondary | ICD-10-CM

## 2022-03-10 DIAGNOSIS — Z66 Do not resuscitate: Secondary | ICD-10-CM | POA: Diagnosis not present

## 2022-03-10 DIAGNOSIS — Z20822 Contact with and (suspected) exposure to covid-19: Secondary | ICD-10-CM | POA: Diagnosis not present

## 2022-03-10 DIAGNOSIS — Z881 Allergy status to other antibiotic agents status: Secondary | ICD-10-CM | POA: Diagnosis not present

## 2022-03-10 DIAGNOSIS — Z7951 Long term (current) use of inhaled steroids: Secondary | ICD-10-CM | POA: Diagnosis not present

## 2022-03-10 DIAGNOSIS — I700 Atherosclerosis of aorta: Secondary | ICD-10-CM | POA: Diagnosis not present

## 2022-03-10 DIAGNOSIS — I10 Essential (primary) hypertension: Secondary | ICD-10-CM | POA: Diagnosis not present

## 2022-03-10 DIAGNOSIS — Z792 Long term (current) use of antibiotics: Secondary | ICD-10-CM | POA: Diagnosis not present

## 2022-03-10 DIAGNOSIS — J440 Chronic obstructive pulmonary disease with acute lower respiratory infection: Secondary | ICD-10-CM | POA: Diagnosis not present

## 2022-03-10 DIAGNOSIS — R000 Tachycardia, unspecified: Secondary | ICD-10-CM | POA: Diagnosis not present

## 2022-03-10 DIAGNOSIS — Z609 Problem related to social environment, unspecified: Secondary | ICD-10-CM | POA: Diagnosis not present

## 2022-03-10 DIAGNOSIS — R0600 Dyspnea, unspecified: Secondary | ICD-10-CM | POA: Diagnosis not present

## 2022-03-10 DIAGNOSIS — I272 Pulmonary hypertension, unspecified: Secondary | ICD-10-CM | POA: Diagnosis not present

## 2022-03-10 DIAGNOSIS — Z9981 Dependence on supplemental oxygen: Secondary | ICD-10-CM | POA: Diagnosis not present

## 2022-03-10 DIAGNOSIS — Z7952 Long term (current) use of systemic steroids: Secondary | ICD-10-CM | POA: Diagnosis not present

## 2022-03-10 DIAGNOSIS — J189 Pneumonia, unspecified organism: Secondary | ICD-10-CM | POA: Diagnosis not present

## 2022-03-10 DIAGNOSIS — R062 Wheezing: Secondary | ICD-10-CM | POA: Diagnosis not present

## 2022-03-10 DIAGNOSIS — Z87891 Personal history of nicotine dependence: Secondary | ICD-10-CM | POA: Diagnosis not present

## 2022-03-10 DIAGNOSIS — J479 Bronchiectasis, uncomplicated: Secondary | ICD-10-CM | POA: Diagnosis not present

## 2022-03-10 DIAGNOSIS — R0689 Other abnormalities of breathing: Secondary | ICD-10-CM | POA: Diagnosis not present

## 2022-03-10 DIAGNOSIS — J80 Acute respiratory distress syndrome: Secondary | ICD-10-CM | POA: Diagnosis not present

## 2022-03-10 DIAGNOSIS — R918 Other nonspecific abnormal finding of lung field: Secondary | ICD-10-CM | POA: Diagnosis not present

## 2022-03-10 DIAGNOSIS — Z79899 Other long term (current) drug therapy: Secondary | ICD-10-CM | POA: Diagnosis not present

## 2022-03-10 DIAGNOSIS — J181 Lobar pneumonia, unspecified organism: Secondary | ICD-10-CM | POA: Diagnosis not present

## 2022-03-10 DIAGNOSIS — I517 Cardiomegaly: Secondary | ICD-10-CM | POA: Diagnosis not present

## 2022-03-10 DIAGNOSIS — R0602 Shortness of breath: Secondary | ICD-10-CM | POA: Diagnosis not present

## 2022-03-10 DIAGNOSIS — J441 Chronic obstructive pulmonary disease with (acute) exacerbation: Secondary | ICD-10-CM | POA: Diagnosis not present

## 2022-03-10 DIAGNOSIS — J9611 Chronic respiratory failure with hypoxia: Secondary | ICD-10-CM | POA: Diagnosis not present

## 2022-03-17 ENCOUNTER — Telehealth: Payer: Self-pay | Admitting: Family Medicine

## 2022-03-19 ENCOUNTER — Other Ambulatory Visit: Payer: Self-pay | Admitting: Family Medicine

## 2022-03-19 DIAGNOSIS — J449 Chronic obstructive pulmonary disease, unspecified: Secondary | ICD-10-CM

## 2022-03-20 NOTE — Telephone Encounter (Signed)
Remaining refills from 10/15/21 4g w/ 11 refills sent ?

## 2022-03-24 ENCOUNTER — Ambulatory Visit: Payer: Medicare HMO | Admitting: Family Medicine

## 2022-03-25 ENCOUNTER — Telehealth: Payer: Self-pay | Admitting: Family Medicine

## 2022-03-25 DIAGNOSIS — J9621 Acute and chronic respiratory failure with hypoxia: Secondary | ICD-10-CM | POA: Diagnosis not present

## 2022-03-25 DIAGNOSIS — E440 Moderate protein-calorie malnutrition: Secondary | ICD-10-CM | POA: Diagnosis not present

## 2022-03-25 DIAGNOSIS — J9622 Acute and chronic respiratory failure with hypercapnia: Secondary | ICD-10-CM | POA: Diagnosis not present

## 2022-03-25 DIAGNOSIS — R0902 Hypoxemia: Secondary | ICD-10-CM | POA: Diagnosis not present

## 2022-03-25 DIAGNOSIS — J479 Bronchiectasis, uncomplicated: Secondary | ICD-10-CM | POA: Diagnosis not present

## 2022-03-25 DIAGNOSIS — J440 Chronic obstructive pulmonary disease with acute lower respiratory infection: Secondary | ICD-10-CM | POA: Diagnosis not present

## 2022-03-25 DIAGNOSIS — J9602 Acute respiratory failure with hypercapnia: Secondary | ICD-10-CM | POA: Diagnosis not present

## 2022-03-25 DIAGNOSIS — J189 Pneumonia, unspecified organism: Secondary | ICD-10-CM | POA: Diagnosis not present

## 2022-03-25 DIAGNOSIS — Z881 Allergy status to other antibiotic agents status: Secondary | ICD-10-CM | POA: Diagnosis not present

## 2022-03-25 DIAGNOSIS — J449 Chronic obstructive pulmonary disease, unspecified: Secondary | ICD-10-CM | POA: Diagnosis not present

## 2022-03-25 DIAGNOSIS — R0600 Dyspnea, unspecified: Secondary | ICD-10-CM | POA: Diagnosis not present

## 2022-03-25 DIAGNOSIS — Z79899 Other long term (current) drug therapy: Secondary | ICD-10-CM | POA: Diagnosis not present

## 2022-03-25 DIAGNOSIS — Z66 Do not resuscitate: Secondary | ICD-10-CM | POA: Diagnosis not present

## 2022-03-25 DIAGNOSIS — R000 Tachycardia, unspecified: Secondary | ICD-10-CM | POA: Diagnosis not present

## 2022-03-25 DIAGNOSIS — R4182 Altered mental status, unspecified: Secondary | ICD-10-CM | POA: Diagnosis not present

## 2022-03-25 DIAGNOSIS — J9601 Acute respiratory failure with hypoxia: Secondary | ICD-10-CM | POA: Diagnosis not present

## 2022-03-25 DIAGNOSIS — Z9981 Dependence on supplemental oxygen: Secondary | ICD-10-CM | POA: Diagnosis not present

## 2022-03-25 DIAGNOSIS — I10 Essential (primary) hypertension: Secondary | ICD-10-CM | POA: Diagnosis not present

## 2022-03-25 DIAGNOSIS — J80 Acute respiratory distress syndrome: Secondary | ICD-10-CM | POA: Diagnosis not present

## 2022-03-26 DIAGNOSIS — R52 Pain, unspecified: Secondary | ICD-10-CM | POA: Diagnosis not present

## 2022-03-26 DIAGNOSIS — J984 Other disorders of lung: Secondary | ICD-10-CM | POA: Diagnosis not present

## 2022-03-26 DIAGNOSIS — J9622 Acute and chronic respiratory failure with hypercapnia: Secondary | ICD-10-CM | POA: Diagnosis not present

## 2022-03-26 DIAGNOSIS — Z7189 Other specified counseling: Secondary | ICD-10-CM | POA: Diagnosis not present

## 2022-03-26 DIAGNOSIS — J470 Bronchiectasis with acute lower respiratory infection: Secondary | ICD-10-CM | POA: Diagnosis not present

## 2022-03-26 DIAGNOSIS — J180 Bronchopneumonia, unspecified organism: Secondary | ICD-10-CM | POA: Diagnosis not present

## 2022-03-26 DIAGNOSIS — I482 Chronic atrial fibrillation, unspecified: Secondary | ICD-10-CM | POA: Diagnosis not present

## 2022-03-26 DIAGNOSIS — J441 Chronic obstructive pulmonary disease with (acute) exacerbation: Secondary | ICD-10-CM | POA: Diagnosis not present

## 2022-03-26 DIAGNOSIS — J9612 Chronic respiratory failure with hypercapnia: Secondary | ICD-10-CM | POA: Diagnosis not present

## 2022-03-26 DIAGNOSIS — J90 Pleural effusion, not elsewhere classified: Secondary | ICD-10-CM | POA: Diagnosis not present

## 2022-03-26 DIAGNOSIS — J156 Pneumonia due to other aerobic Gram-negative bacteria: Secondary | ICD-10-CM | POA: Diagnosis not present

## 2022-03-26 DIAGNOSIS — J219 Acute bronchiolitis, unspecified: Secondary | ICD-10-CM | POA: Diagnosis not present

## 2022-03-26 DIAGNOSIS — J9690 Respiratory failure, unspecified, unspecified whether with hypoxia or hypercapnia: Secondary | ICD-10-CM | POA: Diagnosis not present

## 2022-03-26 DIAGNOSIS — E870 Hyperosmolality and hypernatremia: Secondary | ICD-10-CM | POA: Diagnosis not present

## 2022-03-26 DIAGNOSIS — R918 Other nonspecific abnormal finding of lung field: Secondary | ICD-10-CM | POA: Diagnosis not present

## 2022-03-26 DIAGNOSIS — J440 Chronic obstructive pulmonary disease with acute lower respiratory infection: Secondary | ICD-10-CM | POA: Diagnosis not present

## 2022-03-26 DIAGNOSIS — A419 Sepsis, unspecified organism: Secondary | ICD-10-CM | POA: Diagnosis not present

## 2022-03-26 DIAGNOSIS — Z789 Other specified health status: Secondary | ICD-10-CM | POA: Diagnosis not present

## 2022-03-26 DIAGNOSIS — G9341 Metabolic encephalopathy: Secondary | ICD-10-CM | POA: Diagnosis not present

## 2022-03-26 DIAGNOSIS — M810 Age-related osteoporosis without current pathological fracture: Secondary | ICD-10-CM | POA: Diagnosis not present

## 2022-03-26 DIAGNOSIS — Z515 Encounter for palliative care: Secondary | ICD-10-CM | POA: Diagnosis not present

## 2022-03-26 DIAGNOSIS — B0229 Other postherpetic nervous system involvement: Secondary | ICD-10-CM | POA: Diagnosis not present

## 2022-03-26 DIAGNOSIS — R32 Unspecified urinary incontinence: Secondary | ICD-10-CM | POA: Diagnosis not present

## 2022-03-26 DIAGNOSIS — I10 Essential (primary) hypertension: Secondary | ICD-10-CM | POA: Diagnosis not present

## 2022-03-26 DIAGNOSIS — J9621 Acute and chronic respiratory failure with hypoxia: Secondary | ICD-10-CM | POA: Diagnosis not present

## 2022-03-26 DIAGNOSIS — G8929 Other chronic pain: Secondary | ICD-10-CM | POA: Diagnosis not present

## 2022-03-26 DIAGNOSIS — M1990 Unspecified osteoarthritis, unspecified site: Secondary | ICD-10-CM | POA: Diagnosis not present

## 2022-03-26 DIAGNOSIS — H9113 Presbycusis, bilateral: Secondary | ICD-10-CM | POA: Diagnosis not present

## 2022-03-26 DIAGNOSIS — R4182 Altered mental status, unspecified: Secondary | ICD-10-CM | POA: Diagnosis not present

## 2022-03-26 DIAGNOSIS — I082 Rheumatic disorders of both aortic and tricuspid valves: Secondary | ICD-10-CM | POA: Diagnosis not present

## 2022-03-27 ENCOUNTER — Ambulatory Visit (INDEPENDENT_AMBULATORY_CARE_PROVIDER_SITE_OTHER): Payer: Medicare HMO

## 2022-03-27 DIAGNOSIS — I10 Essential (primary) hypertension: Secondary | ICD-10-CM

## 2022-03-27 DIAGNOSIS — G8929 Other chronic pain: Secondary | ICD-10-CM

## 2022-03-27 DIAGNOSIS — J9612 Chronic respiratory failure with hypercapnia: Secondary | ICD-10-CM

## 2022-03-27 DIAGNOSIS — H9113 Presbycusis, bilateral: Secondary | ICD-10-CM | POA: Diagnosis not present

## 2022-03-27 DIAGNOSIS — J441 Chronic obstructive pulmonary disease with (acute) exacerbation: Secondary | ICD-10-CM

## 2022-03-27 DIAGNOSIS — J9621 Acute and chronic respiratory failure with hypoxia: Secondary | ICD-10-CM | POA: Diagnosis not present

## 2022-03-27 DIAGNOSIS — M810 Age-related osteoporosis without current pathological fracture: Secondary | ICD-10-CM

## 2022-03-27 DIAGNOSIS — J9622 Acute and chronic respiratory failure with hypercapnia: Secondary | ICD-10-CM | POA: Diagnosis not present

## 2022-03-27 DIAGNOSIS — J180 Bronchopneumonia, unspecified organism: Secondary | ICD-10-CM | POA: Diagnosis not present

## 2022-03-27 DIAGNOSIS — M1990 Unspecified osteoarthritis, unspecified site: Secondary | ICD-10-CM | POA: Diagnosis not present

## 2022-03-27 DIAGNOSIS — J440 Chronic obstructive pulmonary disease with acute lower respiratory infection: Secondary | ICD-10-CM

## 2022-03-27 DIAGNOSIS — R32 Unspecified urinary incontinence: Secondary | ICD-10-CM

## 2022-03-28 DIAGNOSIS — J441 Chronic obstructive pulmonary disease with (acute) exacerbation: Secondary | ICD-10-CM | POA: Diagnosis not present

## 2022-03-28 DIAGNOSIS — Z7189 Other specified counseling: Secondary | ICD-10-CM | POA: Diagnosis not present

## 2022-03-28 DIAGNOSIS — J9622 Acute and chronic respiratory failure with hypercapnia: Secondary | ICD-10-CM | POA: Diagnosis not present

## 2022-03-28 DIAGNOSIS — Z789 Other specified health status: Secondary | ICD-10-CM | POA: Diagnosis not present

## 2022-03-28 DIAGNOSIS — J9621 Acute and chronic respiratory failure with hypoxia: Secondary | ICD-10-CM | POA: Diagnosis not present

## 2022-03-28 DIAGNOSIS — Z515 Encounter for palliative care: Secondary | ICD-10-CM | POA: Diagnosis not present

## 2022-03-28 DIAGNOSIS — I10 Essential (primary) hypertension: Secondary | ICD-10-CM | POA: Diagnosis not present

## 2022-03-28 DIAGNOSIS — R52 Pain, unspecified: Secondary | ICD-10-CM | POA: Diagnosis not present

## 2022-03-29 DIAGNOSIS — J9622 Acute and chronic respiratory failure with hypercapnia: Secondary | ICD-10-CM | POA: Diagnosis not present

## 2022-03-29 DIAGNOSIS — Z515 Encounter for palliative care: Secondary | ICD-10-CM | POA: Diagnosis not present

## 2022-03-29 DIAGNOSIS — J9621 Acute and chronic respiratory failure with hypoxia: Secondary | ICD-10-CM | POA: Diagnosis not present

## 2022-03-29 DIAGNOSIS — Z7189 Other specified counseling: Secondary | ICD-10-CM | POA: Diagnosis not present

## 2022-03-29 DIAGNOSIS — R52 Pain, unspecified: Secondary | ICD-10-CM | POA: Diagnosis not present

## 2022-03-29 DIAGNOSIS — Z789 Other specified health status: Secondary | ICD-10-CM | POA: Diagnosis not present

## 2022-03-29 DIAGNOSIS — I10 Essential (primary) hypertension: Secondary | ICD-10-CM | POA: Diagnosis not present

## 2022-03-29 DIAGNOSIS — J441 Chronic obstructive pulmonary disease with (acute) exacerbation: Secondary | ICD-10-CM | POA: Diagnosis not present

## 2022-04-03 ENCOUNTER — Institutional Professional Consult (permissible substitution): Payer: Self-pay | Admitting: Internal Medicine

## 2022-04-09 ENCOUNTER — Ambulatory Visit: Payer: Medicare HMO | Admitting: Family Medicine

## 2022-04-24 DEATH — deceased

## 2022-10-03 IMAGING — DX DG CHEST 1V PORT
1 series · 1 of 1 positions shown · non-contrast
Comparison: 08/18/2019

CLINICAL DATA: Short of breath since yesterday, lower back pain

EXAM:
PORTABLE CHEST 1 VIEW

[chest ap]
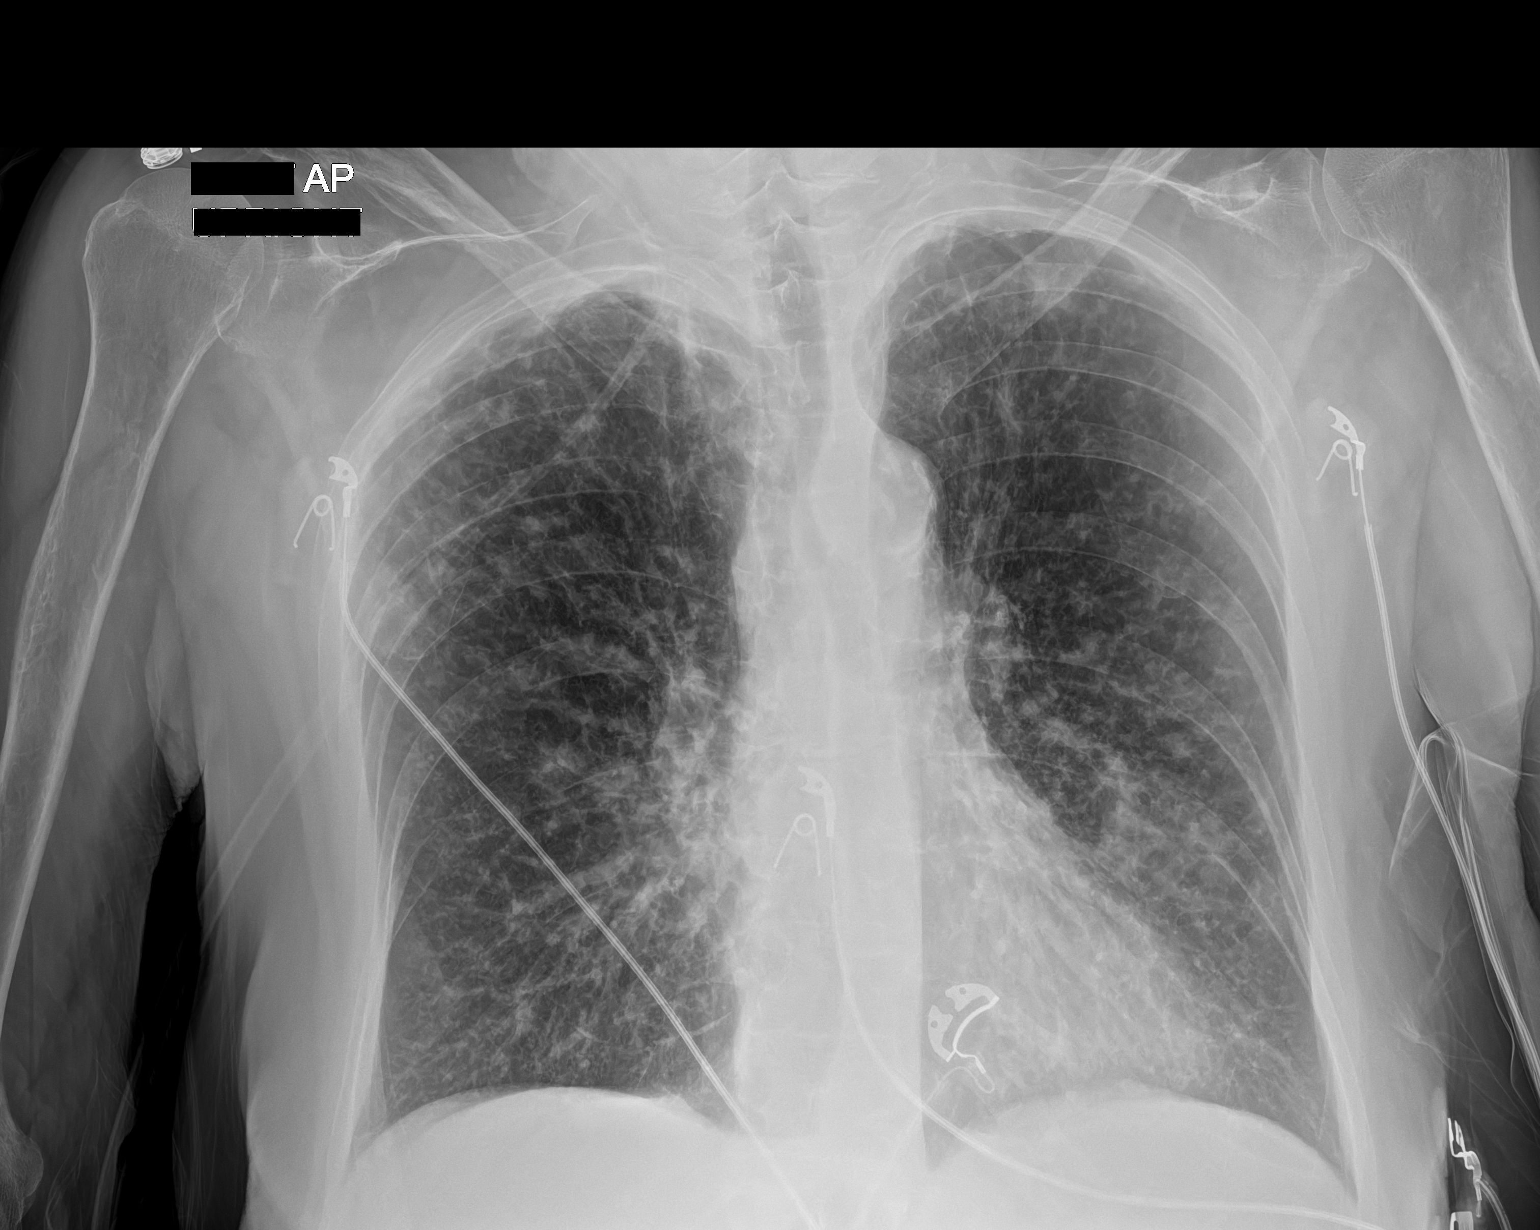

[1 of 1 positions shown; findings below may reference images not displayed]

FINDINGS: Single frontal view of the chest demonstrates a stable cardiac
silhouette. Diffuse interstitial prominence and bilateral nodularity
are stable, consistent with sequela of chronic postinflammatory
change or chronic indolent infection such as SIAU LONG. No acute airspace
disease, effusion, or pneumothorax. No acute bony abnormalities.
IMPRESSION: 1. Chronic parenchymal scarring, most consistent with
postinflammatory changes or chronic indolent infection as above.
2. No acute intrathoracic process.

## 2022-10-03 IMAGING — DX DG LUMBAR SPINE COMPLETE 4+V
6 series · 6 of 6 positions shown · non-contrast
Comparison: Lumbar radiograph 04/30/2016, included portion from
chest CT 12/27/2019.

CLINICAL DATA: Lumbosacral back pain.  No known injury.

EXAM:
LUMBAR SPINE - COMPLETE 4+ VIEW

[l-spine obl (1 of 2)]
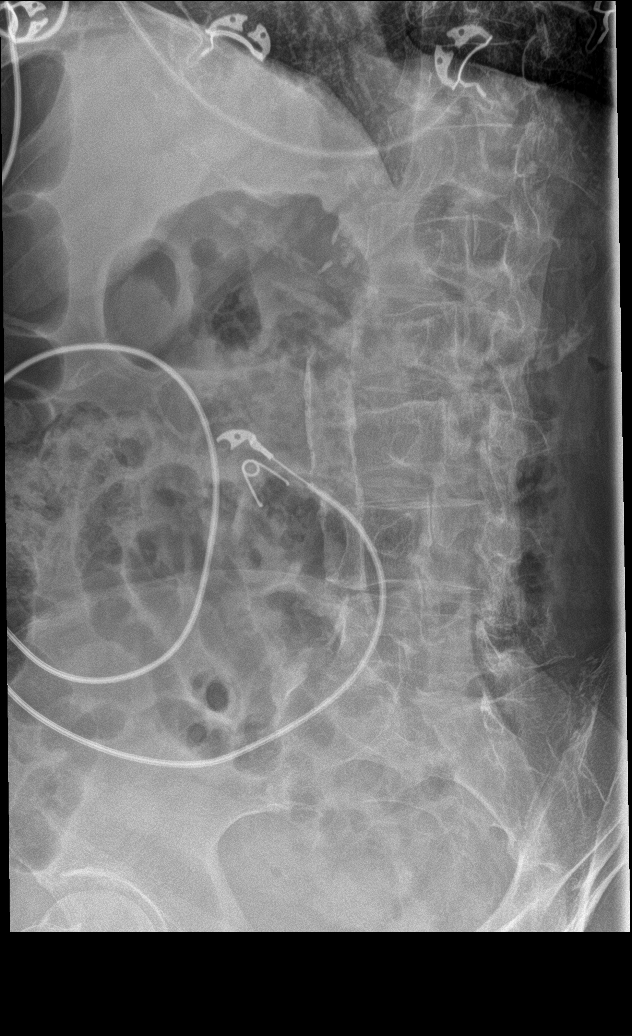

[l-spine obl (2 of 2)]
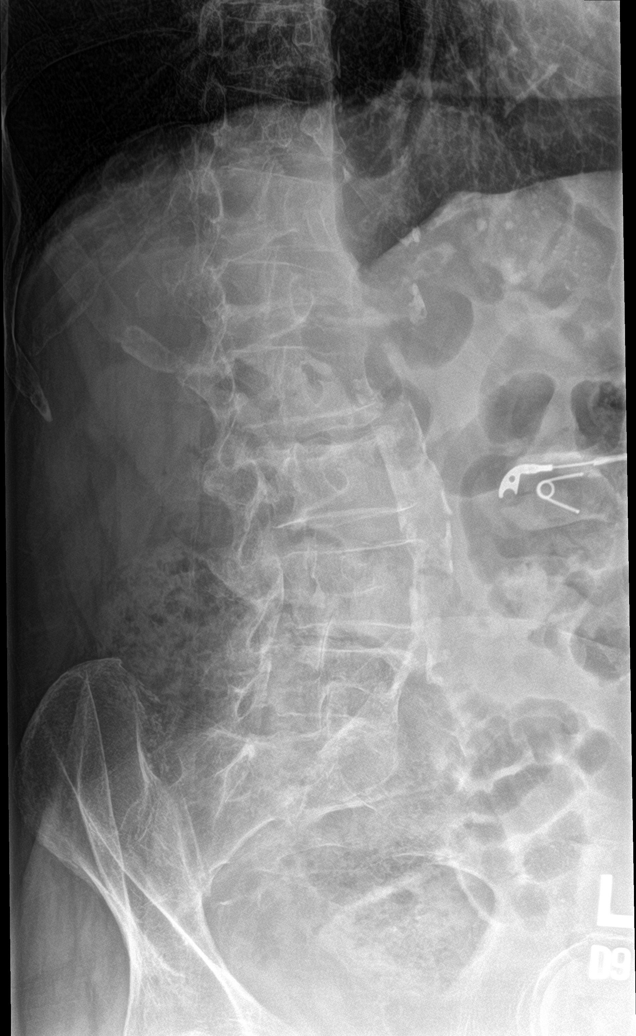

[l-spine lat]
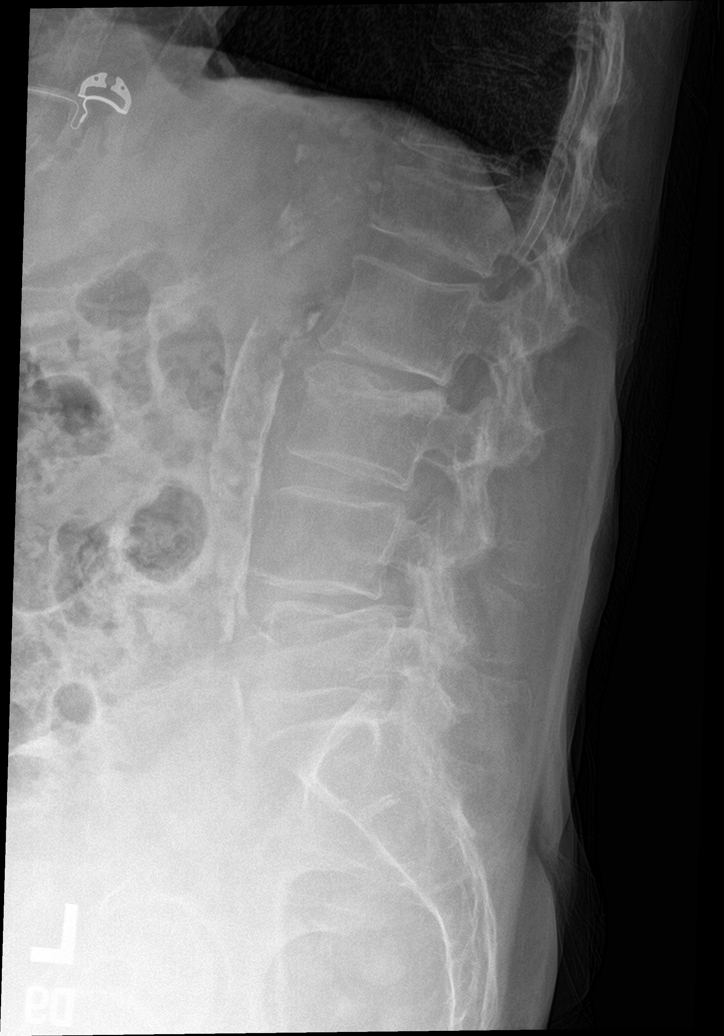

[l-spine spot]
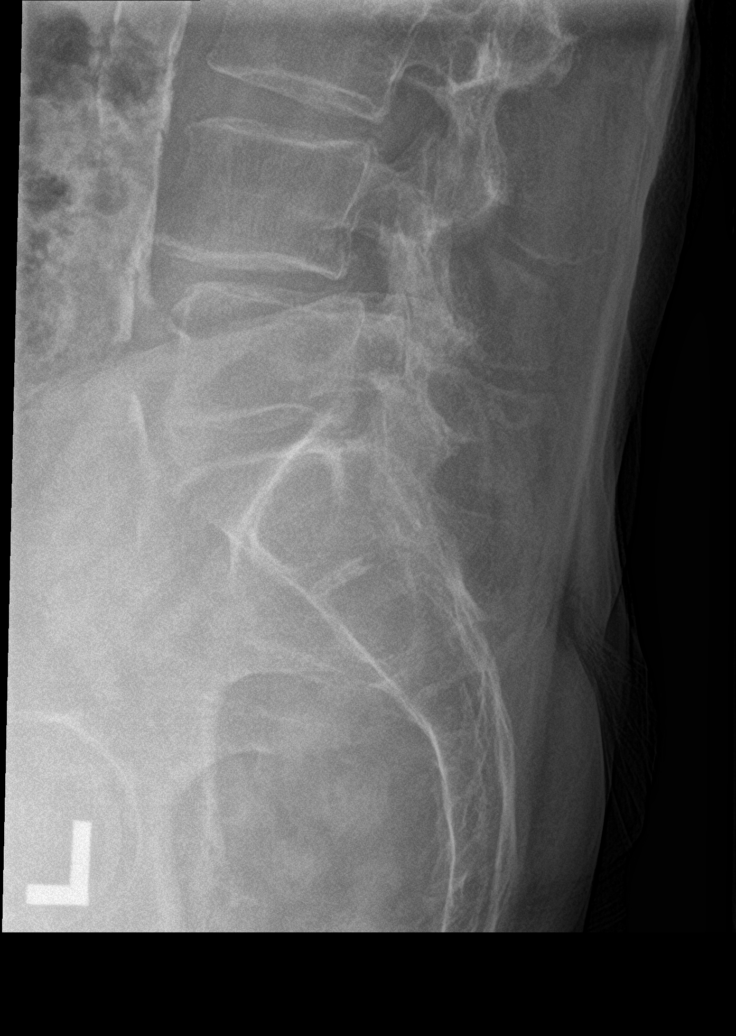

[l-spine ap (1 of 2)]
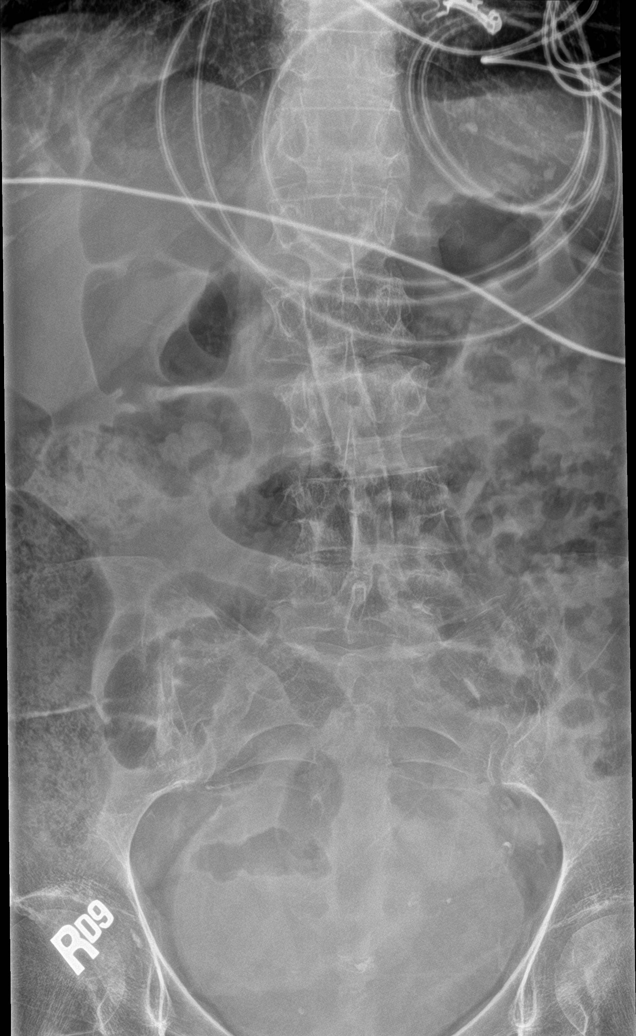

[l-spine ap (2 of 2)]
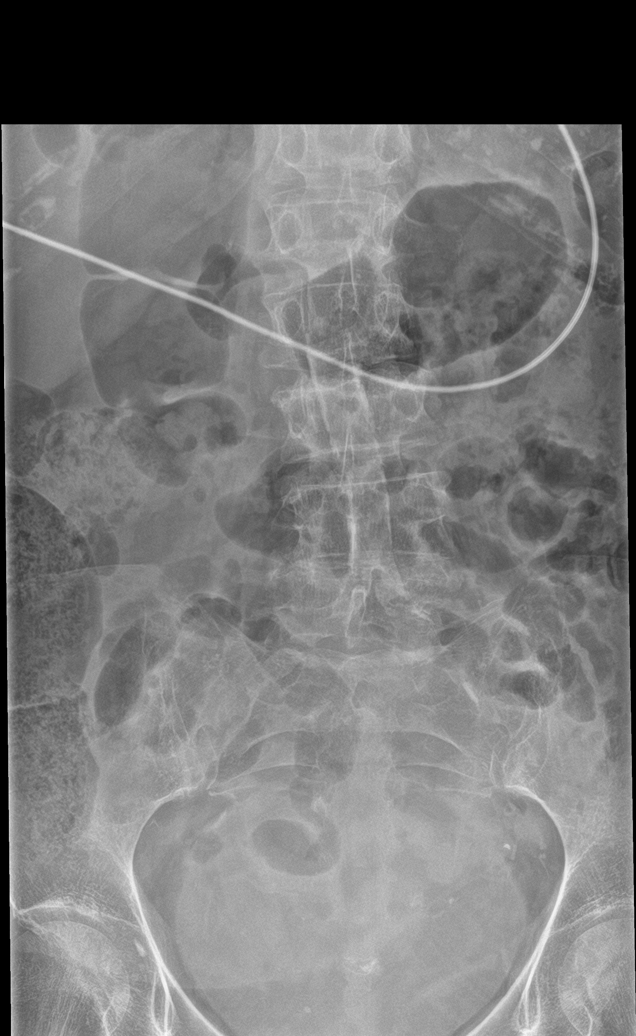

[6 of 6 positions shown; findings below may reference images not displayed]

FINDINGS: L1 compression fracture with approximately 25% loss of height
anteriorly. There is minimal loss of height of L3 superior endplate.
Unchanged 5 mm anterolisthesis of L4 on L5. Endplate spurring at
multiple levels with mild disc space narrowing at L2-L3 and L4-L5.
Lower lumbar facet hypertrophy. Slight dextroscoliotic curvature of
upper lumbar spine. Bones are diffusely under mineralized. The
sacroiliac joints are congruent.
IMPRESSION: 1. L1 compression fracture with approximately 25% loss of height
anteriorly, age indeterminate but new December 2019 chest CT.
2. Minimal loss of height of the L3 superior endplate, suspicious
for compression fracture, age indeterminate.
3. Multilevel degenerative change with mild scoliosis, degenerative
disc disease, and multilevel facet hypertrophy.
